# Patient Record
Sex: Male | Born: 1935 | Hispanic: No | Marital: Married | Smoking: Former smoker
Health system: Southern US, Community
[De-identification: ages and names within clinical notes are randomized; demographics above are authoritative.]

## PROBLEM LIST (undated history)

## (undated) DIAGNOSIS — M722 Plantar fascial fibromatosis: Secondary | ICD-10-CM

## (undated) DIAGNOSIS — Z974 Presence of external hearing-aid: Secondary | ICD-10-CM

## (undated) DIAGNOSIS — D499 Neoplasm of unspecified behavior of unspecified site: Secondary | ICD-10-CM

## (undated) DIAGNOSIS — I447 Left bundle-branch block, unspecified: Secondary | ICD-10-CM

## (undated) DIAGNOSIS — Z8619 Personal history of other infectious and parasitic diseases: Secondary | ICD-10-CM

## (undated) DIAGNOSIS — N529 Male erectile dysfunction, unspecified: Secondary | ICD-10-CM

## (undated) DIAGNOSIS — Z860100 Personal history of colon polyps, unspecified: Secondary | ICD-10-CM

## (undated) DIAGNOSIS — I10 Essential (primary) hypertension: Secondary | ICD-10-CM

## (undated) DIAGNOSIS — Z972 Presence of dental prosthetic device (complete) (partial): Secondary | ICD-10-CM

## (undated) DIAGNOSIS — C801 Malignant (primary) neoplasm, unspecified: Secondary | ICD-10-CM

## (undated) HISTORY — PX: ESOPHAGOGASTRODUODENOSCOPY: SHX1529

## (undated) HISTORY — PX: BLEPHAROPLASTY: SUR158

## (undated) HISTORY — DX: Essential (primary) hypertension: I10

## (undated) HISTORY — DX: Neoplasm of unspecified behavior of unspecified site: D49.9

## (undated) HISTORY — DX: Personal history of other infectious and parasitic diseases: Z86.19

## (undated) HISTORY — DX: Male erectile dysfunction, unspecified: N52.9

---

## 1969-03-20 HISTORY — PX: OTHER SURGICAL HISTORY: SHX169

## 2005-03-20 DIAGNOSIS — C801 Malignant (primary) neoplasm, unspecified: Secondary | ICD-10-CM

## 2005-03-20 HISTORY — DX: Malignant (primary) neoplasm, unspecified: C80.1

## 2005-10-06 ENCOUNTER — Ambulatory Visit: Payer: Self-pay | Admitting: Urology

## 2005-10-10 ENCOUNTER — Ambulatory Visit: Payer: Self-pay | Admitting: Urology

## 2005-11-16 ENCOUNTER — Ambulatory Visit: Payer: Self-pay | Admitting: Radiation Oncology

## 2007-07-19 IMAGING — CR DG LUMBAR SPINE AP/LAT/OBLIQUES W/ FLEX AND EXT
1 series · 5 of 5 positions shown · non-contrast
Comparison: none

REASON FOR EXAM: xray l-spine prostate ca abnormal  bone scan pt need film
COMMENTS:

[Series 1: view not recorded · 0.17mm/px · 5 of 5 slices shown]
[im 1/5]
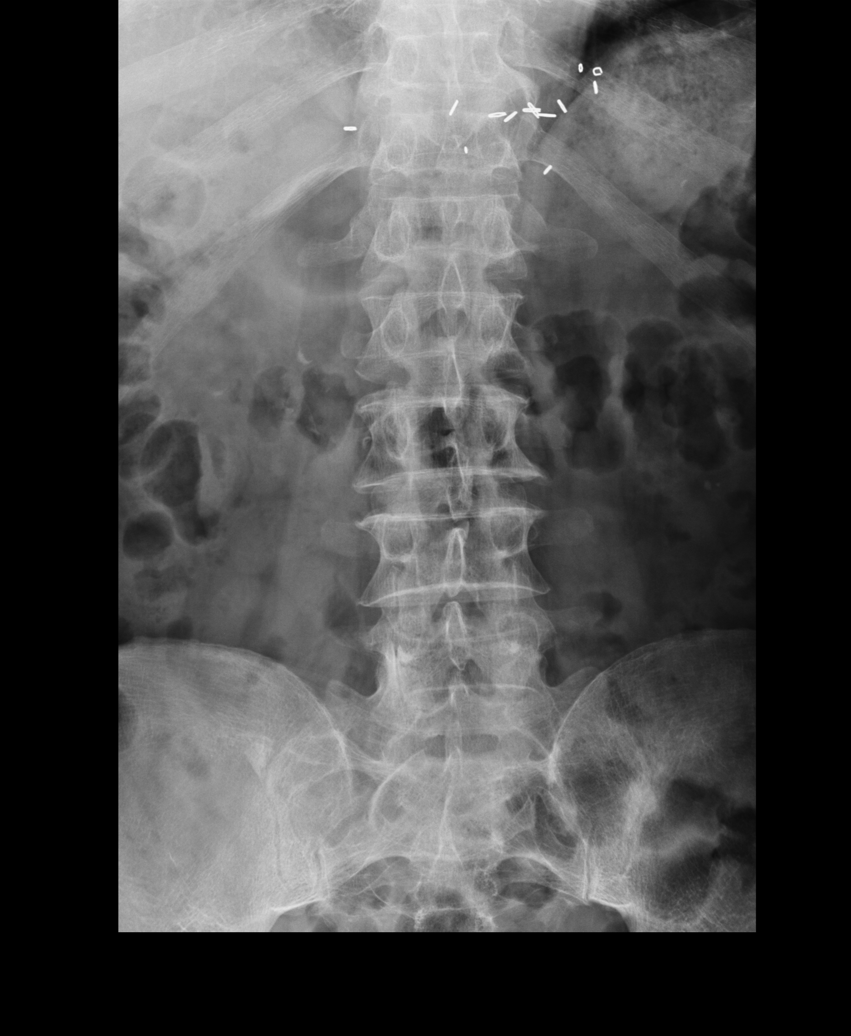
[im 2/5]
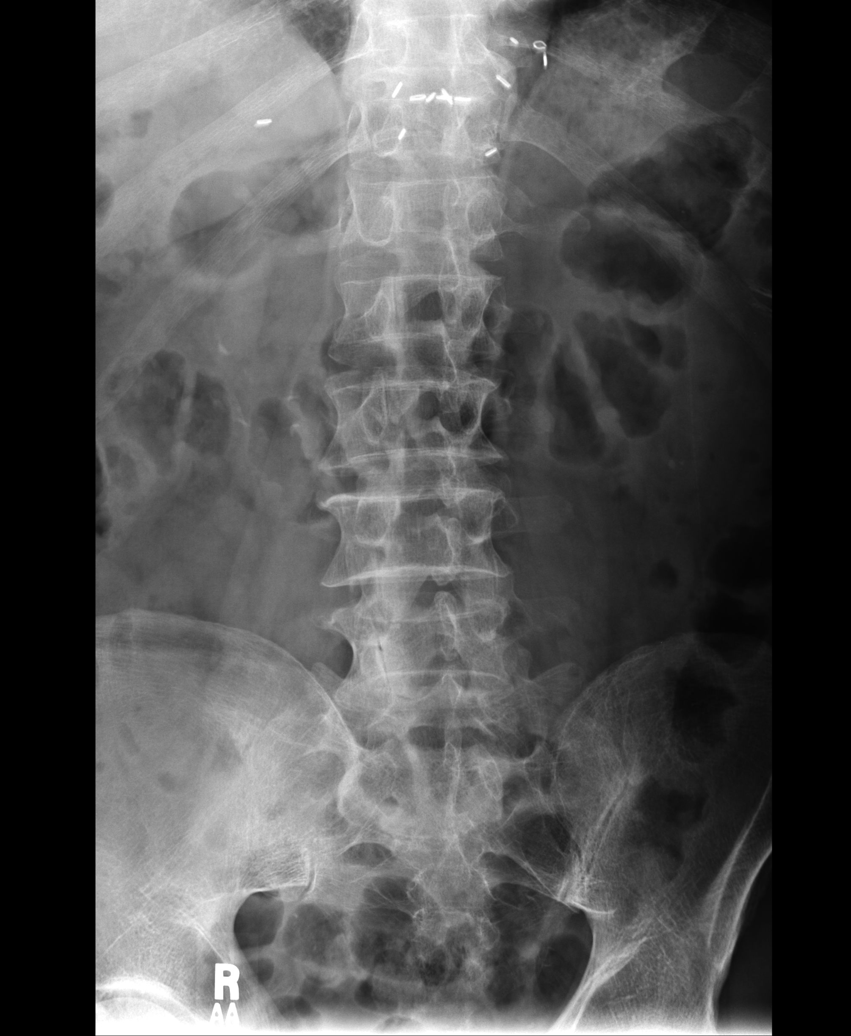
[im 3/5]
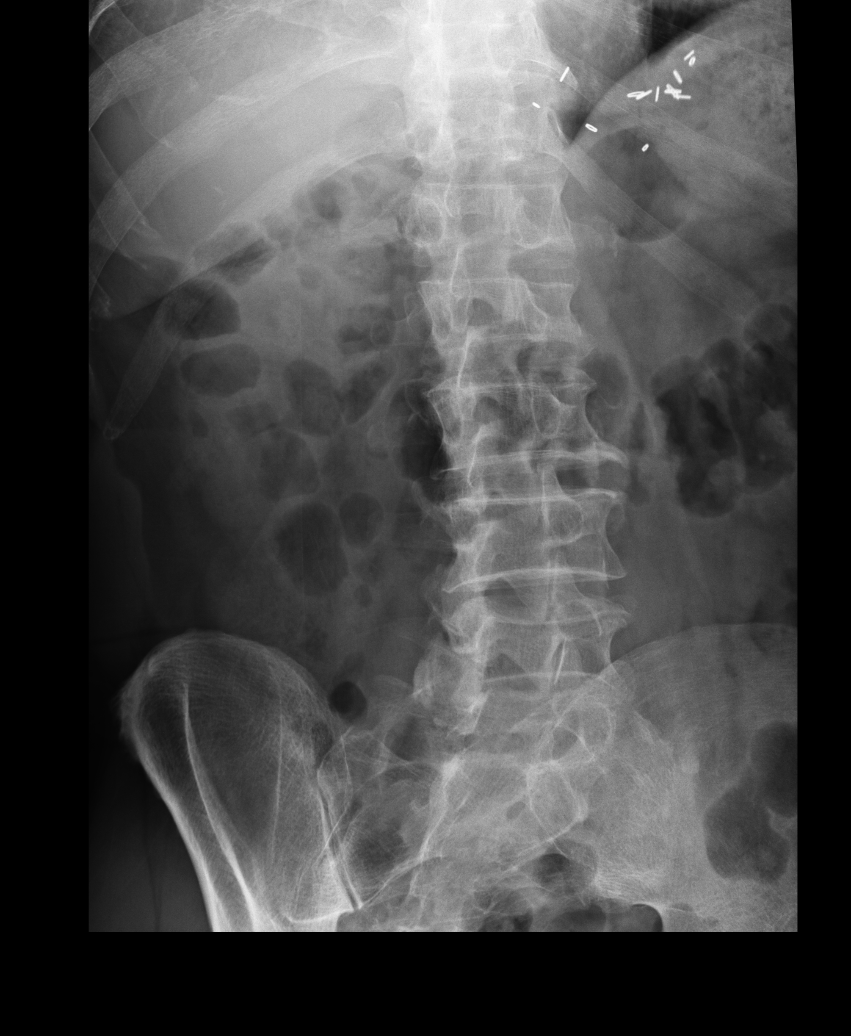
[im 4/5]
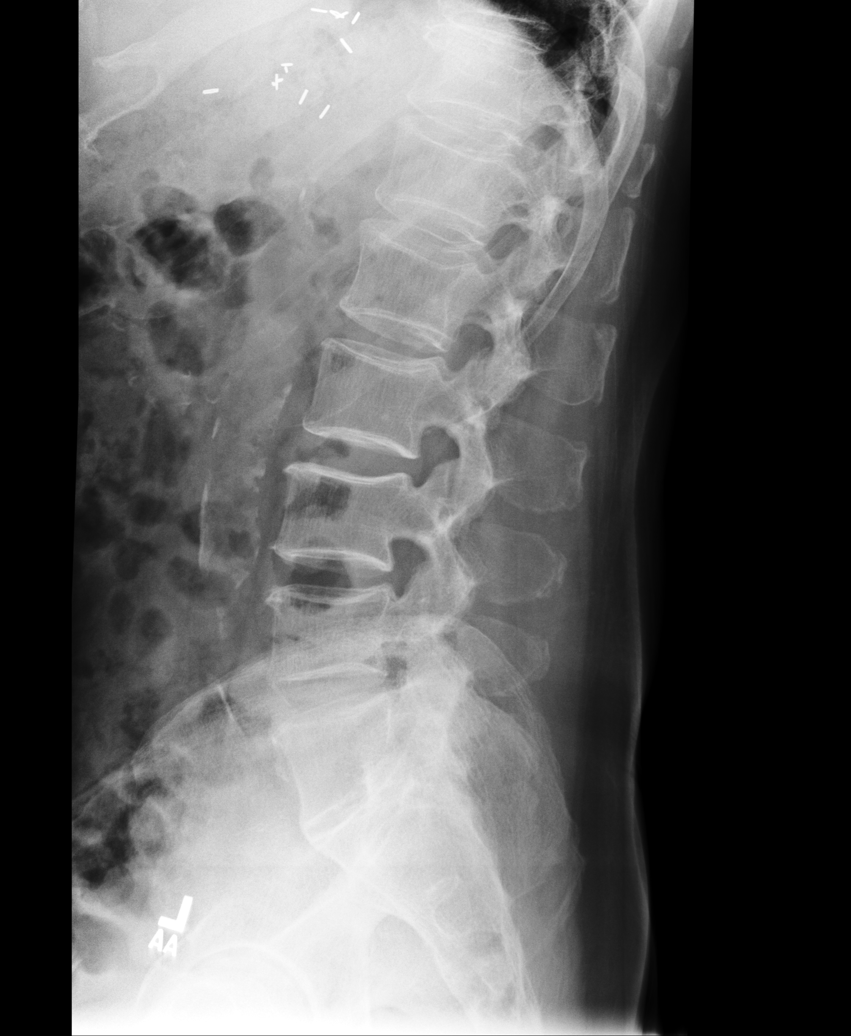
[im 5/5]
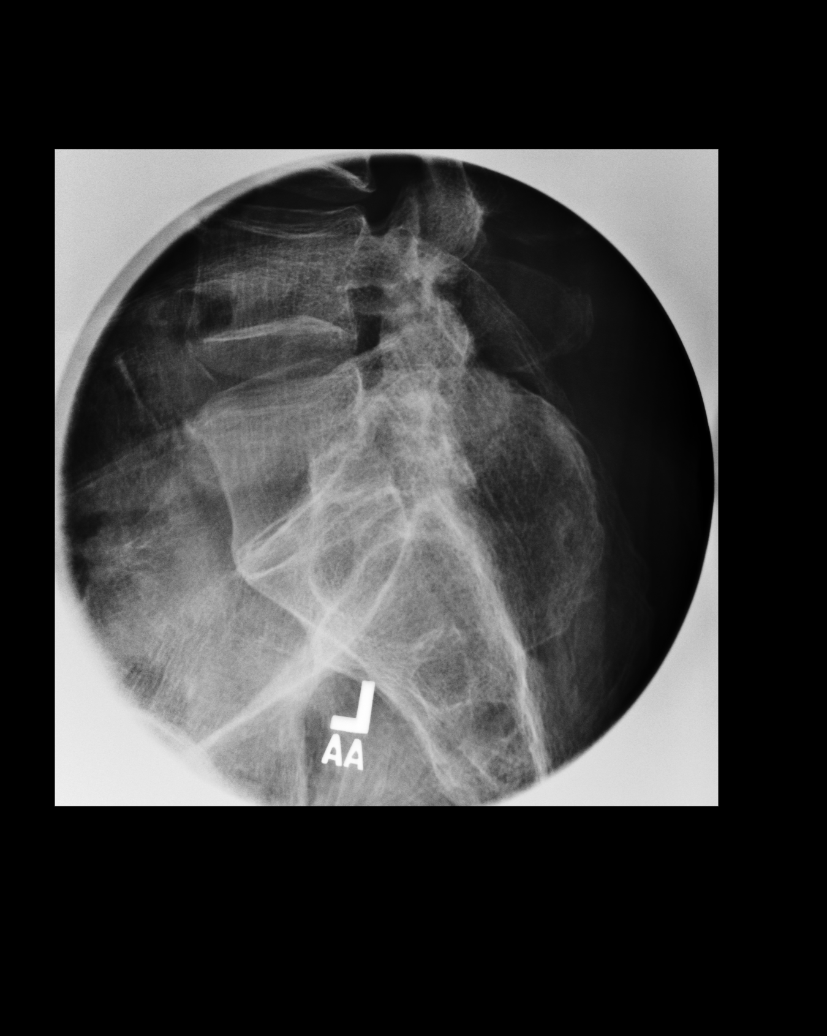

[5 of 5 positions shown; findings below may reference images not displayed]

PROCEDURE:     DXR - DXR LUMBAR SPINE WITH OBLIQUES  - October 10, 2005  [DATE]

RESULT:     AP, lateral and oblique views of the lumbar spine were obtained.
The vertebral body heights and the intervertebral disc spaces are well
maintained.  The vertebral body alignment is normal. There is slight
degenerative spurring anteriorly at multiple levels of the lumbar spine.
Oblique view shows no significant abnormalities of the articular facets.
Particular attention to the L5 level on the RIGHT shows no evidence of lytic
or blastic lesions in the area of increased tracer activity noted on recent
bone scan.
IMPRESSION: 1)No lytic or blastic lesions are identified.

2)There is slight degenerative spurring at multiple levels of the lumbar
spine.

3)No disc space narrowing is identified.

## 2007-09-17 ENCOUNTER — Ambulatory Visit: Payer: Self-pay | Admitting: Gastroenterology

## 2008-03-20 HISTORY — PX: CHOLECYSTECTOMY: SHX55

## 2008-03-20 HISTORY — PX: HERNIA REPAIR: SHX51

## 2008-04-20 HISTORY — PX: COLONOSCOPY: SHX5424

## 2008-05-12 ENCOUNTER — Ambulatory Visit: Payer: Self-pay | Admitting: General Surgery

## 2008-05-12 ENCOUNTER — Ambulatory Visit: Payer: Self-pay | Admitting: Internal Medicine

## 2008-05-13 ENCOUNTER — Inpatient Hospital Stay: Payer: Self-pay | Admitting: General Surgery

## 2008-05-25 ENCOUNTER — Ambulatory Visit: Payer: Self-pay | Admitting: General Surgery

## 2008-06-30 ENCOUNTER — Ambulatory Visit: Payer: Self-pay | Admitting: General Surgery

## 2008-07-03 ENCOUNTER — Ambulatory Visit: Payer: Self-pay | Admitting: General Surgery

## 2010-03-03 IMAGING — RF DG CHOLANGIOGRAM OPERATIVE
1 series · 13 of 13 positions shown · non-contrast
Comparison: none

REASON FOR EXAM: T Tube  Gallstones in Duct
COMMENTS:

[Series 1: run · 13 of 13 slices shown]
[im 1/13]
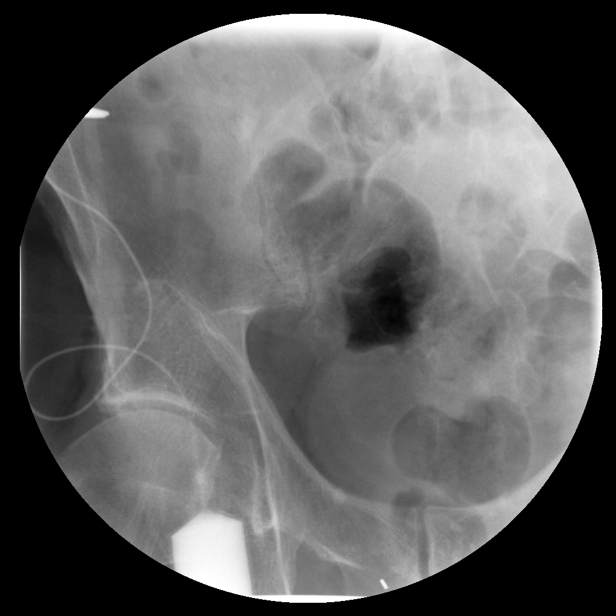
[im 2/13]
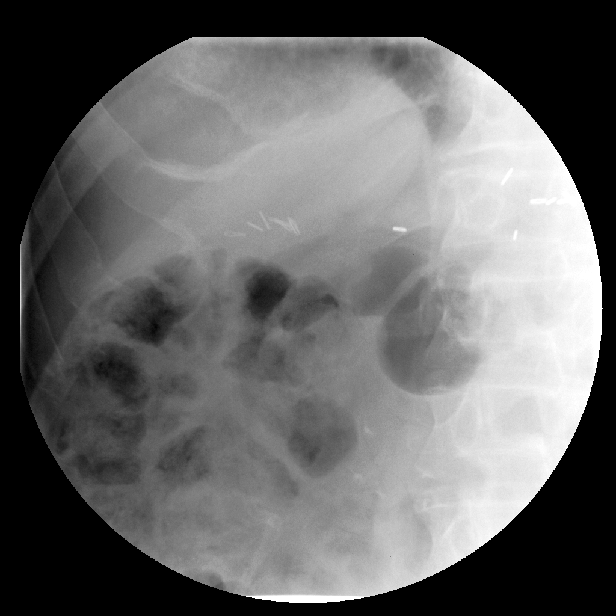
[im 3/13]
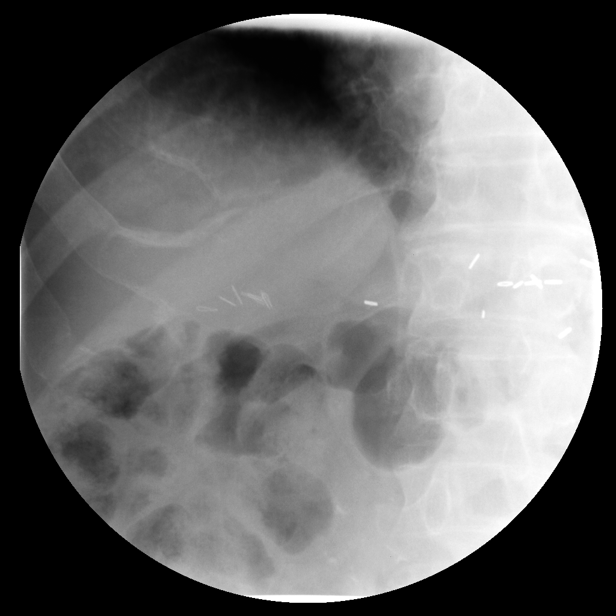
[im 4/13]
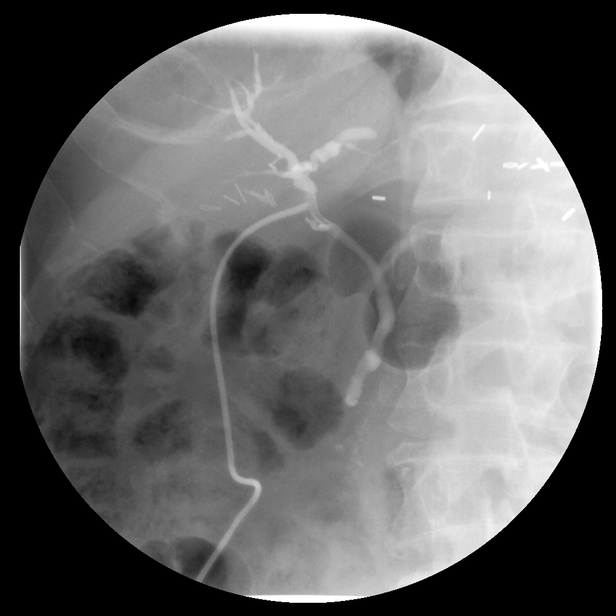
[im 5/13]
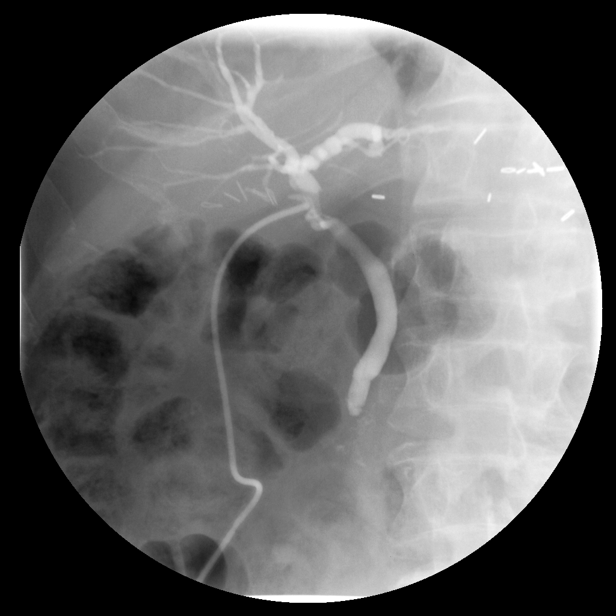
[im 6/13]
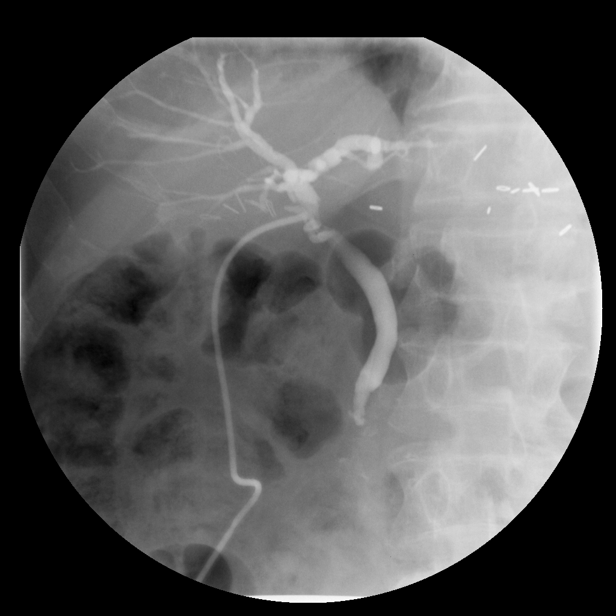
[im 7/13]
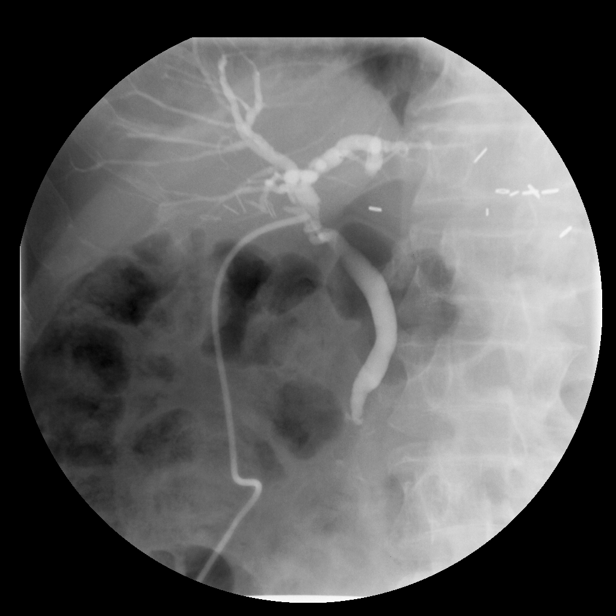
[im 8/13]
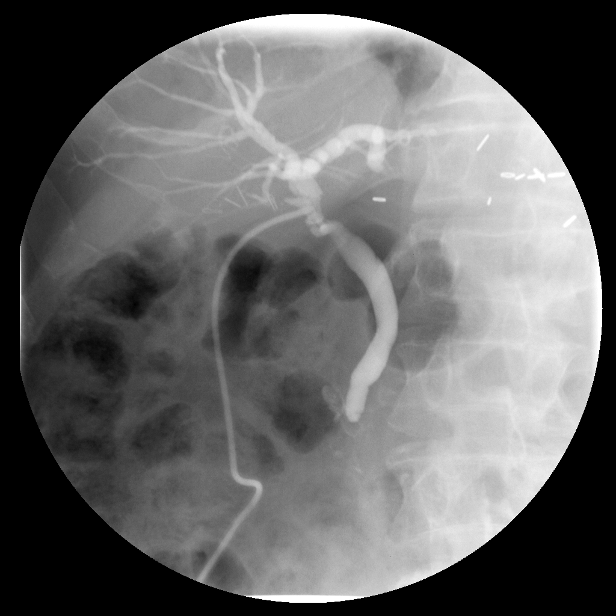
[im 9/13]
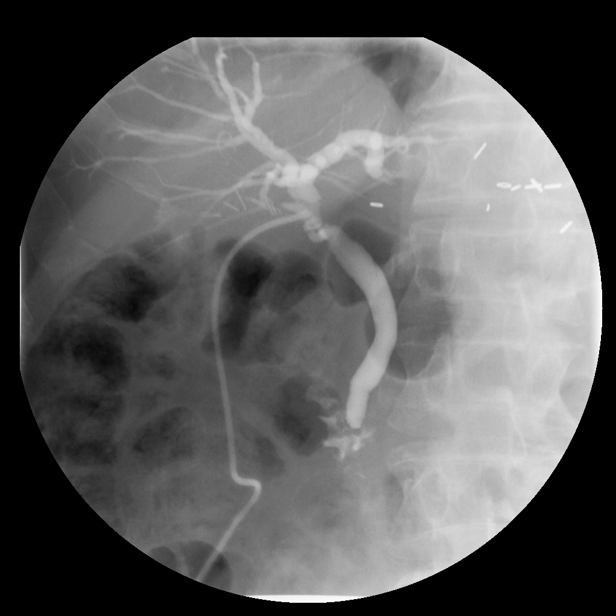
[im 10/13]
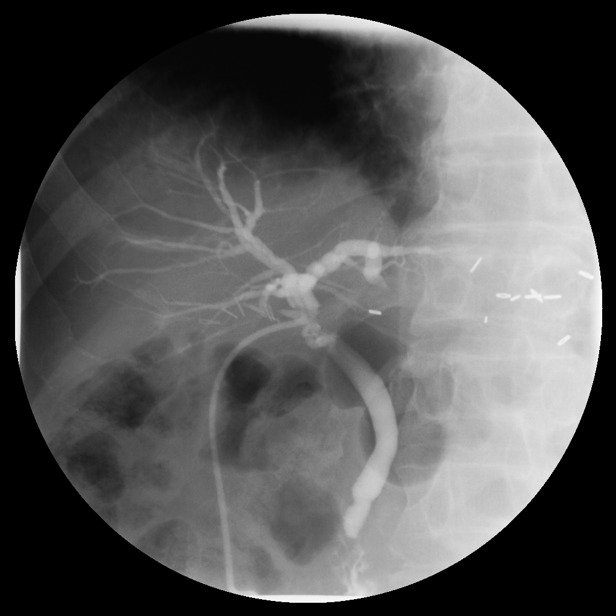
[im 11/13]
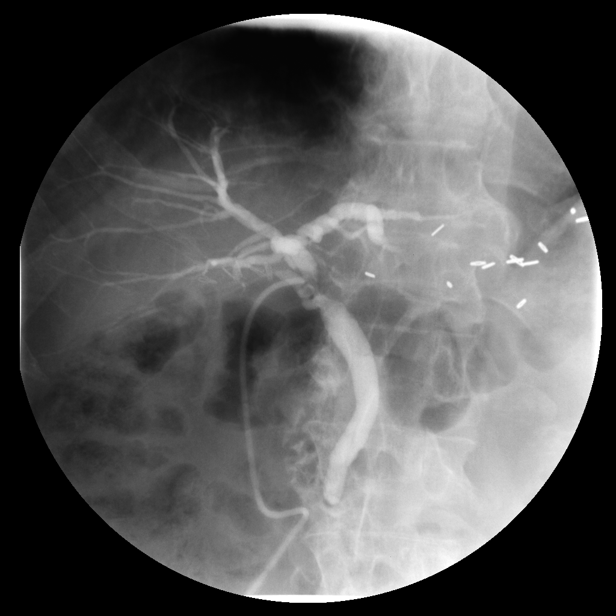
[im 12/13]
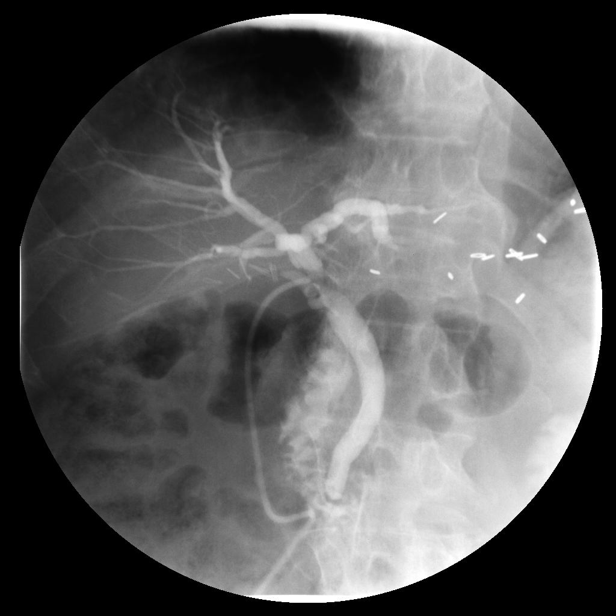
[im 13/13]
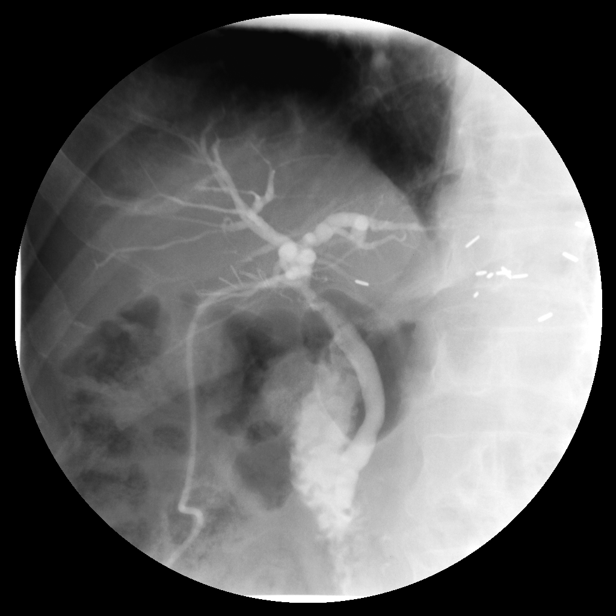

[13 of 13 positions shown; findings below may reference images not displayed]

PROCEDURE:     FL  - FL CHOLANGIOGRAM POST-OP T-TUBE  - May 25, 2008 [DATE]

RESULT:     Through the existing T-tube, diluted nonionic contrast was
advanced into the tube and into the biliary system. Scout film reveals
subtle calcifications in the right upper quadrant. Very tiny stone debris in
the distal common bile duct on subsequent Cholangiogram cannot be excluded.
No obstructing large stone is noted. Contrast empties into the duodenum.
There is no intrahepatic or extrahepatic biliary obstruction.
IMPRESSION: 1.     Cannot exclude very tiny calcific debris in the distal common bile
duct. No large obstructing stone is noted. There is no biliary distention.
Contrast empties into the duodenum easily.
2.     The patient has had a prior cholecystectomy.

## 2010-11-24 ENCOUNTER — Ambulatory Visit: Payer: Self-pay | Admitting: Unknown Physician Specialty

## 2010-11-24 LAB — HM COLONOSCOPY

## 2014-05-05 DIAGNOSIS — R05 Cough: Secondary | ICD-10-CM | POA: Diagnosis not present

## 2014-05-11 DIAGNOSIS — Z0000 Encounter for general adult medical examination without abnormal findings: Secondary | ICD-10-CM | POA: Diagnosis not present

## 2014-05-12 DIAGNOSIS — I10 Essential (primary) hypertension: Secondary | ICD-10-CM | POA: Diagnosis not present

## 2014-05-12 LAB — LIPID PANEL
Cholesterol: 132 mg/dL (ref 0–200)
HDL: 52 mg/dL (ref 35–70)
LDL Cholesterol: 64 mg/dL
Triglycerides: 82 mg/dL (ref 40–160)

## 2014-05-12 LAB — BASIC METABOLIC PANEL
BUN: 20 mg/dL (ref 4–21)
CREATININE: 1.1 mg/dL (ref 0.6–1.3)
Glucose: 101 mg/dL
POTASSIUM: 5.1 mmol/L (ref 3.4–5.3)
SODIUM: 140 mmol/L (ref 137–147)

## 2014-10-19 ENCOUNTER — Ambulatory Visit (INDEPENDENT_AMBULATORY_CARE_PROVIDER_SITE_OTHER): Payer: Commercial Managed Care - HMO | Admitting: Family Medicine

## 2014-10-19 ENCOUNTER — Encounter: Payer: Self-pay | Admitting: Family Medicine

## 2014-10-19 VITALS — BP 142/82 | HR 62 | Temp 97.7°F | Resp 16 | Wt 198.0 lb

## 2014-10-19 DIAGNOSIS — R609 Edema, unspecified: Secondary | ICD-10-CM | POA: Diagnosis not present

## 2014-10-19 DIAGNOSIS — Z86010 Personal history of colonic polyps: Secondary | ICD-10-CM | POA: Insufficient documentation

## 2014-10-19 DIAGNOSIS — Z8546 Personal history of malignant neoplasm of prostate: Secondary | ICD-10-CM

## 2014-10-19 DIAGNOSIS — I10 Essential (primary) hypertension: Secondary | ICD-10-CM

## 2014-10-19 DIAGNOSIS — M722 Plantar fascial fibromatosis: Secondary | ICD-10-CM | POA: Insufficient documentation

## 2014-10-19 DIAGNOSIS — L219 Seborrheic dermatitis, unspecified: Secondary | ICD-10-CM | POA: Insufficient documentation

## 2014-10-19 DIAGNOSIS — N529 Male erectile dysfunction, unspecified: Secondary | ICD-10-CM | POA: Insufficient documentation

## 2014-10-19 HISTORY — DX: Plantar fascial fibromatosis: M72.2

## 2014-10-19 NOTE — Progress Notes (Signed)
       Patient: Dennis Cooper Male    DOB: 12-11-1932   81 y.o.   MRN: [id number] Visit Date: 10/19/2014  Today's Provider: Lelon Huh, MD   Chief Complaint  Patient presents with  . Edema    x 4 weeks   Subjective:    HPI   Swelling in right leg: Patient states he has been having swelling in his right leg for 4-5 weeks. Patient states he feels that the swelling is getting worse. Swelling worsens throughout the day. Patient noticed that the swelling decreases every morning. Patient states his right leg feels heavy and sometimes has a tingling sensation. Patient denies any pain in his right leg. Patient also denies, chest pain, shortness of breath, fatigue or heart palpitations. It has not been painful at all. Swelling goes down overnight. No injuries and no new medications.     No Known Allergies Previous Medications   AMLODIPINE (NORVASC) 5 MG TABLET    Take 5 mg by mouth daily.   ASPIRIN EC 81 MG TABLET    Take 81 mg by mouth daily.   LOSARTAN-HYDROCHLOROTHIAZIDE (HYZAAR) 100-25 MG PER TABLET    Take 1 tablet by mouth daily.   MULTIPLE VITAMINS-MINERALS (MULTIVITAMIN ADULT PO)    Take 1 tablet by mouth daily.   NEOMYCIN-POLYMYXIN-HYDROCORTISONE (CORTISPORIN) 1 % SOLN OTIC SOLUTION    4 drops every 6 (six) hours as needed (in affected ear).   TADALAFIL (CIALIS) 5 MG TABLET    Take 10 mg by mouth as needed for erectile dysfunction (no more than 2 tablets in a day).    Review of Systems  Constitutional: Negative for fever, chills, diaphoresis, activity change, appetite change and fatigue.  Respiratory: Negative for apnea, cough, choking, chest tightness, shortness of breath, wheezing and stridor.   Cardiovascular: Negative for chest pain and palpitations. Leg swelling: right leg.  Neurological: Positive for weakness (in right leg) and numbness (tingling sensation in right leg). Negative for dizziness, tremors, seizures, syncope, facial asymmetry, speech difficulty,  light-headedness and headaches.    History  Substance Use Topics  . Smoking status: Former Smoker    Types: Cigarettes    Quit date: 03/20/1978  . Smokeless tobacco: Not on file  . Alcohol Use: 0.0 oz/week    0 Standard drinks or equivalent per week     Comment: Moderate use; drinks beer   Objective:   BP 142/82 mmHg  Pulse 62  Temp(Src) 97.7 F (36.5 C) (Oral)  Resp 16  Wt 198 lb (89.812 kg)  SpO2 96%  Physical Exam  General Appearance:    Alert, cooperative, no distress  Eyes:    PERRL, conjunctiva/corneas clear, EOM's intact       Lungs:     Clear to auscultation bilaterally, respirations unlabored  Heart:    Regular rate and rhythm  Neurologic:   Awake, alert, oriented x 3. No apparent focal neurological           defect.   Ext:   Slight swelling of right LE compared to left. No cords. Mild tenderness right popliteal fossae.         Assessment & Plan:     1. Edema Likely venous stasis. He soes have a bit of tenderness right popliteal fossae. Need to rule out DVT.  - US Venous Img Lower Unilateral Right; Future       Lelon Huh, MD  [facility]

## 2014-10-20 ENCOUNTER — Ambulatory Visit
Admission: RE | Admit: 2014-10-20 | Discharge: 2014-10-20 | Disposition: A | Discharge status: Home / Self Care | Payer: Commercial Managed Care - HMO | Source: Ambulatory Visit | Attending: Family Medicine | Admitting: Family Medicine

## 2014-10-20 ENCOUNTER — Telehealth: Payer: Self-pay | Admitting: *Deleted

## 2014-10-20 DIAGNOSIS — M7989 Other specified soft tissue disorders: Secondary | ICD-10-CM | POA: Diagnosis not present

## 2014-10-20 DIAGNOSIS — R609 Edema, unspecified: Secondary | ICD-10-CM | POA: Insufficient documentation

## 2014-10-20 DIAGNOSIS — M79604 Pain in right leg: Secondary | ICD-10-CM | POA: Diagnosis not present

## 2014-10-20 NOTE — Telephone Encounter (Signed)
[facility] imaging of with [place] results; Neg for DVT of right leg.

## 2014-11-04 ENCOUNTER — Ambulatory Visit (INDEPENDENT_AMBULATORY_CARE_PROVIDER_SITE_OTHER): Payer: Commercial Managed Care - HMO | Admitting: Family Medicine

## 2014-11-04 ENCOUNTER — Encounter: Payer: Self-pay | Admitting: Family Medicine

## 2014-11-04 VITALS — BP 122/78 | HR 62 | Temp 97.9°F | Resp 18 | Ht 69.25 in | Wt 197.0 lb

## 2014-11-04 DIAGNOSIS — R600 Localized edema: Secondary | ICD-10-CM

## 2014-11-04 NOTE — Progress Notes (Signed)
       Patient: Dennis Cooper Male    DOB: 04-15-1932   81 y.o.   MRN: [id number] Visit Date: 11/04/2014  Today's Provider: Lelon Huh, MD   Chief Complaint  Patient presents with  . Leg Swelling   Subjective:    HPI Leg swelling: Patient was last seen in the office on 10/19/2014 for edema of lower right leg. Management at that visit includes ordering an ultrasound which was negative for DVT. Patient was advised to exercise and walk and to keep legs elevated above waist. He is very active and [place] He states elevating his like does bring the swelling down, but is concerned this may cause problems with his hike. He remains very active and states he hiked about 5 miles last weekend. His leg didn't bother him during the hike, but swelled quite a bit after words.      No Known Allergies Previous Medications   AMLODIPINE (NORVASC) 5 MG TABLET    Take 5 mg by mouth daily.   ASPIRIN EC 81 MG TABLET    Take 81 mg by mouth daily.   LOSARTAN-HYDROCHLOROTHIAZIDE (HYZAAR) 100-25 MG PER TABLET    Take 1 tablet by mouth daily.   MULTIPLE VITAMINS-MINERALS (MULTIVITAMIN ADULT PO)    Take 1 tablet by mouth daily.   NEOMYCIN-POLYMYXIN-HYDROCORTISONE (CORTISPORIN) 1 % SOLN OTIC SOLUTION    4 drops every 6 (six) hours as needed (in affected ear).   TADALAFIL (CIALIS) 5 MG TABLET    Take 10 mg by mouth as needed for erectile dysfunction (no more than 2 tablets in a day).    Review of Systems  Respiratory: Negative for chest tightness, shortness of breath and wheezing.   Cardiovascular: Positive for leg swelling (right  lower leg).  Musculoskeletal: Negative for back pain and arthralgias.  Neurological: Positive for weakness (in right lower leg after exercise and walking).    Social History  Substance Use Topics  . Smoking status: Former Smoker    Types: Cigarettes    Quit date: 03/20/1978  . Smokeless tobacco: Not on file  . Alcohol Use: 0.0 oz/week    0  Standard drinks or equivalent per week     Comment: Moderate use; drinks beer   Objective:   BP 122/78 mmHg  Pulse 62  Temp(Src) 97.9 F (36.6 C) (Oral)  Resp 18  Ht 5' 9.25" (1.759 m)  Wt 197 lb (89.359 kg)  BMI 28.88 kg/m2  SpO2 96%  Physical Exam General appearance: alert, well developed, well nourished, cooperative and in no distress Head: Normocephalic, without obvious abnormality, atraumatic Lungs: Respirations even and unlabored Extremities: No gross deformities. 1+ right LE edema. No cords. No erythema.  Skin: Skin color, texture, turgor normal. No rashes seen  Psych: Appropriate mood and affect. Neurologic: Mental status: Alert, oriented to person, place, and time, thought content appropriate.     Assessment & Plan:     1. Edema of right lower extremity Ruled out DVT. Likely some mild venous stasis and responds well to leg elevation. He is very concerned this may impair [place] ability [place]. We discussed mechanical interventions such as compression stocking. He is interested in getting further vascular evaluation to make sure condition is optimally treated before he embarks next spring.   - Ambulatory referral to Vascular Surgery        Lelon Huh, MD  [facility]

## 2014-11-19 DIAGNOSIS — M79609 Pain in unspecified limb: Secondary | ICD-10-CM | POA: Diagnosis not present

## 2014-11-19 DIAGNOSIS — I872 Venous insufficiency (chronic) (peripheral): Secondary | ICD-10-CM | POA: Diagnosis not present

## 2014-11-19 DIAGNOSIS — I890 Lymphedema, not elsewhere classified: Secondary | ICD-10-CM | POA: Diagnosis not present

## 2014-11-19 DIAGNOSIS — M7989 Other specified soft tissue disorders: Secondary | ICD-10-CM | POA: Diagnosis not present

## 2015-02-04 ENCOUNTER — Ambulatory Visit (INDEPENDENT_AMBULATORY_CARE_PROVIDER_SITE_OTHER): Payer: Commercial Managed Care - HMO

## 2015-02-04 DIAGNOSIS — Z23 Encounter for immunization: Secondary | ICD-10-CM

## 2015-03-02 DIAGNOSIS — C61 Malignant neoplasm of prostate: Secondary | ICD-10-CM | POA: Diagnosis not present

## 2015-03-25 ENCOUNTER — Other Ambulatory Visit: Payer: Self-pay | Admitting: Family Medicine

## 2015-04-15 DIAGNOSIS — I890 Lymphedema, not elsewhere classified: Secondary | ICD-10-CM | POA: Diagnosis not present

## 2015-04-15 DIAGNOSIS — I872 Venous insufficiency (chronic) (peripheral): Secondary | ICD-10-CM | POA: Diagnosis not present

## 2015-04-15 DIAGNOSIS — M7989 Other specified soft tissue disorders: Secondary | ICD-10-CM | POA: Diagnosis not present

## 2015-04-15 DIAGNOSIS — M79609 Pain in unspecified limb: Secondary | ICD-10-CM | POA: Diagnosis not present

## 2015-05-10 ENCOUNTER — Ambulatory Visit (INDEPENDENT_AMBULATORY_CARE_PROVIDER_SITE_OTHER): Payer: Commercial Managed Care - HMO | Admitting: Family Medicine

## 2015-05-10 ENCOUNTER — Encounter: Payer: Self-pay | Admitting: Family Medicine

## 2015-05-10 VITALS — BP 142/72 | HR 72 | Temp 98.3°F | Resp 16 | Wt 186.0 lb

## 2015-05-10 DIAGNOSIS — J0110 Acute frontal sinusitis, unspecified: Secondary | ICD-10-CM

## 2015-05-10 MED ORDER — AZITHROMYCIN 250 MG PO TABS: ORAL_TABLET | ORAL | Status: AC

## 2015-05-10 NOTE — Progress Notes (Signed)
Patient ID: REXALL MAYBANK, male   DOB: [birth year], 82 y.o.   MRN: [id number]       Patient: BERMAN GURSKI Male    DOB: 01/16/1933   82 y.o.   MRN: [id number] Visit Date: 05/10/2015  Today's Provider: Lelon Huh, MD   Chief Complaint  Patient presents with  . URI    X 2 days.    Subjective:    URI  This is a new problem. The current episode started in the past 7 days. The problem has been gradually worsening. There has been no fever. Associated symptoms include congestion, coughing, headaches, a plugged ear sensation, rhinorrhea, sinus pain and sneezing. He has tried decongestant for the symptoms. The treatment provided no relief.       No Known Allergies Previous Medications   AMLODIPINE (NORVASC) 5 MG TABLET    Take 5 mg by mouth daily.   ASPIRIN EC 81 MG TABLET    Take 81 mg by mouth daily.   LOSARTAN-HYDROCHLOROTHIAZIDE (HYZAAR) 100-25 MG PER TABLET    Take 1 tablet by mouth daily.   MULTIPLE VITAMINS-MINERALS (MULTIVITAMIN ADULT PO)    Take 1 tablet by mouth daily.   NEOMYCIN-POLYMYXIN-HYDROCORTISONE (CORTISPORIN) 1 % SOLN OTIC SOLUTION    INSTILL 4 DROPS INTO AFFECTED EAR EVERY 6 HOURS AS NEEDED   TADALAFIL (CIALIS) 5 MG TABLET    Take 10 mg by mouth as needed for erectile dysfunction (no more than 2 tablets in a day).    Review of Systems  Constitutional: Positive for chills, activity change and fatigue.  HENT: Positive for congestion, postnasal drip, rhinorrhea, sinus pressure and sneezing.   Respiratory: Positive for cough.   Neurological: Positive for headaches.    Social History  Substance Use Topics  . Smoking status: Former Smoker    Types: Cigarettes    Quit date: 03/20/1978  . Smokeless tobacco: Not on file  . Alcohol Use: 0.0 oz/week    0 Standard drinks or equivalent per week     Comment: Moderate use; drinks beer   Objective:   BP 142/72 mmHg  Pulse 72  Temp(Src) 98.3 F (36.8 C)  Resp 16  Wt 186 lb (84.369 kg)  Physical Exam  General  Appearance:    Alert, cooperative, no distress  HENT:   bilateral TM normal without fluid or infection, neck without nodes, throat normal without erythema or exudate, frontal  sinus tender and nasal mucosa congested  Eyes:    PERRL, conjunctiva/corneas clear, EOM's intact       Lungs:     Clear to auscultation bilaterally, respirations unlabored  Heart:    Regular rate and rhythm  Neurologic:   Awake, alert, oriented x 3. No apparent focal neurological           defect.           Assessment & Plan:     1. Acute frontal sinusitis, recurrence not specified  - azithromycin (ZITHROMAX) 250 MG tablet; 2 by mouth today, then 1 daily for 4 days  Dispense: 6 tablet; Refill: 0  Call if symptoms change or if not rapidly improving.          Lelon Huh, MD  [facility]

## 2015-05-10 NOTE — Patient Instructions (Signed)
OTC Mucinex for head and chest congestion

## 2015-05-13 ENCOUNTER — Ambulatory Visit (INDEPENDENT_AMBULATORY_CARE_PROVIDER_SITE_OTHER): Payer: Medicare HMO | Admitting: Family Medicine

## 2015-05-13 ENCOUNTER — Encounter: Payer: Self-pay | Admitting: Family Medicine

## 2015-05-13 VITALS — BP 124/80 | HR 57 | Temp 97.8°F | Resp 16 | Ht 69.25 in | Wt 180.0 lb

## 2015-05-13 DIAGNOSIS — Z0000 Encounter for general adult medical examination without abnormal findings: Secondary | ICD-10-CM | POA: Diagnosis not present

## 2015-05-13 DIAGNOSIS — J101 Influenza due to other identified influenza virus with other respiratory manifestations: Secondary | ICD-10-CM | POA: Diagnosis not present

## 2015-05-13 DIAGNOSIS — H6091 Unspecified otitis externa, right ear: Secondary | ICD-10-CM

## 2015-05-13 DIAGNOSIS — R509 Fever, unspecified: Secondary | ICD-10-CM

## 2015-05-13 DIAGNOSIS — I10 Essential (primary) hypertension: Secondary | ICD-10-CM

## 2015-05-13 LAB — POCT INFLUENZA A/B
INFLUENZA A, POC: POSITIVE — AB
INFLUENZA B, POC: NEGATIVE

## 2015-05-13 MED ORDER — NEOMYCIN-POLYMYXIN-HC 1 % OT SOLN: OTIC | Status: DC

## 2015-05-13 MED ORDER — OSELTAMIVIR PHOSPHATE 75 MG PO CAPS: 75.0000 mg | ORAL_CAPSULE | Freq: Two times a day (BID) | ORAL | Status: AC

## 2015-05-13 NOTE — Progress Notes (Deleted)
       Patient: Dennis Cooper Male    DOB: 08-Apr-1932   82 y.o.   MRN: [id number] Visit Date: 05/13/2015  Today's Provider: Lelon Huh, MD   No chief complaint on file.  Subjective:    HPI     Hypertension, follow-up:  BP Readings from Last 3 Encounters:  05/10/15 142/72  11/04/14 122/78  10/19/14 142/82    He was last seen for hypertension 1 years ago.  BP at that visit was 140/72. Management since that visit includes; no changes.He reports {excellent/good/fair/poor:19665} compliance with treatment. He {ACTION; IS/IS [id number] having side effects. *** He {is/is not:9024} exercising. He {is/is not:9024} adherent to low salt diet.   Outside blood pressures are ***. He is experiencing {Symptoms; cardiac:12860}.  Patient denies {Symptoms; cardiac:12860}.   Cardiovascular risk factors include {cv risk factors:510}.  Use of agents associated with hypertension: {bp agents assoc with hypertension:511::"none"}.   ----------------------------------------------------------------------     No Known Allergies Previous Medications   AMLODIPINE (NORVASC) 5 MG TABLET    Take 5 mg by mouth daily.   ASPIRIN EC 81 MG TABLET    Take 81 mg by mouth daily.   AZITHROMYCIN (ZITHROMAX) 250 MG TABLET    2 by mouth today, then 1 daily for 4 days   LOSARTAN-HYDROCHLOROTHIAZIDE (HYZAAR) 100-25 MG PER TABLET    Take 1 tablet by mouth daily.   MULTIPLE VITAMINS-MINERALS (MULTIVITAMIN ADULT PO)    Take 1 tablet by mouth daily.   NEOMYCIN-POLYMYXIN-HYDROCORTISONE (CORTISPORIN) 1 % SOLN OTIC SOLUTION    INSTILL 4 DROPS INTO AFFECTED EAR EVERY 6 HOURS AS NEEDED   TADALAFIL (CIALIS) 5 MG TABLET    Take 10 mg by mouth as needed for erectile dysfunction (no more than 2 tablets in a day).    Review of Systems  Social History  Substance Use Topics  . Smoking status: Former Smoker    Types: Cigarettes    Quit date: 03/20/1978  . Smokeless tobacco: Not on file  . Alcohol Use: 0.0 oz/week      0 Standard drinks or equivalent per week     Comment: Moderate use; drinks beer   Objective:   There were no vitals taken for this visit.  Physical Exam      Assessment & Plan:           Lelon Huh, MD  [facility]

## 2015-05-13 NOTE — Progress Notes (Signed)
Patient: Dennis Cooper, Male    DOB: 09-Dec-1932, 82 y.o.   MRN: [id number] Visit Date: 05/13/2015  Today's Provider: Lelon Huh, MD   Chief Complaint  Patient presents with  . Annual Exam  . Hypertension   Subjective:    Annual physical  Dennis Cooper is a 82 y.o. male. He feels poorly. He reports exercising yes. He reports he is sleeping well.  He states he continues yearly follow up at [facility] for follow up of prostate cancer and has PSA checked there.   -----------------------------------------------------------  He was seen on 05/10/15 with sinus congestion and pressure. He was started on Z-pack. He states that yesterday he had extreme fatigue and muscle aches and felt feverish He feels a little better today. He did have flu shot in November.   Review of Systems  Constitutional: Positive for fever, chills, diaphoresis, activity change, appetite change and fatigue.  HENT: Positive for congestion, ear pain, hearing loss, rhinorrhea, sinus pressure and sneezing.   Eyes: Negative.   Respiratory: Positive for cough.   Cardiovascular: Positive for leg swelling.  Gastrointestinal: Negative.   Endocrine: Negative.   Genitourinary: Positive for urgency.  Musculoskeletal: Positive for myalgias, back pain and neck stiffness.  Skin: Negative.   Allergic/Immunologic: Negative.   Neurological: Positive for dizziness and light-headedness.  Hematological: Negative.   Psychiatric/Behavioral: Positive for sleep disturbance. The patient is nervous/anxious.     Social History   Social History  . Marital Status: Married    Spouse Name: N/A  . Number of Children: N/A  . Years of Education: N/A   Occupational History  . Not on file.   Social History Main Topics  . Smoking status: Former Smoker    Types: Cigarettes    Quit date: 03/20/1978  . Smokeless tobacco: Not on file  . Alcohol Use: 0.0 oz/week    0 Standard drinks or equivalent per week     Comment: Moderate use;  drinks beer  . Drug Use: No  . Sexual Activity: Not on file   Other Topics Concern  . Not on file   Social History Narrative    Past Medical History  Diagnosis Date  . History of chicken pox   . History of measles as a child   . Hypertension   . Erectile dysfunction      Patient Active Problem List   Diagnosis Date Noted  . Prostate cancer ([place]) 10/19/2014  . Hypertension 10/19/2014  . Erectile dysfunction 10/19/2014  . History of colon polyps 10/19/2014  . Plantar fasciitis 10/19/2014  . Seborrhea 10/19/2014    Past Surgical History  Procedure Laterality Date  . Hernia repair  2010    inguinal; Dr. Jamal Collin  . Cholecystectomy  2010    Dr. Jamal Collin  . Stomach ulcer  1971    His family history includes Arthritis in his brother and sister; Congestive Heart Failure in his brother; Diabetes in his brother, sister, and sister; Heart attack in his sister; Hypertension in his brother and sister.    Previous Medications   AMLODIPINE (NORVASC) 5 MG TABLET    Take 5 mg by mouth daily.   ASPIRIN EC 81 MG TABLET    Take 81 mg by mouth daily.   AZITHROMYCIN (ZITHROMAX) 250 MG TABLET    2 by mouth today, then 1 daily for 4 days   LOSARTAN-HYDROCHLOROTHIAZIDE (HYZAAR) 100-25 MG PER TABLET    Take 1 tablet by mouth daily.   MULTIPLE VITAMINS-MINERALS (MULTIVITAMIN  ADULT PO)    Take 1 tablet by mouth daily.   TADALAFIL (CIALIS) 5 MG TABLET    Take 10 mg by mouth as needed for erectile dysfunction (no more than 2 tablets in a day).    Patient Care Team: Birdie Sons, MD as PCP - General (Family Medicine)     Objective:   Vitals: BP 124/80 mmHg  Pulse 57  Temp(Src) 97.8 F (36.6 C) (Oral)  Resp 16  Ht 5' 9.25" (1.759 m)  Wt 180 lb (81.647 kg)  BMI 26.39 kg/m2  SpO2 98%  Physical Exam   General Appearance:    Alert, cooperative, no distress, appears stated age  Head:    Normocephalic, without obvious abnormality, atraumatic  Eyes:    PERRL, conjunctiva/corneas clear,  EOM's intact, fundi    benign, both eyes       Ears:    Normal TM's right ear canal red and irritated  Nose:   Nares normal, septum midline, mucosa normal, no drainage   or sinus tenderness  Throat:   Lips, mucosa, and tongue normal; teeth and gums normal  Neck:   Supple, symmetrical, trachea midline, no adenopathy;       thyroid:  No enlargement/tenderness/nodules; no carotid   bruit or JVD  Back:     Symmetric, no curvature, ROM normal, no CVA tenderness  Lungs:     Clear to auscultation bilaterally, respirations unlabored  Chest wall:    No tenderness or deformity  Heart:    Regular rate and rhythm, S1 and S2 normal, no murmur, rub   or gallop  Abdomen:     Soft, non-tender, bowel sounds active all four quadrants,    no masses, no organomegaly  Genitalia:    deferred  Rectal:    deferred  Extremities:   Extremities normal, atraumatic, no cyanosis or edema  Pulses:   2+ and symmetric all extremities  Skin:   Skin color, texture, turgor normal, no rashes or lesions  Lymph nodes:   Cervical, supraclavicular, and axillary nodes normal  Neurologic:   CNII-XII intact. Normal strength, sensation and reflexes      throughout    Activities of Daily Living In your present state of health, do you have any difficulty performing the following activities: 05/13/2015  Hearing? Y  Vision? N  Difficulty concentrating or making decisions? N  Walking or climbing stairs? N  Dressing or bathing? N  Doing errands, shopping? N    Fall Risk Assessment Fall Risk  05/13/2015  Falls in the past year? No     Depression Screen PHQ 2/9 Scores 05/13/2015  PHQ - 2 Score 0    Cognitive Testing - 6-CIT  Correct? Score   What year is it? yes 0 0 or 4  What month is it? yes 0 0 or 3  Memorize:    [place]ia [place],  44,  [place],      What time is it? (within 1 hour) yes 0 0 or 3  Count backwards from 20 yes 0 0, 2, or 4  Name the months of the year yes 0 0, 2, or 4  Repeat name & address  above yes 3 0, 2, 4, 6, 8, or 10       TOTAL SCORE  3/28   Interpretation:  Normal  Normal (0-7) Abnormal (8-28)    Audit-C Alcohol Use Screening  Question Answer Points  How often do you have alcoholic drink? 2-3 times weekly 3  On days you do drink alcohol, how many drinks do you typically consume? n/a 0  How oftey will you drink 6 or more in a total? never 0  Total Score:  3   A score of 3 or more in women, and 4 or more in men indicates increased risk for alcohol abuse, EXCEPT if all of the points are from question 1.     Assessment & Plan:     Physical  Reviewed patient's Family Medical History Reviewed and updated list of patient's medical providers Assessment of cognitive impairment was done Assessed patient's functional ability Established a written schedule for health screening [place] Completed and Reviewed  Exercise Activities and Dietary recommendations Goals    None      Immunization History  Administered Date(s) Administered  . Influenza, High Dose Seasonal PF 02/04/2015  . Pneumococcal Conjugate-13 05/02/2013  . Pneumococcal Polysaccharide-23 06/22/2005  . Td 03/20/2004  . Tdap 10/04/2011  . Zoster 10/04/2011    Health Maintenance  Topic Date Due  . INFLUENZA VACCINE  10/19/2015  . TETANUS/TDAP  10/03/2021  . ZOSTAVAX  Completed  . PNA vac Low Risk Adult  Completed      Discussed health benefits of physical activity, and encouraged him to engage in regular exercise appropriate for his age and condition.    -------------------------------------------------------------------------------  1. Annual physical exam Doing well aside from acute URI.   2. Fever, unspecified fever cause  - POCT Influenza A/B  3. Essential hypertension Well controlled.  Continue current medications.   - Renal function panel - EKG 12-Lead  4. Otitis externa, right Cortisporin refilled.   5. Influenza A Tamiflu 75 BID x 5 days.

## 2015-05-20 DIAGNOSIS — I10 Essential (primary) hypertension: Secondary | ICD-10-CM | POA: Diagnosis not present

## 2015-05-21 LAB — RENAL FUNCTION PANEL
Albumin: 4.3 g/dL (ref 3.5–4.7)
BUN/Creatinine Ratio: 21 (ref 10–22)
BUN: 21 mg/dL (ref 8–27)
CO2: 25 mmol/L (ref 18–29)
Calcium: 9.3 mg/dL (ref 8.6–10.2)
Chloride: 100 mmol/L (ref 96–106)
Creatinine, Ser: 0.99 mg/dL (ref 0.76–1.27)
GFR, EST AFRICAN AMERICAN: 83 mL/min/{1.73_m2} (ref >59)
GFR, EST NON AFRICAN AMERICAN: 72 mL/min/{1.73_m2} (ref >59)
GLUCOSE: 104 mg/dL — AB (ref 65–99)
PHOSPHORUS: 3.5 mg/dL (ref 2.5–4.5)
POTASSIUM: 6 mmol/L — AB (ref 3.5–5.2)
SODIUM: 140 mmol/L (ref 134–144)

## 2015-05-24 ENCOUNTER — Telehealth: Payer: Self-pay | Admitting: *Deleted

## 2015-05-24 DIAGNOSIS — E875 Hyperkalemia: Secondary | ICD-10-CM | POA: Diagnosis not present

## 2015-05-24 NOTE — Telephone Encounter (Signed)
Labs ordered.

## 2015-05-25 LAB — POTASSIUM: Potassium: 4.4 mmol/L (ref 3.5–5.2)

## 2015-05-27 ENCOUNTER — Telehealth: Payer: Self-pay | Admitting: Family Medicine

## 2015-05-27 NOTE — Telephone Encounter (Signed)
Pt wife, Stanton Kidney is requesting pt lab results.  [phone]

## 2015-05-27 NOTE — Telephone Encounter (Signed)
Please see result note. Potassium level is now normal no follow up needed.

## 2015-05-27 NOTE — Telephone Encounter (Signed)
Please review. Thanks!  

## 2015-05-27 NOTE — Telephone Encounter (Signed)
Patient's wife notified of results. Expressed understanding.

## 2015-06-03 DIAGNOSIS — H1132 Conjunctival hemorrhage, left eye: Secondary | ICD-10-CM | POA: Diagnosis not present

## 2015-06-04 DIAGNOSIS — Y92830 Public park as the place of occurrence of the external cause: Secondary | ICD-10-CM | POA: Diagnosis not present

## 2015-06-04 DIAGNOSIS — S50312A Abrasion of left elbow, initial encounter: Secondary | ICD-10-CM | POA: Diagnosis not present

## 2015-06-04 DIAGNOSIS — M25522 Pain in left elbow: Secondary | ICD-10-CM | POA: Diagnosis not present

## 2015-06-04 DIAGNOSIS — Y9301 Activity, walking, marching and hiking: Secondary | ICD-10-CM | POA: Diagnosis not present

## 2015-06-04 DIAGNOSIS — W1830XA Fall on same level, unspecified, initial encounter: Secondary | ICD-10-CM | POA: Diagnosis not present

## 2015-06-04 DIAGNOSIS — Y999 Unspecified external cause status: Secondary | ICD-10-CM | POA: Diagnosis not present

## 2015-06-23 DIAGNOSIS — L03114 Cellulitis of left upper limb: Secondary | ICD-10-CM | POA: Diagnosis not present

## 2015-07-05 ENCOUNTER — Ambulatory Visit (INDEPENDENT_AMBULATORY_CARE_PROVIDER_SITE_OTHER): Payer: Commercial Managed Care - HMO | Admitting: Family Medicine

## 2015-07-05 ENCOUNTER — Encounter: Payer: Self-pay | Admitting: Family Medicine

## 2015-07-05 VITALS — BP 150/70 | HR 55 | Temp 97.4°F | Resp 16 | Ht 69.25 in | Wt 175.0 lb

## 2015-07-05 DIAGNOSIS — R531 Weakness: Secondary | ICD-10-CM | POA: Diagnosis not present

## 2015-07-05 DIAGNOSIS — L03114 Cellulitis of left upper limb: Secondary | ICD-10-CM | POA: Diagnosis not present

## 2015-07-05 MED ORDER — SULFAMETHOXAZOLE-TRIMETHOPRIM 800-160 MG PO TABS: 1.0000 | ORAL_TABLET | Freq: Two times a day (BID) | ORAL | Status: AC

## 2015-07-05 NOTE — Progress Notes (Signed)
Patient: Dennis Cooper Male    DOB: [birth year]   82 y.o.   MRN: [id number] Visit Date: 07/05/2015  Today's Provider: Lelon Huh, MD   Chief Complaint  Patient presents with  . Wound Infection   Subjective:    HPI  Patient fell 1 month ago while hiking and injured his left elbow. Left elbow is still sensitive and has small amount of drainage. Patient saw several different doctors while [place] [address] [place]. He was initially given rocephin injection and prescription for cephalexin. Area started to get warm and warm after finishing cephalexin, and was then prescribed sulfa-antibiotic which he finished yesterday. . Also, patient fell a few days later (about 3 weeks ago) and hurt his right wrist. Right wrist is still swollen but is able to use hand and wrist without restriction. His left leg is swollen from fall and right area on back is hurting him, although getting steadily better.  He states the first fall occurred when he slipped on a rock while walking to urinated at night. The second fall occurred when he stumbled over a tree trunk hidden under the brush. He states he started off strong hiking 12--14 miles a day, but quickly became exhausted and was only able to hike 5 or 6 miles a day before he had to return home from [place], [place]. He plans on resting and getting strength back for a few weeks before deciding whether to return to the trail.    No Known Allergies Previous Medications   AMLODIPINE (NORVASC) 5 MG TABLET    Take 5 mg by mouth daily.   ASPIRIN EC 81 MG TABLET    Take 81 mg by mouth daily.   LOSARTAN-HYDROCHLOROTHIAZIDE (HYZAAR) 100-25 MG PER TABLET    Take 1 tablet by mouth daily.   MULTIPLE VITAMINS-MINERALS (MULTIVITAMIN ADULT PO)    Take 1 tablet by mouth daily.   NEOMYCIN-POLYMYXIN-HYDROCORTISONE (CORTISPORIN) 1 % SOLN OTIC SOLUTION    INSTILL 4 DROPS INTO AFFECTED EAR EVERY 6 HOURS AS NEEDED   TADALAFIL (CIALIS) 5 MG TABLET    Take 10 mg by mouth as  needed for erectile dysfunction (no more than 2 tablets in a day).    Review of Systems  Constitutional: Positive for fatigue. Negative for fever, chills and appetite change.  Respiratory: Negative for chest tightness, shortness of breath and wheezing.   Cardiovascular: Negative for chest pain and palpitations.  Gastrointestinal: Negative for nausea, vomiting and abdominal pain.    Social History  Substance Use Topics  . Smoking status: Former Smoker    Types: Cigarettes    Quit date: 03/20/1978  . Smokeless tobacco: Not on file  . Alcohol Use: 0.0 oz/week    0 Standard drinks or equivalent per week     Comment: Moderate use; drinks beer   Objective:   BP 150/70 mmHg  Pulse 55  Temp(Src) 97.4 F (36.3 C) (Oral)  Resp 16  Ht 5' 9.25" (1.759 m)  Wt 175 lb (79.379 kg)  BMI 25.66 kg/m2  SpO2 96%  Physical Exam  General appearance: alert, well developed, thin cooperative and in no distress Head: Normocephalic, without obvious abnormality, atraumatic Lungs: Respirations even and unlabored Extremities: No gross deformities Skin: Small indented wound, slightly tender in skin over left olecranon, slightly inflamed   Psych: Appropriate mood and affect. Neurologic: Mental status: Alert, oriented to person, place, and time, thought content appropriate.     Assessment & Plan:  1. Weakness  - CBC - Comprehensive metabolic panel  2. Cellulitis of left upper extremity Not quite cleared up, get back on oral antibiotic and recheck one week. - sulfamethoxazole-trimethoprim (BACTRIM DS,SEPTRA DS) 800-160 MG tablet; Take 1 tablet by mouth 2 (two) times daily.  Dispense: 14 tablet; Refill: 0        Lelon Huh, MD  [facility]

## 2015-07-06 LAB — CBC
HEMATOCRIT: 44.4 % (ref 37.5–51.0)
Hemoglobin: 14.9 g/dL (ref 12.6–17.7)
MCH: 30.3 pg (ref 26.6–33.0)
MCHC: 33.6 g/dL (ref 31.5–35.7)
MCV: 90 fL (ref 79–97)
PLATELETS: 198 10*3/uL (ref 150–379)
RBC: 4.91 x10E6/uL (ref 4.14–5.80)
RDW: 13.8 % (ref 12.3–15.4)
WBC: 7.8 10*3/uL (ref 3.4–10.8)

## 2015-07-06 LAB — COMPREHENSIVE METABOLIC PANEL
A/G RATIO: 2.1 (ref 1.2–2.2)
ALT: 28 IU/L (ref 0–44)
AST: 30 IU/L (ref 0–40)
Albumin: 4.2 g/dL (ref 3.5–4.7)
Alkaline Phosphatase: 76 IU/L (ref 39–117)
BILIRUBIN TOTAL: 0.4 mg/dL (ref 0.0–1.2)
BUN/Creatinine Ratio: 20 (ref 10–24)
BUN: 20 mg/dL (ref 8–27)
CALCIUM: 9.3 mg/dL (ref 8.6–10.2)
CHLORIDE: 101 mmol/L (ref 96–106)
CO2: 21 mmol/L (ref 18–29)
Creatinine, Ser: 1.01 mg/dL (ref 0.76–1.27)
GFR, EST AFRICAN AMERICAN: 81 mL/min/{1.73_m2} (ref >59)
GFR, EST NON AFRICAN AMERICAN: 70 mL/min/{1.73_m2} (ref >59)
GLOBULIN, TOTAL: 2 g/dL (ref 1.5–4.5)
Glucose: 98 mg/dL (ref 65–99)
POTASSIUM: 5.6 mmol/L — AB (ref 3.5–5.2)
SODIUM: 142 mmol/L (ref 134–144)
TOTAL PROTEIN: 6.2 g/dL (ref 6.0–8.5)

## 2015-07-12 ENCOUNTER — Ambulatory Visit (INDEPENDENT_AMBULATORY_CARE_PROVIDER_SITE_OTHER): Payer: Commercial Managed Care - HMO | Admitting: Family Medicine

## 2015-07-12 ENCOUNTER — Ambulatory Visit
Admission: RE | Admit: 2015-07-12 | Discharge: 2015-07-12 | Disposition: A | Discharge status: Home / Self Care | Payer: Commercial Managed Care - HMO | Source: Ambulatory Visit | Attending: Family Medicine | Admitting: Family Medicine

## 2015-07-12 ENCOUNTER — Encounter: Payer: Self-pay | Admitting: Family Medicine

## 2015-07-12 VITALS — BP 140/70 | HR 53 | Temp 97.5°F | Resp 16 | Ht 69.25 in | Wt 176.0 lb

## 2015-07-12 DIAGNOSIS — R0602 Shortness of breath: Secondary | ICD-10-CM | POA: Diagnosis not present

## 2015-07-12 DIAGNOSIS — R5383 Other fatigue: Secondary | ICD-10-CM | POA: Diagnosis not present

## 2015-07-12 DIAGNOSIS — M5134 Other intervertebral disc degeneration, thoracic region: Secondary | ICD-10-CM | POA: Diagnosis not present

## 2015-07-12 DIAGNOSIS — S299XXA Unspecified injury of thorax, initial encounter: Secondary | ICD-10-CM | POA: Diagnosis not present

## 2015-07-12 DIAGNOSIS — L03114 Cellulitis of left upper limb: Secondary | ICD-10-CM | POA: Diagnosis not present

## 2015-07-12 DIAGNOSIS — R0789 Other chest pain: Secondary | ICD-10-CM

## 2015-07-12 DIAGNOSIS — R0781 Pleurodynia: Secondary | ICD-10-CM | POA: Diagnosis not present

## 2015-07-12 DIAGNOSIS — M4854XA Collapsed vertebra, not elsewhere classified, thoracic region, initial encounter for fracture: Secondary | ICD-10-CM | POA: Insufficient documentation

## 2015-07-12 DIAGNOSIS — M8588 Other specified disorders of bone density and structure, other site: Secondary | ICD-10-CM | POA: Insufficient documentation

## 2015-07-12 NOTE — Patient Instructions (Signed)
Go to the [facility] on [facility] for chest and rib Xrays

## 2015-07-12 NOTE — Progress Notes (Signed)
Patient: Dennis Cooper Male    DOB: 1932/08/24   82 y.o.   MRN: [id number] Visit Date: 07/12/2015  Today's Provider: Lelon Huh, MD   Chief Complaint  Patient presents with  . Follow-up  . Cellulitis   Subjective:    HPI  Follow-up for cellulitis of left elbow 07/05/2015; restarted sulfa antibiotic and reports that elbow is much better today.  Still very fatigued since returning from AT about 10 days ago. Muscles feel weak. Gets short of breath with minimal exertion. Had two falls with minor injuries while hiking. Fell on right side one time and has had persistent right rib pain ever since.   Pain in chest when he coughs with sob. Having fatigue and swelling in his left ankle which he noticed since visit last   He wears support stocking on right  LE due to prior history of venous stasis, but not on left.   No Known Allergies Previous Medications   AMLODIPINE (NORVASC) 5 MG TABLET    Take 5 mg by mouth daily.   ASPIRIN EC 81 MG TABLET    Take 81 mg by mouth daily.   LOSARTAN-HYDROCHLOROTHIAZIDE (HYZAAR) 100-25 MG PER TABLET    Take 1 tablet by mouth daily.   MULTIPLE VITAMINS-MINERALS (MULTIVITAMIN ADULT PO)    Take 1 tablet by mouth daily.   NEOMYCIN-POLYMYXIN-HYDROCORTISONE (CORTISPORIN) 1 % SOLN OTIC SOLUTION    INSTILL 4 DROPS INTO AFFECTED EAR EVERY 6 HOURS AS NEEDED   SULFAMETHOXAZOLE-TRIMETHOPRIM (BACTRIM DS,SEPTRA DS) 800-160 MG TABLET    Take 1 tablet by mouth 2 (two) times daily.   TADALAFIL (CIALIS) 5 MG TABLET    Take 10 mg by mouth as needed for erectile dysfunction (no more than 2 tablets in a day).    Review of Systems  Constitutional: Positive for fatigue. Negative for fever, chills and appetite change.  Respiratory: Positive for shortness of breath. Negative for chest tightness and wheezing.   Cardiovascular: Positive for chest pain and leg swelling. Negative for palpitations.  Gastrointestinal: Negative for nausea, vomiting and abdominal pain.     Social History  Substance Use Topics  . Smoking status: Former Smoker    Types: Cigarettes    Quit date: 03/20/1978  . Smokeless tobacco: Not on file  . Alcohol Use: 0.0 oz/week    0 Standard drinks or equivalent per week     Comment: Moderate use; drinks beer   Objective:   BP 140/70 mmHg  Pulse 53  Temp(Src) 97.5 F (36.4 C) (Oral)  Resp 16  Ht 5' 9.25" (1.759 m)  Wt 176 lb (79.833 kg)  BMI 25.80 kg/m2  SpO2 99%  Physical Exam   General Appearance:    Alert, cooperative, no distress  Eyes:    PERRL, conjunctiva/corneas clear, EOM's intact       Lungs:     Clear to auscultation bilaterally, respirations unlabored  Heart:    Regular rate and rhythm, 1+ edema left lower leg., no tenderness or erythema.   Neurologic:   Awake, alert, oriented x 3. No apparent focal neurological           defect.   Skin:   No tenderness or erythema of elbow.        Assessment & Plan:     1. Shortness of breath New since return from AT hike.  - DG Chest 2 View; Future  2. Rib pain on right side  - DG Ribs Unilateral Right; Future  3.  Other fatigue CBD and Met c were normal last week. May be recovering from hike and associated injuries.   4. Cellulitis of left upper extremity Resolve. No additional antibiotics needed.        Lelon Huh, MD  [facility].

## 2015-07-15 ENCOUNTER — Telehealth: Payer: Self-pay | Admitting: Family Medicine

## 2015-07-15 DIAGNOSIS — R0609 Other forms of dyspnea: Secondary | ICD-10-CM | POA: Diagnosis not present

## 2015-07-15 NOTE — Telephone Encounter (Signed)
Called pt back and advised that we did want to run more labs if he was still having sob. Lab slip printed and at front desk.

## 2015-07-15 NOTE — Telephone Encounter (Signed)
Pt stated he was advised to call back if his symptoms haven't improved by today b/c he would need more test. It looks like Sharyn Lull advised him of his X ray results on 07/13/15 and there is a message about more labs. Pt stated he didn't think it was supposed to be more labs b/c he just had labs done on 07/05/15. Pt would like a call back from a nurse. Thanks TNP

## 2015-07-16 LAB — BRAIN NATRIURETIC PEPTIDE: BNP: 43.1 pg/mL (ref 0.0–100.0)

## 2015-07-16 LAB — TSH: TSH: 1.9 u[IU]/mL (ref 0.450–4.500)

## 2015-08-17 ENCOUNTER — Other Ambulatory Visit: Payer: Self-pay | Admitting: Family Medicine

## 2015-08-17 MED ORDER — AMLODIPINE BESYLATE 5 MG PO TABS: 5.0000 mg | ORAL_TABLET | Freq: Every day | ORAL | Status: DC

## 2015-08-17 MED ORDER — LOSARTAN POTASSIUM-HCTZ 100-25 MG PO TABS: 1.0000 | ORAL_TABLET | Freq: Every day | ORAL | Status: DC

## 2015-08-17 NOTE — Telephone Encounter (Signed)
Pt contacted office for refill request on the following medications:  [facility] mail order.  90 day supply.  [id number]  amLODipine (NORVASC) 5 MG tablet  losartan-hydrochlorothiazide (HYZAAR) 100-25 MG per tablet

## 2015-10-22 IMAGING — US US EXTREM LOW VENOUS*R*
1 series · 13 of 24 positions shown · non-contrast
Comparison: None.

CLINICAL DATA: Pain swelling.



[Series 1: us extrem low venous*right* · 0.10mm/px · 13 of 35 slices shown]
[im 1/35]
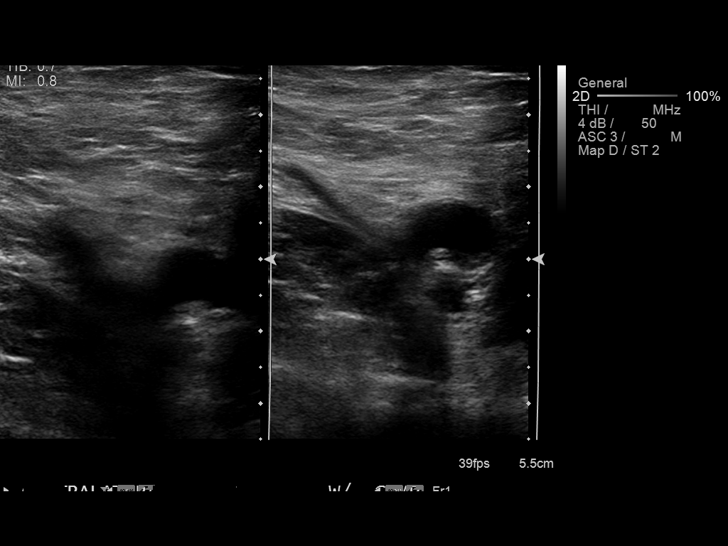
[im 3/35]
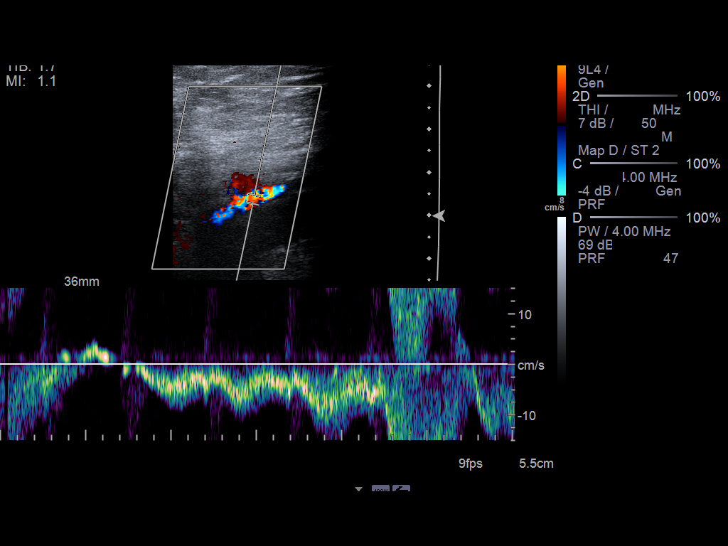
[im 6/35]
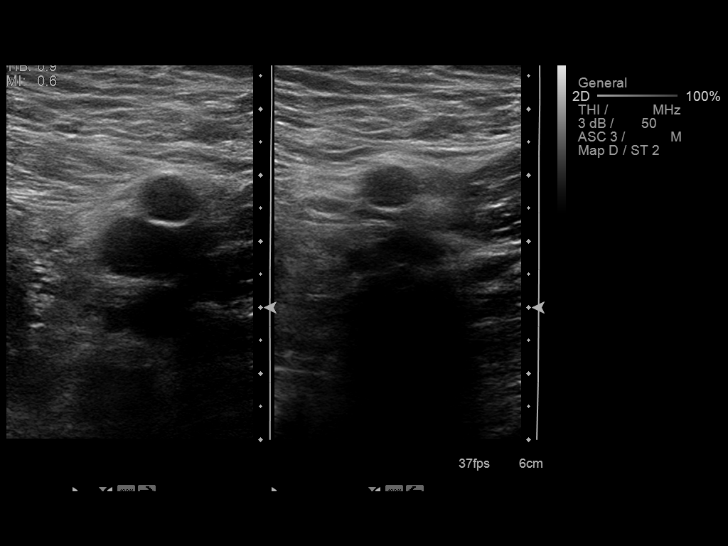
[im 9/35]
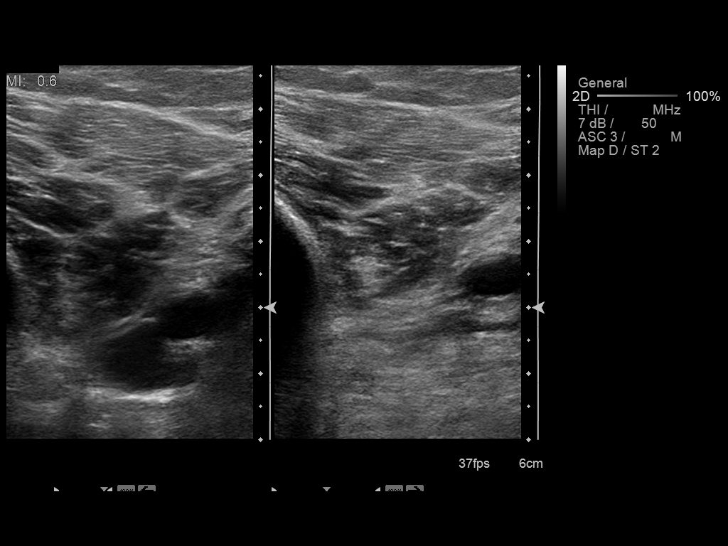
[im 12/35]
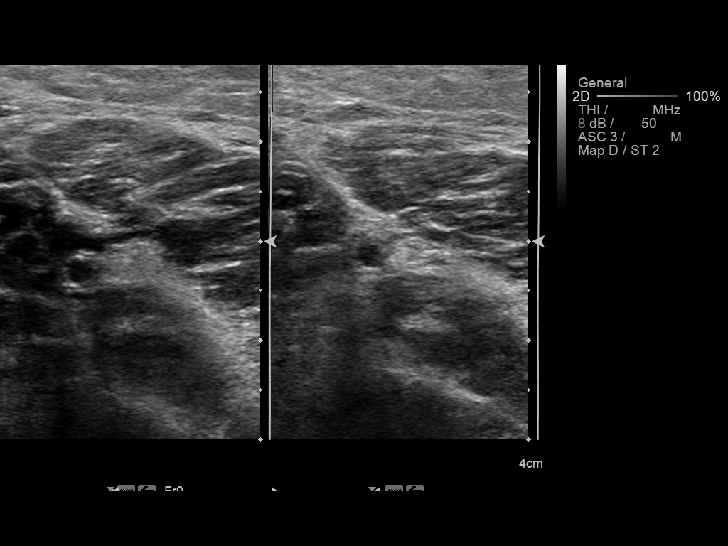
[im 15/35]
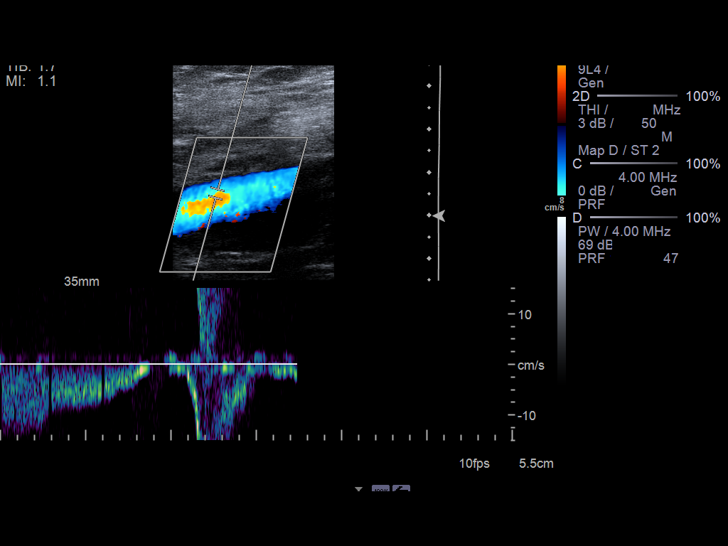
[im 18/35]
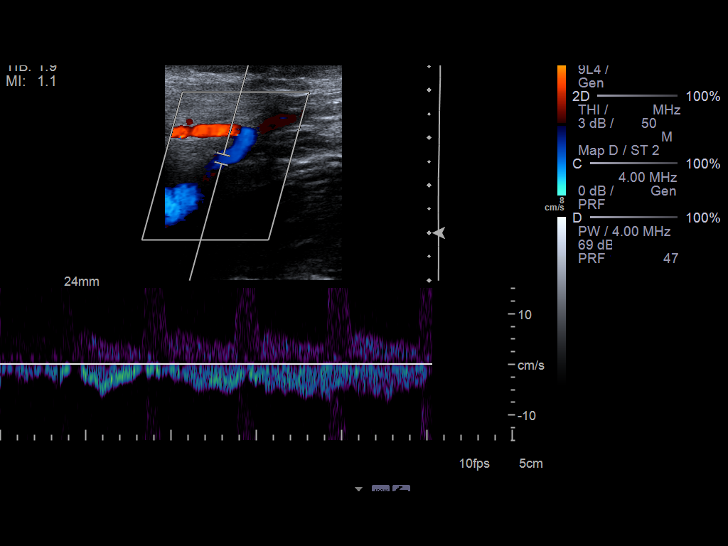
[im 20/35]
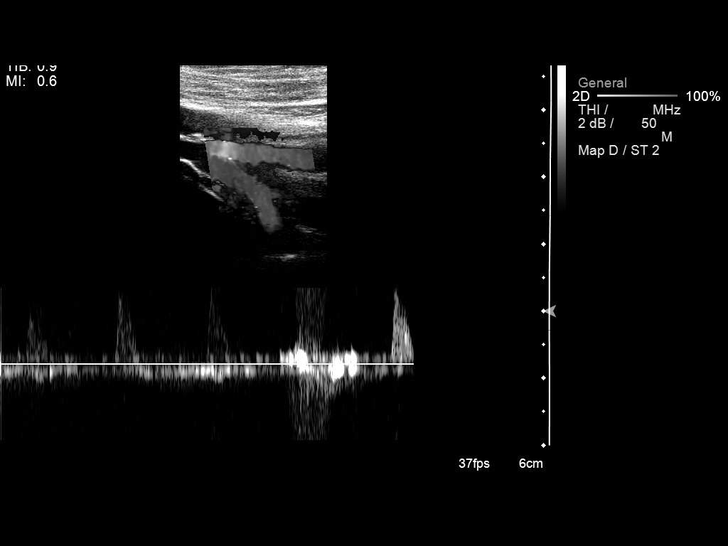
[im 23/35]
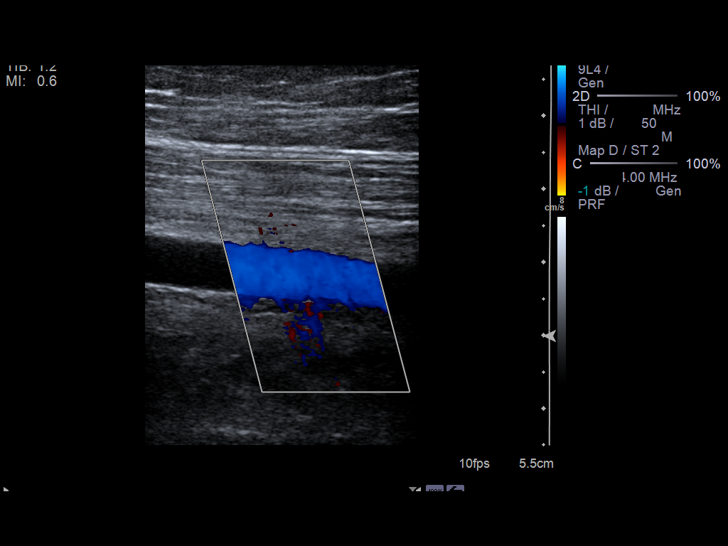
[im 26/35]
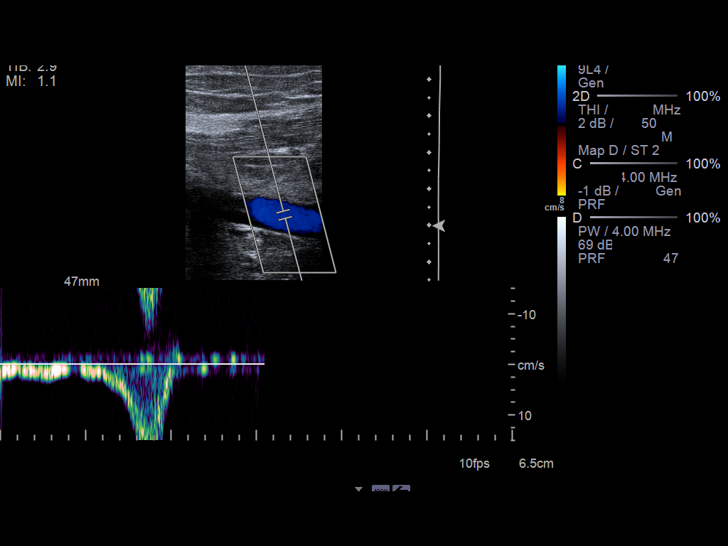
[im 29/35]
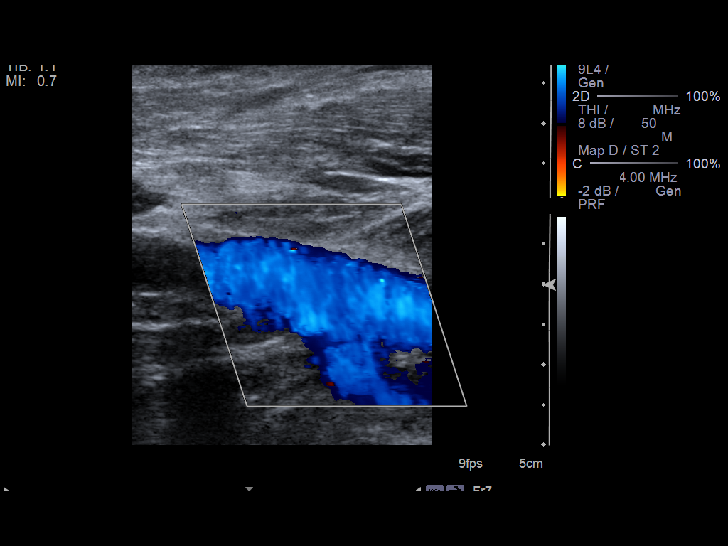
[im 32/35]
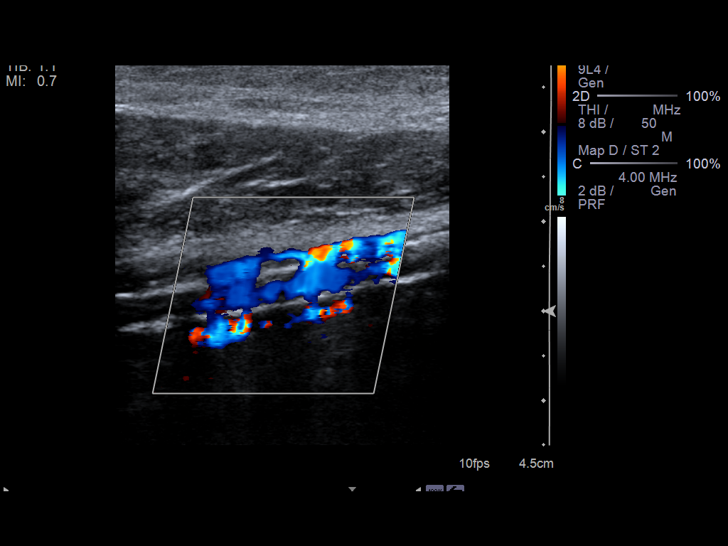
[im 35/35]
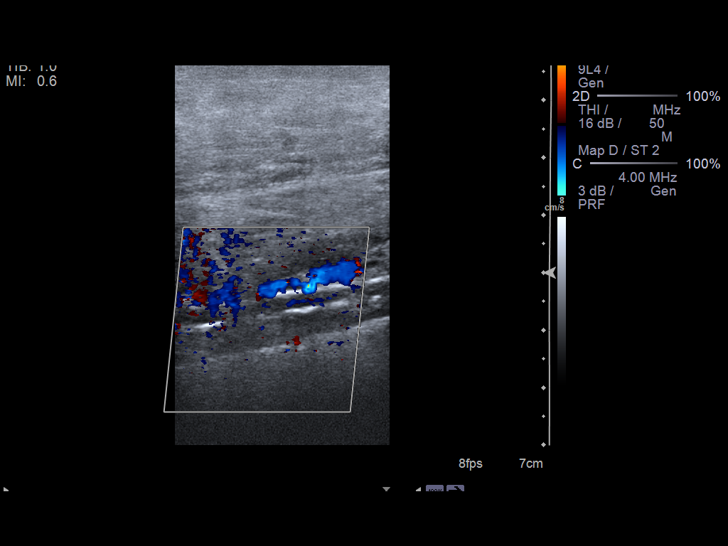

[13 of 24 positions shown; findings below may reference images not displayed]

FINDINGS: Contralateral Common Femoral Vein: Respiratory phasicity is normal
and symmetric with the symptomatic side. No evidence of thrombus.
Normal compressibility.

Common Femoral Vein: No evidence of thrombus. Normal
compressibility, respiratory phasicity and response to augmentation.

Saphenofemoral Junction: No evidence of thrombus. Normal
compressibility and flow on color Doppler imaging.

Profunda Femoral Vein: No evidence of thrombus. Normal
compressibility and flow on color Doppler imaging.

Femoral Vein: No evidence of thrombus. Normal compressibility,
respiratory phasicity and response to augmentation.

Popliteal Vein: No evidence of thrombus. Normal compressibility,
respiratory phasicity and response to augmentation.

Calf Veins: No evidence of thrombus. Normal compressibility and flow
on color Doppler imaging.

Superficial Great Saphenous Vein: No evidence of thrombus. Normal
compressibility and flow on color Doppler imaging.

Venous Reflux:  None.

Other Findings:  None.
IMPRESSION: No evidence of deep venous thrombosis.

## 2015-12-18 ENCOUNTER — Ambulatory Visit (INDEPENDENT_AMBULATORY_CARE_PROVIDER_SITE_OTHER): Payer: Medicare HMO

## 2015-12-18 DIAGNOSIS — Z23 Encounter for immunization: Secondary | ICD-10-CM | POA: Diagnosis not present

## 2016-03-06 ENCOUNTER — Other Ambulatory Visit: Payer: Self-pay | Admitting: Family Medicine

## 2016-03-06 MED ORDER — AMLODIPINE BESYLATE 5 MG PO TABS: 5.0000 mg | ORAL_TABLET | Freq: Every day | ORAL | 2 refills | Status: DC

## 2016-03-06 MED ORDER — LOSARTAN POTASSIUM-HCTZ 100-25 MG PO TABS: 1.0000 | ORAL_TABLET | Freq: Every day | ORAL | 2 refills | Status: DC

## 2016-03-06 NOTE — Telephone Encounter (Signed)
Pt contacted office for refill request on the following medications:  [facility] mail order.  [id number]  losartan-hydrochlorothiazide (HYZAAR) 100-25 MG tablet  amLODipine (NORVASC) 5 MG tablet

## 2016-03-07 DIAGNOSIS — C61 Malignant neoplasm of prostate: Secondary | ICD-10-CM | POA: Diagnosis not present

## 2016-03-15 ENCOUNTER — Ambulatory Visit (INDEPENDENT_AMBULATORY_CARE_PROVIDER_SITE_OTHER): Payer: Commercial Managed Care - HMO | Admitting: Family Medicine

## 2016-03-15 ENCOUNTER — Encounter: Payer: Self-pay | Admitting: Family Medicine

## 2016-03-15 VITALS — BP 142/90 | HR 61 | Temp 97.5°F | Resp 18 | Wt 192.0 lb

## 2016-03-15 DIAGNOSIS — R05 Cough: Secondary | ICD-10-CM | POA: Diagnosis not present

## 2016-03-15 DIAGNOSIS — R059 Cough, unspecified: Secondary | ICD-10-CM

## 2016-03-15 MED ORDER — DOXYCYCLINE MONOHYDRATE 100 MG PO CAPS
100.0000 mg | ORAL_CAPSULE | Freq: Two times a day (BID) | ORAL | 0 refills | Status: AC
Start: 2016-03-15 — End: 2016-03-29

## 2016-03-15 MED ORDER — MONTELUKAST SODIUM 10 MG PO TABS: 10.0000 mg | ORAL_TABLET | Freq: Every day | ORAL | 0 refills | Status: DC

## 2016-03-15 NOTE — Patient Instructions (Signed)
Call for further testing if your cough is not much better within 2 weeks.

## 2016-03-15 NOTE — Progress Notes (Signed)
Patient: Dennis Cooper Male    DOB: June 24, 1932   82 y.o.   MRN: [id number] Visit Date: 03/15/2016  Today's Provider: Lelon Huh, MD   Chief Complaint  Patient presents with  . Cough   Subjective:    Cough  This is a new problem. Episode onset: 5 weeks ago. The problem has been unchanged. The cough is productive of sputum (white/ clear colored). Pertinent negatives include no chest pain, chills, ear congestion, ear pain, fever, headaches, heartburn, hemoptysis, myalgias, nasal congestion, postnasal drip, rhinorrhea, sore throat, shortness of breath, sweats, weight loss or wheezing. The symptoms are aggravated by exercise (also warm air). Treatments tried: cough drops and Mucinex. The treatment provided mild relief.  He states that he had bad cold about 5 weeks ago when cough started, and all other symptoms resolved after a few days, but cough never did. He states it gets worse when he lays down, or when he is breathing warm air, or when fireplace is on. He denies any heart burn, sore throat, dysphagia.      No Known Allergies   Current Outpatient Prescriptions:  .  amLODipine (NORVASC) 5 MG tablet, Take 1 tablet (5 mg total) by mouth daily., Disp: 90 tablet, Rfl: 2 .  aspirin EC 81 MG tablet, Take 81 mg by mouth daily., Disp: , Rfl:  .  losartan-hydrochlorothiazide (HYZAAR) 100-25 MG tablet, Take 1 tablet by mouth daily., Disp: 90 tablet, Rfl: 2 .  Multiple Vitamins-Minerals (MULTIVITAMIN ADULT PO), Take 1 tablet by mouth daily., Disp: , Rfl:  .  NEOMYCIN-POLYMYXIN-HYDROCORTISONE (CORTISPORIN) 1 % SOLN otic solution, INSTILL 4 DROPS INTO AFFECTED EAR EVERY 6 HOURS AS NEEDED, Disp: 10 mL, Rfl: 2 .  tadalafil (CIALIS) 5 MG tablet, Take 10 mg by mouth as needed for erectile dysfunction (no more than 2 tablets in a day)., Disp: , Rfl:   Review of Systems  Constitutional: Negative for appetite change, chills, diaphoresis, fatigue, fever and weight loss.  HENT: Negative for ear  pain, postnasal drip, rhinorrhea and sore throat.   Respiratory: Positive for cough. Negative for hemoptysis, chest tightness, shortness of breath and wheezing.   Cardiovascular: Negative for chest pain, palpitations and leg swelling.  Gastrointestinal: Negative for abdominal pain, heartburn, nausea and vomiting.  Musculoskeletal: Negative for myalgias.  Neurological: Negative for dizziness and headaches.    Social History  Substance Use Topics  . Smoking status: Former Smoker    Types: Cigarettes    Quit date: 03/20/1978  . Smokeless tobacco: Never Used  . Alcohol use 0.0 oz/week     Comment: Moderate use; drinks beer   Objective:   BP (!) 142/90 (BP Location: Left Arm, Patient Position: Sitting, Cuff Size: Large)   Pulse 61   Temp 97.5 F (36.4 C) (Oral)   Resp 18   Wt 192 lb (87.1 kg)   SpO2 97% Comment: room air  BMI 28.15 kg/m   Physical Exam  General Appearance:    Alert, cooperative, no distress  HENT:   bilateral TM normal without fluid or infection, neck has bilateral anterior cervical nodes enlarged, sinuses nontender, post nasal drip noted and nasal mucosa congested  Eyes:    PERRL, conjunctiva/corneas clear, EOM's intact       Lungs:     Clear to auscultation bilaterally, respirations unlabored  Heart:    Regular rate and rhythm  Neurologic:   Awake, alert, oriented x 3. No apparent focal neurological  defect.           Assessment & Plan:     1. Cough Persistent for over a month, initially associated with URI symptoms.  - doxycycline (MONODOX) 100 MG capsule; Take 1 capsule (100 mg total) by mouth 2 (two) times daily.  Dispense: 28 capsule; Refill: 0 - montelukast (SINGULAIR) 10 MG tablet; Take 1 tablet (10 mg total) by mouth at bedtime.  Dispense: 30 tablet; Refill: 0  Call if not much better after finishing doxycycline.     The entirety of the information documented in the History of Present Illness, Review of Systems and Physical Exam were  personally obtained by me. Portions of this information were initially documented by Meyer Cory, CMA and reviewed by me for thoroughness and accuracy.    Lelon Huh, MD  [facility]

## 2016-04-11 ENCOUNTER — Other Ambulatory Visit: Payer: Self-pay | Admitting: Family Medicine

## 2016-04-11 DIAGNOSIS — R05 Cough: Secondary | ICD-10-CM

## 2016-04-11 DIAGNOSIS — R059 Cough, unspecified: Secondary | ICD-10-CM

## 2016-04-20 ENCOUNTER — Telehealth: Payer: Self-pay | Admitting: Family Medicine

## 2016-04-20 NOTE — Telephone Encounter (Signed)
Called Pt to schedule AWV with [facility] - knb °

## 2016-05-15 ENCOUNTER — Ambulatory Visit (INDEPENDENT_AMBULATORY_CARE_PROVIDER_SITE_OTHER): Payer: Medicare HMO | Admitting: Family Medicine

## 2016-05-15 ENCOUNTER — Encounter: Payer: Self-pay | Admitting: Family Medicine

## 2016-05-15 VITALS — BP 132/78 | HR 56 | Temp 98.1°F | Resp 16 | Ht 69.0 in | Wt 193.0 lb

## 2016-05-15 DIAGNOSIS — Z0000 Encounter for general adult medical examination without abnormal findings: Secondary | ICD-10-CM | POA: Diagnosis not present

## 2016-05-15 DIAGNOSIS — I10 Essential (primary) hypertension: Secondary | ICD-10-CM | POA: Diagnosis not present

## 2016-05-15 DIAGNOSIS — Z8546 Personal history of malignant neoplasm of prostate: Secondary | ICD-10-CM | POA: Diagnosis not present

## 2016-05-15 NOTE — Progress Notes (Signed)
Patient: Dennis Cooper, Male    DOB: [birth year], 83 y.o.   MRN: [id number] Visit Date: 05/15/2016  Today's Provider: Lelon Huh, MD   Chief Complaint  Patient presents with  . Annual Exam  . Hypertension   Subjective:    Annual wellness visit Dennis Cooper is a 83 y.o. male. He feels well. He reports exercising daily. He reports he is sleeping fairly well.    Hypertension, follow-up:  BP Readings from Last 3 Encounters:  05/15/16 132/78  03/15/16 (!) 142/90  07/12/15 140/70    He was last seen for hypertension 1 years ago.  BP at that visit was 142/90. Management since that visit includes no changes. He reports good compliance with treatment. He is not having side effects.  He is exercising. He is not adherent to low salt diet.   Outside blood pressures are checked occasionally. He is experiencing none.  Patient denies exertional chest pressure/discomfort, lower extremity edema and palpitations.    Weight trend: stable Wt Readings from Last 3 Encounters:  05/15/16 193 lb (87.5 kg)  03/15/16 192 lb (87.1 kg)  07/12/15 176 lb (79.8 kg)    Current diet: well balanced     Review of Systems  Constitutional: Negative.   HENT: Negative.   Eyes: Negative.   Respiratory: Negative.   Cardiovascular: Negative.   Gastrointestinal: Negative.   Endocrine: Negative.   Genitourinary: Negative.   Musculoskeletal: Negative.   Skin: Negative.   Allergic/Immunologic: Negative.   Neurological: Negative.   Hematological: Negative.   Psychiatric/Behavioral: Negative.     Social History   Social History  . Marital status: Married    Spouse name: N/A  . Number of children: N/A  . Years of education: N/A   Occupational History  . Not on file.   Social History Main Topics  . Smoking status: Former Smoker    Types: Cigarettes    Quit date: 03/20/1978  . Smokeless tobacco: Never Used  . Alcohol use 0.0 oz/week     Comment: Moderate use; drinks beer    . Drug use: No  . Sexual activity: Not on file   Other Topics Concern  . Not on file   Social History Narrative  . No narrative on file    Past Medical History:  Diagnosis Date  . Erectile dysfunction   . History of chicken pox   . History of measles as a child   . Hypertension      Patient Active Problem List   Diagnosis Date Noted  . History of prostate cancer 10/19/2014  . Hypertension 10/19/2014  . Erectile dysfunction 10/19/2014  . History of colon polyps 10/19/2014  . Plantar fasciitis 10/19/2014  . Seborrhea 10/19/2014    Past Surgical History:  Procedure Laterality Date  . CHOLECYSTECTOMY  2010   Dr. Jamal Collin  . HERNIA REPAIR  2010   inguinal; Dr. Jamal Collin  . stomach ulcer  1971    His family history includes Arthritis in his brother and sister; Congestive Heart Failure in his brother; Diabetes in his brother, sister, and sister; Heart attack in his sister; Hypertension in his brother and sister.      Current Outpatient Prescriptions:  .  amLODipine (NORVASC) 5 MG tablet, Take 1 tablet (5 mg total) by mouth daily., Disp: 90 tablet, Rfl: 2 .  aspirin EC 81 MG tablet, Take 81 mg by mouth daily., Disp: , Rfl:  .  losartan-hydrochlorothiazide (HYZAAR) 100-25 MG tablet, Take 1 tablet  by mouth daily., Disp: 90 tablet, Rfl: 2 .  Multiple Vitamins-Minerals (MULTIVITAMIN ADULT PO), Take 1 tablet by mouth daily., Disp: , Rfl:  .  NEOMYCIN-POLYMYXIN-HYDROCORTISONE (CORTISPORIN) 1 % SOLN otic solution, INSTILL 4 DROPS INTO AFFECTED EAR EVERY 6 HOURS AS NEEDED, Disp: 10 mL, Rfl: 2 .  tadalafil (CIALIS) 5 MG tablet, Take 10 mg by mouth as needed for erectile dysfunction (no more than 2 tablets in a day)., Disp: , Rfl:  .  montelukast (SINGULAIR) 10 MG tablet, TAKE 1 TABLET (10 MG TOTAL) BY MOUTH AT BEDTIME., Disp: 30 tablet, Rfl: 5  Patient Care Team: Birdie Sons, MD as PCP - General (Family Medicine)     Objective:   Vitals: BP 132/78 (BP Location: Right Arm,  Patient Position: Sitting, Cuff Size: Normal)   Pulse (!) 56   Temp 98.1 F (36.7 C)   Resp 16   Ht 5\' 9"  (1.753 m)   Wt 193 lb (87.5 kg)   BMI 28.50 kg/m   Physical Exam   General Appearance:    Alert, cooperative, no distress, appears stated age  Head:    Normocephalic, without obvious abnormality, atraumatic  Eyes:    PERRL, conjunctiva/corneas clear, EOM's intact, fundi    benign, both eyes       Ears:    Normal TM's and external ear canals, both ears  Nose:   Nares normal, septum midline, mucosa normal, no drainage   or sinus tenderness  Throat:   Lips, mucosa, and tongue normal; teeth and gums normal  Neck:   Supple, symmetrical, trachea midline, no adenopathy;       thyroid:  No enlargement/tenderness/nodules; no carotid   bruit or JVD  Back:     Symmetric, no curvature, ROM normal, no CVA tenderness  Lungs:     Clear to auscultation bilaterally, respirations unlabored  Chest wall:    No tenderness or deformity  Heart:    Regular rate and rhythm, S1 and S2 normal, no murmur, rub   or gallop  Abdomen:     Soft, non-tender, bowel sounds active all four quadrants,    no masses, no organomegaly  Genitalia:    deferred  Rectal:    deferred  Extremities:   Extremities normal, atraumatic, no cyanosis or edema  Pulses:   2+ and symmetric all extremities  Skin:   Skin color, texture, turgor normal, no rashes or lesions  Lymph nodes:   Cervical, supraclavicular, and axillary nodes normal  Neurologic:   CNII-XII intact. Normal strength, sensation and reflexes      throughout    Activities of Daily Living In your present state of health, do you have any difficulty performing the following activities: 05/15/2016  Hearing? Y  Vision? N  Difficulty concentrating or making decisions? N  Walking or climbing stairs? N  Dressing or bathing? N  Doing errands, shopping? N  Some recent data might be hidden    Fall Risk Assessment Fall Risk  05/15/2016 05/13/2015  Falls in the past  year? No No     Depression Screen PHQ 2/9 Scores 05/15/2016 05/13/2015  PHQ - 2 Score 0 0    Cognitive Testing - 6-CIT  Correct? Score   What year is it? yes 0 0 or 4  What month is it? yes 0 0 or 3  Memorize:    Pia Mau,  44,  [place],      What time is it? (within 1 hour) yes 0 0 or  3  Count backwards from 20 yes 0 0, 2, or 4  Name the months of the year yes 0 0, 2, or 4  Repeat name & address above yes 0 0, 2, 4, 6, 8, or 10       TOTAL SCORE  0/28   Interpretation:  Normal  Normal (0-7) Abnormal (8-28)       Assessment & Plan:    Annual Physical   Reviewed patient's Family Medical History Reviewed and updated list of patient's medical providers Assessment of cognitive impairment was done Assessed patient's functional ability Established a written schedule for health screening [place] Completed and Reviewed  Exercise Activities and Dietary recommendations Goals    None      Immunization History  Administered Date(s) Administered  . Influenza, High Dose Seasonal PF 02/04/2015, 12/18/2015  . Pneumococcal Conjugate-13 05/02/2013  . Pneumococcal Polysaccharide-23 06/22/2005  . Td 03/20/2004  . Tdap 10/04/2011  . Zoster 10/04/2011    Health Maintenance  Topic Date Due  . Samul Dada  10/03/2021  . INFLUENZA VACCINE  Completed  . PNA vac Low Risk Adult  Completed     Discussed health benefits of physical activity, and encouraged him to engage in regular exercise appropriate for his age and condition.   The entirety of the information documented in the History of Present Illness, Review of Systems and Physical Exam were personally obtained by me. Portions of this information were initially documented by Wilburt Finlay, CMA and reviewed by me for thoroughness and accuracy.     Lelon Huh, MD  [facility]

## 2016-12-18 ENCOUNTER — Other Ambulatory Visit: Payer: Self-pay | Admitting: Family Medicine

## 2016-12-18 MED ORDER — AMLODIPINE BESYLATE 5 MG PO TABS: 5.0000 mg | ORAL_TABLET | Freq: Every day | ORAL | 2 refills | Status: DC

## 2016-12-18 MED ORDER — LOSARTAN POTASSIUM-HCTZ 100-25 MG PO TABS: 1.0000 | ORAL_TABLET | Freq: Every day | ORAL | 2 refills | Status: DC

## 2016-12-18 NOTE — Telephone Encounter (Signed)
Pt needs new refills on her   Amlodipine 5 mg Losartan 100-25 mg  90 days supply  He uses [facility]  Pt's call [phone]  Thanks Con Memos

## 2016-12-21 ENCOUNTER — Ambulatory Visit (INDEPENDENT_AMBULATORY_CARE_PROVIDER_SITE_OTHER): Payer: Medicare HMO

## 2016-12-21 DIAGNOSIS — Z23 Encounter for immunization: Secondary | ICD-10-CM

## 2017-03-20 DIAGNOSIS — Z85828 Personal history of other malignant neoplasm of skin: Secondary | ICD-10-CM

## 2017-03-20 HISTORY — DX: Personal history of other malignant neoplasm of skin: Z85.828

## 2017-04-19 IMAGING — CR DG RIBS 2V*R*
1 series · 3 of 3 positions shown · non-contrast
Comparison: No prior.

CLINICAL DATA: Fall.

EXAM:
RIGHT RIBS - 2 VIEW

[Series 1: dg ribs unilateral right · 0.14mm/px · 3 of 3 slices shown]
[im 1/3]
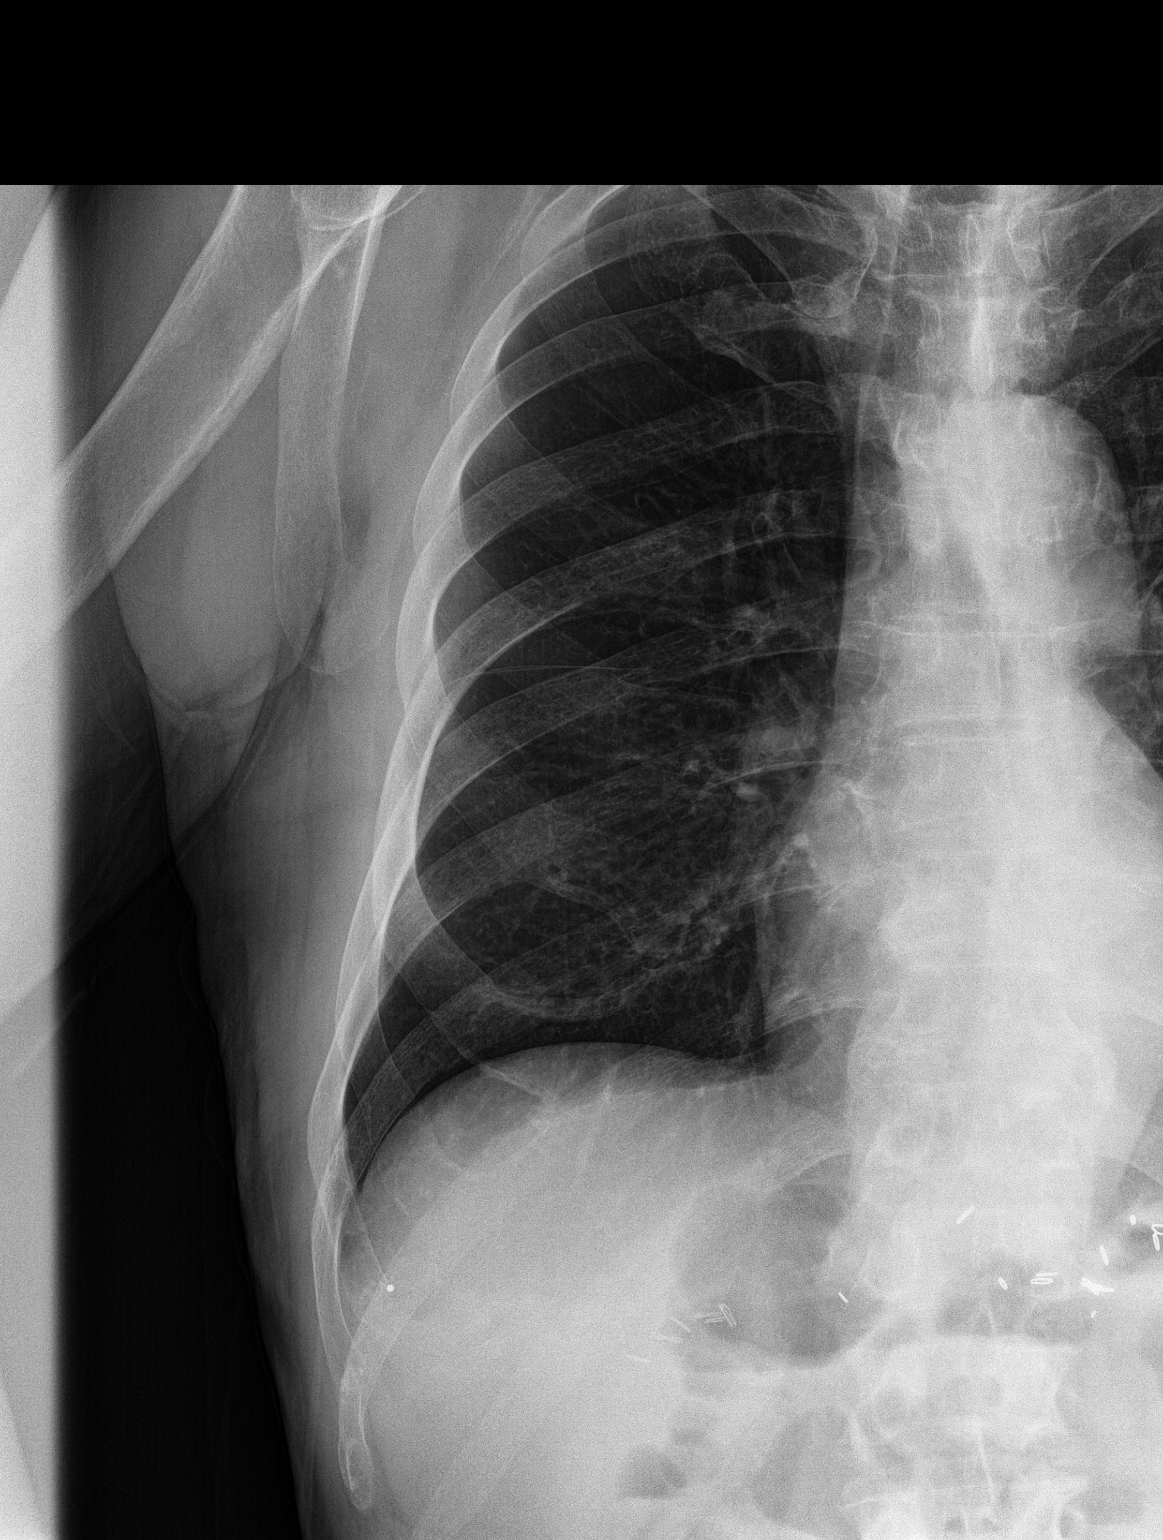
[im 2/3]
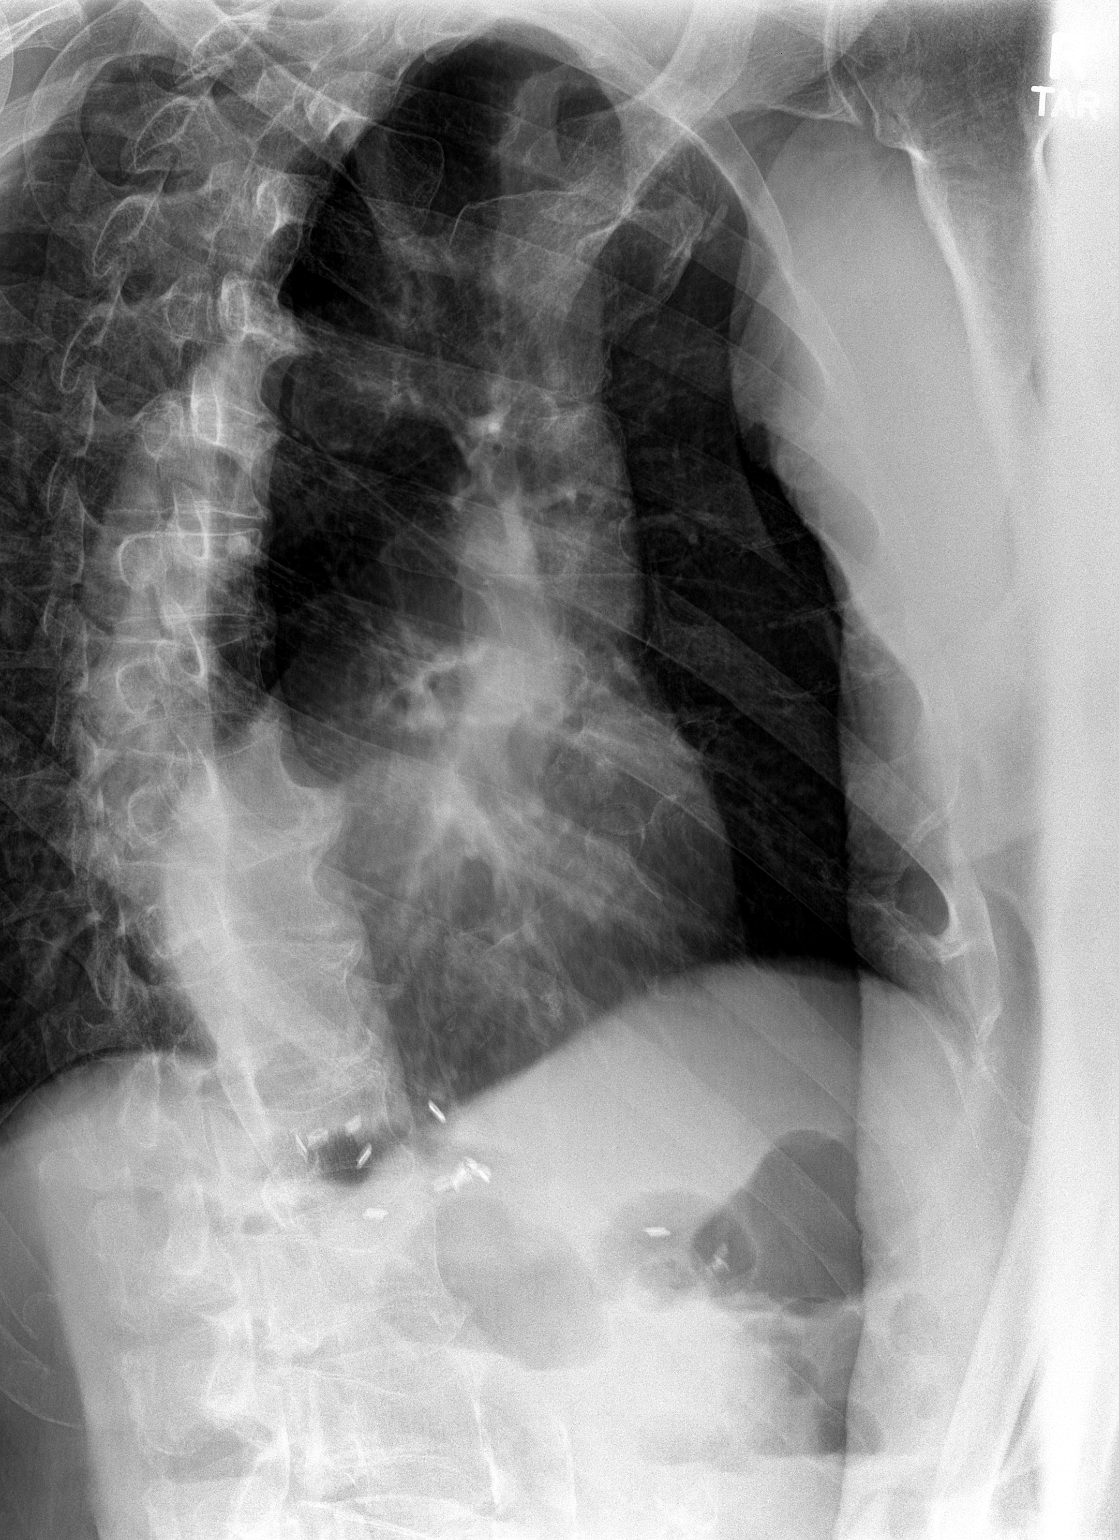
[im 3/3]
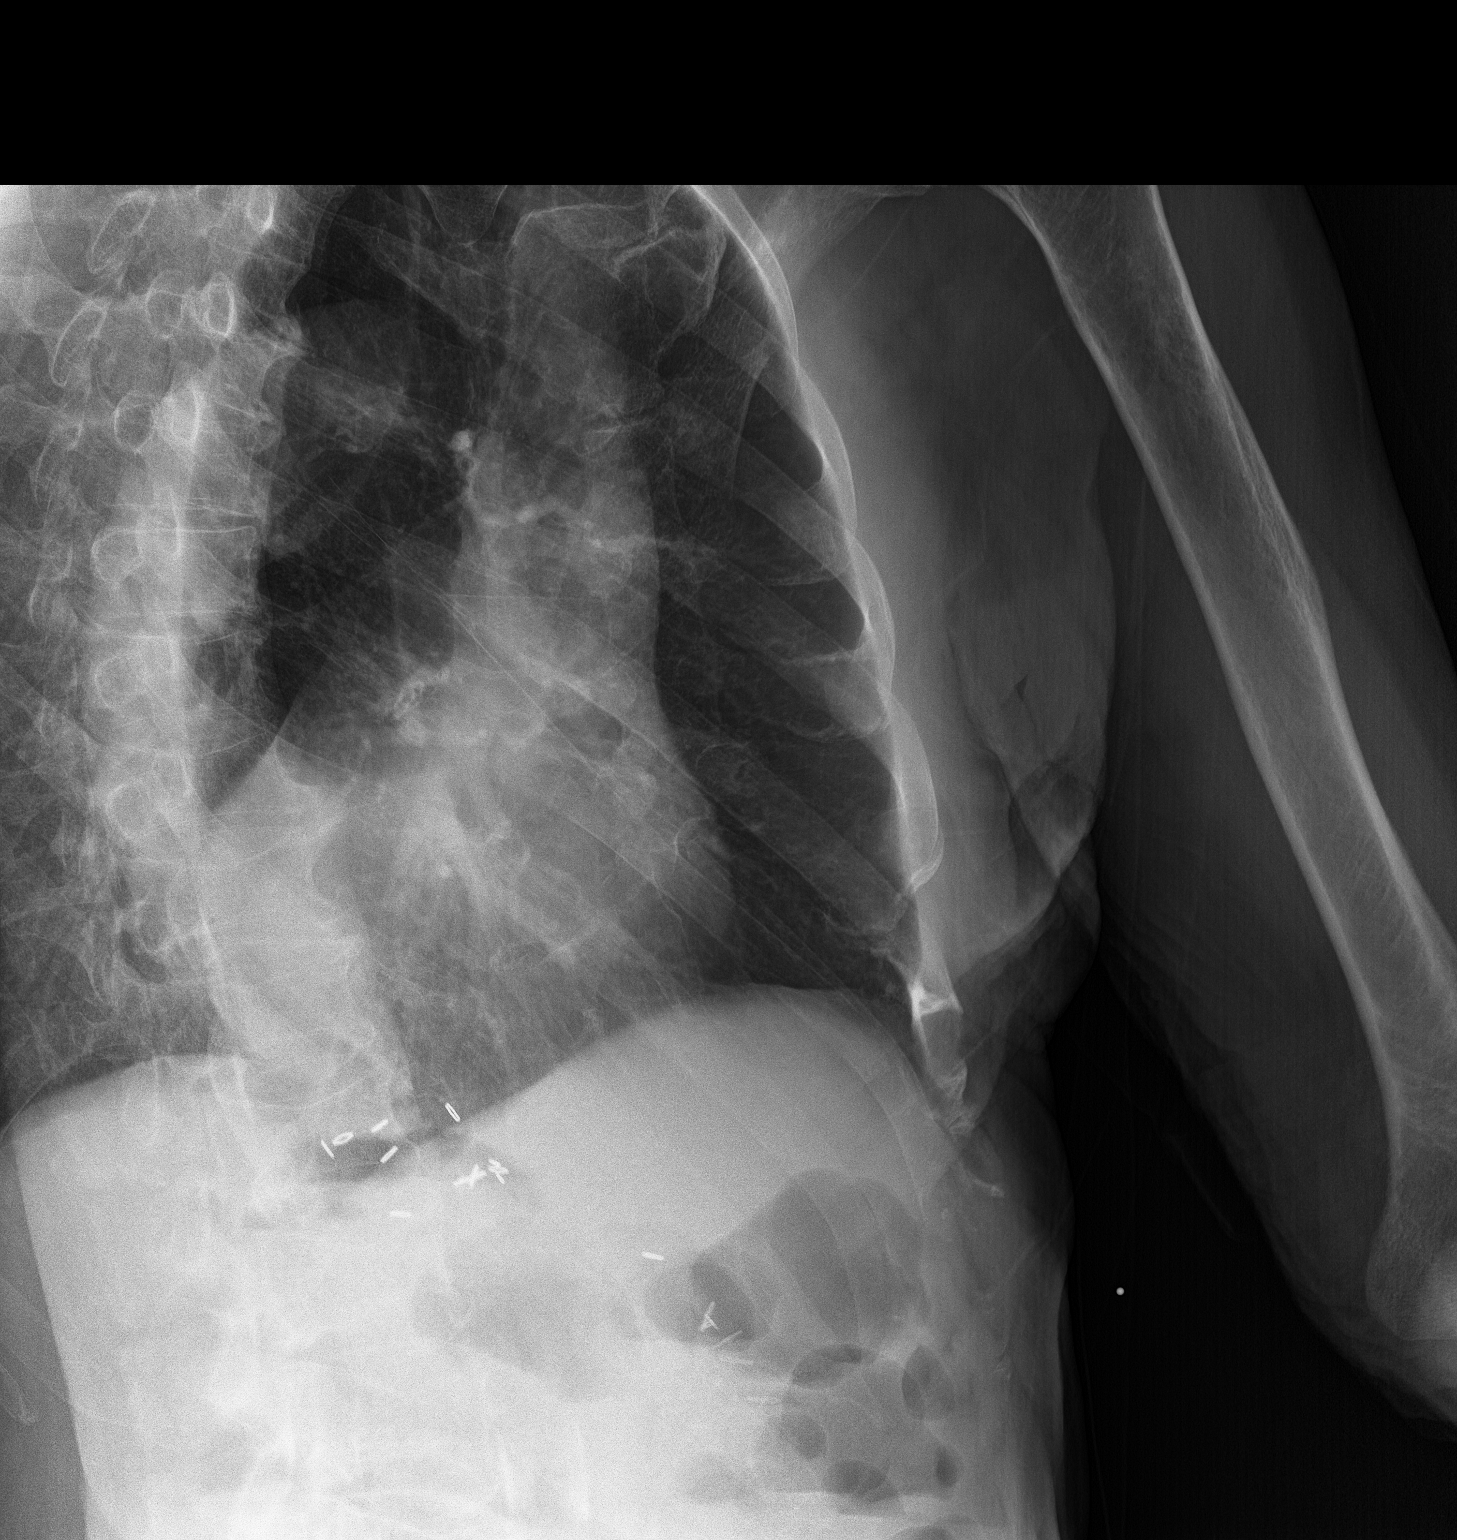

[3 of 3 positions shown; findings below may reference images not displayed]

FINDINGS: No evidence of displaced rib fracture or pneumothorax. Multiple
thoracic spine compression fractures are noted age undetermined.
Surgical clips upper abdomen.
IMPRESSION: 1. Multiple thoracic spine compression fractures, age undetermined.
Diffuse degenerative changes thoracic spine.

2. No evidence of displaced rib fracture or pneumothorax.

## 2017-05-02 ENCOUNTER — Encounter: Payer: Self-pay | Admitting: Family Medicine

## 2017-05-02 ENCOUNTER — Ambulatory Visit (INDEPENDENT_AMBULATORY_CARE_PROVIDER_SITE_OTHER): Payer: Medicare HMO | Admitting: Family Medicine

## 2017-05-02 VITALS — BP 120/70 | HR 67 | Temp 98.0°F | Resp 22 | Wt 202.0 lb

## 2017-05-02 DIAGNOSIS — J40 Bronchitis, not specified as acute or chronic: Secondary | ICD-10-CM | POA: Diagnosis not present

## 2017-05-02 MED ORDER — CEFDINIR 300 MG PO CAPS: 600.0000 mg | ORAL_CAPSULE | Freq: Every day | ORAL | 0 refills | Status: AC

## 2017-05-02 NOTE — Progress Notes (Signed)
Patient: Dennis Cooper Male    DOB: Nov 21, 1932   84 y.o.   MRN: [id number] Visit Date: 05/02/2017  Today's Provider: Lelon Huh, MD   Chief Complaint  Patient presents with  . Cough    x 2 days   Subjective:    Cough  This is a new problem. Episode onset: 2 days ago. The problem has been gradually worsening. The cough is productive of sputum (scanty production of light yellow sputum). Associated symptoms include chest pain (soreness in chest during coughing spells) and rhinorrhea. Pertinent negatives include no chills, ear congestion, ear pain, fever, headaches, nasal congestion, postnasal drip, sore throat, shortness of breath, sweats or wheezing. Treatments tried: OTC Cold medication. The treatment provided no relief.       No Known Allergies   Current Outpatient Medications:  .  amLODipine (NORVASC) 5 MG tablet, Take 1 tablet (5 mg total) by mouth daily., Disp: 90 tablet, Rfl: 2 .  aspirin EC 81 MG tablet, Take 81 mg by mouth daily., Disp: , Rfl:  .  losartan-hydrochlorothiazide (HYZAAR) 100-25 MG tablet, Take 1 tablet by mouth daily., Disp: 90 tablet, Rfl: 2 .  Multiple Vitamins-Minerals (MULTIVITAMIN ADULT PO), Take 1 tablet by mouth daily., Disp: , Rfl:  .  NEOMYCIN-POLYMYXIN-HYDROCORTISONE (CORTISPORIN) 1 % SOLN otic solution, INSTILL 4 DROPS INTO AFFECTED EAR EVERY 6 HOURS AS NEEDED, Disp: 10 mL, Rfl: 2 .  tadalafil (CIALIS) 5 MG tablet, Take 10 mg by mouth as needed for erectile dysfunction (no more than 2 tablets in a day)., Disp: , Rfl:  .  montelukast (SINGULAIR) 10 MG tablet, TAKE 1 TABLET (10 MG TOTAL) BY MOUTH AT BEDTIME., Disp: 30 tablet, Rfl: 5  Review of Systems  Constitutional: Positive for fatigue. Negative for appetite change, chills and fever.  HENT: Positive for congestion, rhinorrhea and sneezing. Negative for ear pain, postnasal drip, sinus pressure, sinus pain and sore throat.   Eyes: Positive for discharge (watery).  Respiratory: Positive for  cough. Negative for chest tightness, shortness of breath and wheezing.   Cardiovascular: Positive for chest pain (soreness in chest during coughing spells). Negative for palpitations.  Gastrointestinal: Negative for abdominal pain, nausea and vomiting.  Neurological: Negative for headaches.    Social History   Tobacco Use  . Smoking status: Former Smoker    Types: Cigarettes    Last attempt to quit: 03/20/1978    Years since quitting: 39.1  . Smokeless tobacco: Never Used  Substance Use Topics  . Alcohol use: Yes    Alcohol/week: 0.0 oz    Comment: Moderate use; drinks beer   Objective:   BP 120/70 (BP Location: Left Arm, Patient Position: Sitting, Cuff Size: Large)   Pulse 67   Temp 98 F (36.7 C) (Oral)   Resp (!) 22   Wt 202 lb (91.6 kg)   SpO2 98% Comment: room air  BMI 29.83 kg/m     Physical Exam   General Appearance:    Alert, cooperative, no distress  Eyes:    PERRL, conjunctiva/corneas clear, EOM's intact       Lungs:     Occasional faint expiratory wheeze, no rales,  respirations unlabored  Heart:    Regular rate and rhythm  Neurologic:   Awake, alert, oriented x 3. No apparent focal neurological           defect.           Assessment & Plan:     1. Bronchitis  -  cefdinir (OMNICEF) 300 MG capsule; Take 2 capsules (600 mg total) by mouth daily for 7 days.  Dispense: 14 capsule; Refill: 0 Call if symptoms change or if not rapidly improving.          Lelon Huh, MD  [facility]

## 2017-05-02 NOTE — Patient Instructions (Signed)

## 2017-05-09 ENCOUNTER — Ambulatory Visit: Payer: Self-pay

## 2017-05-16 ENCOUNTER — Encounter: Payer: Self-pay | Admitting: Family Medicine

## 2017-05-23 ENCOUNTER — Ambulatory Visit (INDEPENDENT_AMBULATORY_CARE_PROVIDER_SITE_OTHER): Payer: Medicare HMO | Admitting: Family Medicine

## 2017-05-23 ENCOUNTER — Ambulatory Visit (INDEPENDENT_AMBULATORY_CARE_PROVIDER_SITE_OTHER): Payer: Medicare HMO

## 2017-05-23 VITALS — BP 170/84 | HR 68 | Temp 97.8°F | Ht 69.0 in | Wt 205.0 lb

## 2017-05-23 DIAGNOSIS — D229 Melanocytic nevi, unspecified: Secondary | ICD-10-CM

## 2017-05-23 DIAGNOSIS — I10 Essential (primary) hypertension: Secondary | ICD-10-CM | POA: Diagnosis not present

## 2017-05-23 DIAGNOSIS — Z0000 Encounter for general adult medical examination without abnormal findings: Secondary | ICD-10-CM

## 2017-05-23 DIAGNOSIS — Z86010 Personal history of colonic polyps: Secondary | ICD-10-CM | POA: Diagnosis not present

## 2017-05-23 DIAGNOSIS — Z8546 Personal history of malignant neoplasm of prostate: Secondary | ICD-10-CM

## 2017-05-23 NOTE — Patient Instructions (Signed)
[name] , Thank you for taking time to come for your Medicare Wellness Visit. I appreciate your ongoing commitment to your health goals. Please review the following plan we discussed and let me know if I can assist you in the future.   Screening recommendations/referrals: Colonoscopy: Up to date Recommended yearly ophthalmology/optometry visit for glaucoma screening and checkup Recommended yearly dental visit for hygiene and checkup  Vaccinations: Influenza vaccine: Up to date Pneumococcal vaccine: Up to date Tdap vaccine: Up to date Shingles vaccine: Pt declines today.     Advanced directives: Please bring a copy of your POA (Power of Attorney) and/or Living Will to your next appointment.   Conditions/risks identified: Recommend decreasing amount of beers consumed a day, by half.  Next appointment: 9:00 AM today with Dr Caryn Section.   Preventive Care 82 Years and Older, Male Preventive care refers to lifestyle choices and visits with your health care provider that can promote health and wellness. What does preventive care include?  A yearly physical exam. This is also called an annual well check.  Dental exams once or twice a year.  Routine eye exams. Ask your health care provider how often you should have your eyes checked.  Personal lifestyle choices, including:  Daily care of your teeth and gums.  Regular physical activity.  Eating a healthy diet.  Avoiding tobacco and drug use.  Limiting alcohol use.  Practicing safe sex.  Taking low doses of aspirin every day.  Taking vitamin and mineral supplements as recommended by your health care provider. What happens during an annual well check? The services and screenings done by your health care provider during your annual well check will depend on your age, overall health, lifestyle risk factors, and family history of disease. Counseling  Your health care provider may ask you questions about your:  Alcohol  use.  Tobacco use.  Drug use.  Emotional well-being.  Home and relationship well-being.  Sexual activity.  Eating habits.  History of falls.  Memory and ability to understand (cognition).  Work and work Statistician. Screening  You may have the following tests or measurements:  Height, weight, and BMI.  Blood pressure.  Lipid and cholesterol levels. These may be checked every 5 years, or more frequently if you are over 41 years old.  Skin check.  Lung cancer screening. You may have this screening every year starting at age 56 if you have a 30-pack-year history of smoking and currently smoke or have quit within the past 15 years.  Fecal occult blood test (FOBT) of the stool. You may have this test every year starting at age 27.  Flexible sigmoidoscopy or colonoscopy. You may have a sigmoidoscopy every 5 years or a colonoscopy every 10 years starting at age 61.  Prostate cancer screening. Recommendations will vary depending on your family history and other risks.  Hepatitis C blood test.  Hepatitis B blood test.  Sexually transmitted disease (STD) testing.  Diabetes screening. This is done by checking your blood sugar (glucose) after you have not eaten for a while (fasting). You may have this done every 1-3 years.  Abdominal aortic aneurysm (AAA) screening. You may need this if you are a current or former smoker.  Osteoporosis. You may be screened starting at age 15 if you are at high risk. Talk with your health care provider about your test results, treatment options, and if necessary, the need for more tests. Vaccines  Your health care provider may recommend certain vaccines, such as:  Influenza vaccine. This is recommended every year.  Tetanus, diphtheria, and acellular pertussis (Tdap, Td) vaccine. You may need a Td booster every 10 years.  Zoster vaccine. You may need this after age 19.  Pneumococcal 13-valent conjugate (PCV13) vaccine. One dose is  recommended after age 32.  Pneumococcal polysaccharide (PPSV23) vaccine. One dose is recommended after age 51. Talk to your health care provider about which screenings and vaccines you need and how often you need them. This information is not intended to replace advice given to you by your health care provider. Make sure you discuss any questions you have with your health care provider. Document Released: 04/02/2015 Document Revised: 11/24/2015 Document Reviewed: 01/05/2015 Elsevier Interactive Patient Education  2017 [place] Prevention in the Home Falls can cause injuries. They can happen to people of all ages. There are many things you can do to make your home safe and to help prevent falls. What can I do on the outside of my home?  Regularly fix the edges of walkways and driveways and fix any cracks.  Remove anything that might make you trip as you walk through a door, such as a raised step or threshold.  Trim any bushes or trees on the path to your home.  Use bright outdoor lighting.  Clear any walking paths of anything that might make someone trip, such as rocks or tools.  Regularly check to see if handrails are loose or broken. Make sure that both sides of any steps have handrails.  Any raised decks and porches should have guardrails on the edges.  Have any leaves, snow, or ice cleared regularly.  Use sand or salt on walking paths during winter.  Clean up any spills in your garage right away. This includes oil or grease spills. What can I do in the bathroom?  Use night lights.  Install grab bars by the toilet and in the tub and shower. Do not use towel bars as grab bars.  Use non-skid mats or decals in the tub or shower.  If you need to sit down in the shower, use a plastic, non-slip stool.  Keep the floor dry. Clean up any water that spills on the floor as soon as it happens.  Remove soap buildup in the tub or shower regularly.  Attach bath mats  securely with double-sided non-slip rug tape.  Do not have throw rugs and other things on the floor that can make you trip. What can I do in the bedroom?  Use night lights.  Make sure that you have a light by your bed that is easy to reach.  Do not use any sheets or blankets that are too big for your bed. They should not hang down onto the floor.  Have a firm chair that has side arms. You can use this for support while you get dressed.  Do not have throw rugs and other things on the floor that can make you trip. What can I do in the kitchen?  Clean up any spills right away.  Avoid walking on wet floors.  Keep items that you use a lot in easy-to-reach places.  If you need to reach something above you, use a strong step stool that has a grab bar.  Keep electrical cords out of the way.  Do not use floor polish or wax that makes floors slippery. If you must use wax, use non-skid floor wax.  Do not have throw rugs and other things on the floor that  can make you trip. What can I do with my stairs?  Do not leave any items on the stairs.  Make sure that there are handrails on both sides of the stairs and use them. Fix handrails that are broken or loose. Make sure that handrails are as long as the stairways.  Check any carpeting to make sure that it is firmly attached to the stairs. Fix any carpet that is loose or worn.  Avoid having throw rugs at the top or bottom of the stairs. If you do have throw rugs, attach them to the floor with carpet tape.  Make sure that you have a light switch at the top of the stairs and the bottom of the stairs. If you do not have them, ask someone to add them for you. What else can I do to help prevent falls?  Wear shoes that:  Do not have high heels.  Have rubber bottoms.  Are comfortable and fit you well.  Are closed at the toe. Do not wear sandals.  If you use a stepladder:  Make sure that it is fully opened. Do not climb a closed  stepladder.  Make sure that both sides of the stepladder are locked into place.  Ask someone to hold it for you, if possible.  Clearly mark and make sure that you can see:  Any grab bars or handrails.  First and last steps.  Where the edge of each step is.  Use tools that help you move around (mobility aids) if they are needed. These include:  Canes.  Walkers.  Scooters.  Crutches.  Turn on the lights when you go into a dark area. Replace any light bulbs as soon as they burn out.  Set up your furniture so you have a clear path. Avoid moving your furniture around.  If any of your floors are uneven, fix them.  If there are any pets around you, be aware of where they are.  Review your medicines with your doctor. Some medicines can make you feel dizzy. This can increase your chance of falling. Ask your doctor what other things that you can do to help prevent falls. This information is not intended to replace advice given to you by your health care provider. Make sure you discuss any questions you have with your health care provider. Document Released: 12/31/2008 Document Revised: 08/12/2015 Document Reviewed: 04/10/2014 Elsevier Interactive Patient Education  2017 [employer].

## 2017-05-23 NOTE — Progress Notes (Signed)
Patient: Dennis Cooper, Male    DOB: Jan 01, 1933, 84 y.o.   MRN: [id number] Visit Date: 05/23/2017  Today's Provider: Lelon Huh, MD   Chief Complaint  Patient presents with  . Annual Exam  . Hypertension   Subjective:   Patient saw McKenzie for AWV this morning at 8:30 am.   [name] is a 84 y.o. male. He feels well. He reports exercising yes, gym 3x a week. He reports he is sleeping fairly well.  -----------------------------------------------------------   Hypertension, follow-up:  BP Readings from Last 3 Encounters:  05/23/17 (!) 170/84  05/02/17 120/70  05/15/16 132/78    He was last seen for hypertension 1 years ago.  BP at that visit was 142/90. Management since that visit includes; no changes.He reports good compliance with treatment. He is not having side effects. None  He is exercising. He is adherent to low salt diet.   Outside blood pressures are not checking. He is experiencing none.  Patient denies none.   Cardiovascular risk factors include advanced age (older than 36 for men, 1 for women).  Use of agents associated with hypertension: none.   ----------------------------------------------------------------  He does have history of prostate cancer treated at [facility] and followed with yearly PSA which he is due for. He prefers to have this checked here instead of [place]  He also has history of adenomatous polyps with last colonoscopy by Dr. Tiffany Kocher in 2012, was advised to repeat in 5 years which he is overdue for.    Review of Systems  Constitutional: Negative for chills, diaphoresis and fever.  HENT: Positive for congestion, hearing loss and sneezing. Negative for ear discharge, ear pain, nosebleeds, sore throat and tinnitus.   Eyes: Negative for photophobia, pain, discharge and redness.  Respiratory: Negative for cough, shortness of breath, wheezing and stridor.   Cardiovascular: Negative for chest pain, palpitations and  leg swelling.  Gastrointestinal: Negative for abdominal pain, blood in stool, constipation, diarrhea, nausea and vomiting.  Endocrine: Positive for polyuria. Negative for polydipsia.  Genitourinary: Negative for dysuria, flank pain, frequency, hematuria and urgency.  Musculoskeletal: Negative for back pain, myalgias and neck pain.  Skin: Negative for rash.  Allergic/Immunologic: Negative for environmental allergies.  Neurological: Negative for dizziness, tremors, seizures, weakness and headaches.  Hematological: Bruises/bleeds easily.  Psychiatric/Behavioral: Negative for hallucinations and suicidal ideas. The patient is not nervous/anxious.   All other systems reviewed and are negative.   Social History   Socioeconomic History  . Marital status: Married    Spouse name: Not on file  . Number of children: 3  . Years of education: Not on file  . Highest education level: Some college, no degree  Social Needs  . Financial resource strain: Not hard at all  . Food insecurity - worry: Never true  . Food insecurity - inability: Never true  . Transportation needs - medical: No  . Transportation needs - non-medical: No  Occupational History  . Occupation: retired  Tobacco Use  . Smoking status: Former Smoker    Types: Cigarettes    Last attempt to quit: 03/20/1978    Years since quitting: 39.2  . Smokeless tobacco: Never Used  Substance and Sexual Activity  . Alcohol use: Yes    Alcohol/week: 12.6 oz    Types: 21 Cans of beer per week  . Drug use: No  . Sexual activity: Not on file  Other Topics Concern  . Not on file  Social History Narrative  .  Not on file    Past Medical History:  Diagnosis Date  . Erectile dysfunction   . History of chicken pox   . History of measles as a child   . Hypertension      Patient Active Problem List   Diagnosis Date Noted  . History of prostate cancer 10/19/2014  . Hypertension 10/19/2014  . Erectile dysfunction 10/19/2014  . History of  colon polyps 10/19/2014  . Plantar fasciitis 10/19/2014  . Seborrhea 10/19/2014    Past Surgical History:  Procedure Laterality Date  . CHOLECYSTECTOMY  2010   Dr. Jamal Collin  . HERNIA REPAIR  2010   inguinal; Dr. Jamal Collin  . stomach ulcer  1971    His family history includes Arthritis in his brother and sister; Congestive Heart Failure in his brother; Diabetes in his brother, sister, and sister; Heart attack in his sister; Hypertension in his brother and sister.      Current Outpatient Medications:  .  amLODipine (NORVASC) 5 MG tablet, Take 1 tablet (5 mg total) by mouth daily., Disp: 90 tablet, Rfl: 2 .  aspirin EC 81 MG tablet, Take 81 mg by mouth daily., Disp: , Rfl:  .  losartan-hydrochlorothiazide (HYZAAR) 100-25 MG tablet, Take 1 tablet by mouth daily., Disp: 90 tablet, Rfl: 2 .  montelukast (SINGULAIR) 10 MG tablet, TAKE 1 TABLET (10 MG TOTAL) BY MOUTH AT BEDTIME., Disp: 30 tablet, Rfl: 5 .  Multiple Vitamins-Minerals (MULTIVITAMIN ADULT PO), Take 1 tablet by mouth daily., Disp: , Rfl:  .  NEOMYCIN-POLYMYXIN-HYDROCORTISONE (CORTISPORIN) 1 % SOLN otic solution, INSTILL 4 DROPS INTO AFFECTED EAR EVERY 6 HOURS AS NEEDED (Patient not taking: Reported on 05/23/2017), Disp: 10 mL, Rfl: 2 .  tadalafil (CIALIS) 5 MG tablet, Take 10 mg by mouth as needed for erectile dysfunction (no more than 2 tablets in a day)., Disp: , Rfl:   Patient Care Team: Birdie Sons, MD as PCP - General (Family Medicine)     Objective:   Vitals:  BP  170/84 Abnormal   (BP Location: Left Arm)     Pulse  68     Temp  97.8 F (36.6 C) (Oral)     Ht  5\' 9"  (1.753 m)     Wt  205 lb (93 kg)      BMI  30.27 kg/m       Physical Exam   General Appearance:    Alert, cooperative, no distress, appears stated age  Head:    Normocephalic, without obvious abnormality, atraumatic  Eyes:    PERRL, conjunctiva/corneas clear, EOM's intact, fundi    benign, both eyes       Ears:    Normal TM's and  external ear canals, both ears  Nose:   Nares normal, septum midline, mucosa normal, no drainage   or sinus tenderness  Throat:   Lips, mucosa, and tongue normal; teeth and gums normal  Neck:   Supple, symmetrical, trachea midline, no adenopathy;       thyroid:  No enlargement/tenderness/nodules; no carotid   bruit or JVD  Back:     Symmetric, no curvature, ROM normal, no CVA tenderness  Lungs:     Clear to auscultation bilaterally, respirations unlabored  Chest wall:    No tenderness or deformity  Heart:    Regular rate and rhythm, S1 and S2 normal, no murmur, rub   or gallop  Abdomen:     Soft, non-tender, bowel sounds active all four quadrants,    no masses,  no organomegaly  Genitalia:    deferred  Rectal:    deferred  Extremities:   Extremities normal, atraumatic, no cyanosis or edema  Pulses:   2+ and symmetric all extremities  Skin:   About 1/2 cm fleshy raise well circumscribe mole right anterior arm with few hyperpigmented areas that he is concerned about.   Lymph nodes:   Cervical, supraclavicular, and axillary nodes normal  Neurologic:   CNII-XII intact. Normal strength, sensation and reflexes      throughout    Activities of Daily Living In your present state of health, do you have any difficulty performing the following activities: 05/23/2017  Hearing? Y  Comment Pt cannot hear out of left ear. Pt is interested in hearing aids.   Vision? N  Difficulty concentrating or making decisions? N  Walking or climbing stairs? N  Dressing or bathing? N  Doing errands, shopping? N  Preparing Food and eating ? N  Using the Toilet? N  In the past six months, have you accidently leaked urine? Y  Comment Occasionally   Do you have problems with loss of bowel control? N  Managing your Medications? N  Managing your Finances? N  Housekeeping or managing your Housekeeping? N  Some recent data might be hidden    Fall Risk Assessment Fall Risk  05/23/2017 05/15/2016 05/13/2015  Falls in  the past year? No No No     Depression Screen PHQ 2/9 Scores 05/23/2017 05/23/2017 05/15/2016 05/13/2015  PHQ - 2 Score 0 0 0 0  PHQ- 9 Score 0 - - -        Assessment & Plan:    Annual Physical Reviewed patient's Family Medical History Reviewed and updated list of patient's medical providers Assessment of cognitive impairment was done Assessed patient's functional ability Established a written schedule for health screening [place] Completed and Reviewed  Exercise Activities and Dietary recommendations Goals    . Reduce alcohol intake     Recommend decreasing amount of beers consumed a day, by half.       Immunization History  Administered Date(s) Administered  . Influenza Split 01/11/2011, 12/26/2011  . Influenza, High Dose Seasonal PF 12/23/2013, 02/04/2015, 12/18/2015, 12/21/2016  . Influenza,inj,Quad PF,6+ Mos 12/07/2012  . Pneumococcal Conjugate-13 05/02/2013  . Pneumococcal Polysaccharide-23 06/22/2005  . Td 03/20/2004  . Tdap 10/04/2011  . Zoster 10/04/2011    Health Maintenance  Topic Date Due  . Samul Dada  10/03/2021  . INFLUENZA VACCINE  Completed  . PNA vac Low Risk Adult  Completed     Discussed health benefits of physical activity, and encouraged him to engage in regular exercise appropriate for his age and condition.    --------------------------------------------------------------------------  1. Annual physical exam Generally going very well.   2. Essential hypertension Well controlled.  Continue current medications.    3. History of prostate cancer  - PSA  4. History of colon polyps Overdue for follow up colonoscopy with Dr. Tiffany Kocher.  - Ambulatory referral to Gastroenterology  5. Atypical mole  - Ambulatory referral to Dermatology    Lelon Huh, MD  [facility]

## 2017-05-23 NOTE — Progress Notes (Signed)
Subjective:   Dennis Cooper is a 84 y.o. male who presents for Medicare Annual/Subsequent preventive examination.  Review of Systems:  N/A  Cardiac Risk Factors include: advanced age (>68men, >34 women);hypertension;male gender;sedentary lifestyle     Objective:    Vitals: BP (!) 170/84 (BP Location: Left Arm)   Pulse 68   Temp 97.8 F (36.6 C) (Oral)   Ht 5\' 9"  (1.753 m)   Wt 205 lb (93 kg)   BMI 30.27 kg/m   Body mass index is 30.27 kg/m.  Advanced Directives 05/23/2017  Does Patient Have a Medical Advance Directive? Yes  Type of Paramedic of [place];Living will  Copy of [place] in Chart? No - copy requested    Tobacco Social History   Tobacco Use  Smoking Status Former Smoker  . Types: Cigarettes  . Last attempt to quit: 03/20/1978  . Years since quitting: 39.2  Smokeless Tobacco Never Used     Counseling given: Not Answered   Clinical Intake:  Pre-visit preparation completed: Yes  Pain : No/denies pain Pain Score: 0-No pain     Nutritional Status: BMI > 30  Obese Nutritional Risks: None Diabetes: No  How often do you need to have someone help you when you read instructions, pamphlets, or other written materials from your doctor or pharmacy?: 1 - Never  Interpreter Needed?: No  Information entered by :: [facility], LPN  Past Medical History:  Diagnosis Date  . Erectile dysfunction   . History of chicken pox   . History of measles as a child   . Hypertension    Past Surgical History:  Procedure Laterality Date  . CHOLECYSTECTOMY  2010   Dr. Jamal Collin  . HERNIA REPAIR  2010   inguinal; Dr. Jamal Collin  . stomach ulcer  1971   Family History  Problem Relation Age of Onset  . Congestive Heart Failure Brother   . Diabetes Sister   . Heart attack Sister   . Diabetes Sister   . Arthritis Sister   . Hypertension Sister   . Diabetes Brother   . Hypertension Brother   . Arthritis Brother    Social  History   Socioeconomic History  . Marital status: Married    Spouse name: None  . Number of children: 3  . Years of education: None  . Highest education level: Some college, no degree  Social Needs  . Financial resource strain: Not hard at all  . Food insecurity - worry: Never true  . Food insecurity - inability: Never true  . Transportation needs - medical: No  . Transportation needs - non-medical: No  Occupational History  . Occupation: retired  Tobacco Use  . Smoking status: Former Smoker    Types: Cigarettes    Last attempt to quit: 03/20/1978    Years since quitting: 39.2  . Smokeless tobacco: Never Used  Substance and Sexual Activity  . Alcohol use: Yes    Alcohol/week: 12.6 oz    Types: 21 Cans of beer per week  . Drug use: No  . Sexual activity: None  Other Topics Concern  . None  Social History Narrative  . None    Outpatient Encounter Medications as of 05/23/2017  Medication Sig  . amLODipine (NORVASC) 5 MG tablet Take 1 tablet (5 mg total) by mouth daily.  Marland Kitchen aspirin EC 81 MG tablet Take 81 mg by mouth daily.  Marland Kitchen losartan-hydrochlorothiazide (HYZAAR) 100-25 MG tablet Take 1 tablet by mouth daily.  Marland Kitchen  Multiple Vitamins-Minerals (MULTIVITAMIN ADULT PO) Take 1 tablet by mouth daily.  . montelukast (SINGULAIR) 10 MG tablet TAKE 1 TABLET (10 MG TOTAL) BY MOUTH AT BEDTIME.  Marland Kitchen NEOMYCIN-POLYMYXIN-HYDROCORTISONE (CORTISPORIN) 1 % SOLN otic solution INSTILL 4 DROPS INTO AFFECTED EAR EVERY 6 HOURS AS NEEDED (Patient not taking: Reported on 05/23/2017)  . tadalafil (CIALIS) 5 MG tablet Take 10 mg by mouth as needed for erectile dysfunction (no more than 2 tablets in a day).   No facility-administered encounter medications on file as of 05/23/2017.     Activities of Daily Living In your present state of health, do you have any difficulty performing the following activities: 05/23/2017  Hearing? Y  Comment Pt cannot hear out of left ear. Pt is interested in hearing aids.   Vision?  N  Difficulty concentrating or making decisions? N  Walking or climbing stairs? N  Dressing or bathing? N  Doing errands, shopping? N  Preparing Food and eating ? N  Using the Toilet? N  In the past six months, have you accidently leaked urine? Y  Comment Occasionally   Do you have problems with loss of bowel control? N  Managing your Medications? N  Managing your Finances? N  Housekeeping or managing your Housekeeping? N  Some recent data might be hidden    Patient Care Team: Birdie Sons, MD as PCP - General (Family Medicine)   Assessment:   This is a routine wellness examination for [facility].  Exercise Activities and Dietary recommendations Current Exercise Habits: Structured exercise class, Type of exercise: strength training/weights;stretching;walking, Frequency (Times/Week): 3, Intensity: Mild  Goals    . Reduce alcohol intake     Recommend decreasing amount of beers consumed a day, by half.       Fall Risk Fall Risk  05/23/2017 05/15/2016 05/13/2015  Falls in the past year? No No No   Is the patient's home free of loose throw rugs in walkways, pet beds, electrical cords, etc?   No, pt states wife like to keep throw rugs      Grab bars in the bathroom? no      Handrails on the stairs?   no      Adequate lighting?   yes  Timed Get Up and Go Performed: N/A  Depression Screen PHQ 2/9 Scores 05/23/2017 05/23/2017 05/15/2016 05/13/2015  PHQ - 2 Score 0 0 0 0  PHQ- 9 Score 0 - - -    Cognitive Function     6CIT Screen 05/23/2017  What Year? 0 points  What month? 0 points  What time? 0 points  Count back from 20 0 points  Months in reverse 0 points  Repeat phrase 2 points  Total Score 2    Immunization History  Administered Date(s) Administered  . Influenza Split 01/11/2011, 12/26/2011  . Influenza, High Dose Seasonal PF 12/23/2013, 02/04/2015, 12/18/2015, 12/21/2016  . Influenza,inj,Quad PF,6+ Mos 12/07/2012  . Pneumococcal Conjugate-13 05/02/2013  .  Pneumococcal Polysaccharide-23 06/22/2005  . Td 03/20/2004  . Tdap 10/04/2011  . Zoster 10/04/2011    Qualifies for Shingles Vaccine? Due for Shingles vaccine. Declined my offer to administer today. Education has been provided regarding the importance of this vaccine. Pt has been advised to call her insurance company to determine her out of pocket expense. Advised she may also receive this vaccine at her local pharmacy or Health Dept. Verbalized acceptance and understanding.  Screening Tests Health Maintenance  Topic Date Due  . TETANUS/TDAP  10/03/2021  . INFLUENZA VACCINE  Completed  . PNA vac Low Risk Adult  Completed   Cancer Screenings: Lung: Low Dose CT Chest recommended if Age 71-80 years, 30 pack-year currently smoking OR have quit w/in 15years. Patient does not qualify. Colorectal: Up to date  Additional Screenings:  Hepatitis C Screening: Up to date    Plan:  I have personally reviewed and addressed the Medicare Annual Wellness questionnaire and have noted the following in the patient's chart:  A. Medical and social history B. Use of alcohol, tobacco or illicit drugs  C. Current medications and supplements D. Functional ability and status E.  Nutritional status F.  Physical activity G. Advance directives H. List of other physicians I.  Hospitalizations, surgeries, and ER visits in previous 12 months J.  [place] such as hearing and vision if needed, cognitive and depression L. Referrals and appointments - none  In addition, I have reviewed and discussed with patient certain preventive protocols, quality metrics, and best practice recommendations. A written personalized care plan for preventive services as well as general preventive health recommendations were provided to patient.  See attached scanned questionnaire for additional information.   Signed,  Fabio Neighbors, LPN Nurse Health Advisor   Nurse Recommendations: Please f/u on BP reading.  Both readings were elevated. Pt declined taking his BP medications today.

## 2017-05-23 NOTE — Patient Instructions (Signed)
   The CDC recommends two doses of Shingrix (the shingles vaccine) separated by 2 to 6 months for adults age 82 years and older. I recommend checking with your insurance plan regarding coverage for this vaccine.   

## 2017-05-24 ENCOUNTER — Telehealth: Payer: Self-pay | Admitting: *Deleted

## 2017-05-24 LAB — PSA: Prostate Specific Ag, Serum: 1 ng/mL (ref 0.0–4.0)

## 2017-05-24 NOTE — Telephone Encounter (Signed)
-----   Message from Birdie Sons, MD sent at 05/24/2017  7:40 AM EST ----- PSA is stable at 1.0 check yearly.

## 2017-05-24 NOTE — Telephone Encounter (Signed)
LMOVM for pt to return call 

## 2017-05-24 NOTE — Telephone Encounter (Signed)
Patient advised and verbally voiced understanding.  

## 2017-07-04 DIAGNOSIS — L570 Actinic keratosis: Secondary | ICD-10-CM | POA: Diagnosis not present

## 2017-07-04 DIAGNOSIS — L219 Seborrheic dermatitis, unspecified: Secondary | ICD-10-CM | POA: Diagnosis not present

## 2017-07-04 DIAGNOSIS — L812 Freckles: Secondary | ICD-10-CM | POA: Diagnosis not present

## 2017-07-04 DIAGNOSIS — L821 Other seborrheic keratosis: Secondary | ICD-10-CM | POA: Diagnosis not present

## 2017-07-04 DIAGNOSIS — L300 Nummular dermatitis: Secondary | ICD-10-CM | POA: Diagnosis not present

## 2017-07-04 DIAGNOSIS — L820 Inflamed seborrheic keratosis: Secondary | ICD-10-CM | POA: Diagnosis not present

## 2017-07-04 DIAGNOSIS — Z1283 Encounter for screening for malignant neoplasm of skin: Secondary | ICD-10-CM | POA: Diagnosis not present

## 2017-07-26 DIAGNOSIS — Z86010 Personal history of colonic polyps: Secondary | ICD-10-CM | POA: Diagnosis not present

## 2017-09-06 DIAGNOSIS — D1800 Hemangioma unspecified site: Secondary | ICD-10-CM | POA: Diagnosis not present

## 2017-09-06 DIAGNOSIS — D225 Melanocytic nevi of trunk: Secondary | ICD-10-CM | POA: Diagnosis not present

## 2017-09-06 DIAGNOSIS — L821 Other seborrheic keratosis: Secondary | ICD-10-CM | POA: Diagnosis not present

## 2017-09-06 DIAGNOSIS — L570 Actinic keratosis: Secondary | ICD-10-CM | POA: Diagnosis not present

## 2017-09-06 DIAGNOSIS — C441121 Basal cell carcinoma of skin of right upper eyelid, including canthus: Secondary | ICD-10-CM | POA: Diagnosis not present

## 2017-09-06 DIAGNOSIS — L812 Freckles: Secondary | ICD-10-CM | POA: Diagnosis not present

## 2017-09-06 DIAGNOSIS — D692 Other nonthrombocytopenic purpura: Secondary | ICD-10-CM | POA: Diagnosis not present

## 2017-09-06 DIAGNOSIS — D2371 Other benign neoplasm of skin of right lower limb, including hip: Secondary | ICD-10-CM | POA: Diagnosis not present

## 2017-09-06 DIAGNOSIS — L578 Other skin changes due to chronic exposure to nonionizing radiation: Secondary | ICD-10-CM | POA: Diagnosis not present

## 2017-10-11 ENCOUNTER — Encounter: Payer: Self-pay | Admitting: *Deleted

## 2017-10-12 ENCOUNTER — Encounter: Payer: Self-pay | Admitting: *Deleted

## 2017-10-12 ENCOUNTER — Ambulatory Visit: Payer: Medicare HMO | Admitting: Anesthesiology

## 2017-10-12 ENCOUNTER — Other Ambulatory Visit: Payer: Self-pay

## 2017-10-12 ENCOUNTER — Ambulatory Visit
Admission: RE | Admit: 2017-10-12 | Discharge: 2017-10-12 | Disposition: A | Discharge status: Home / Self Care | Payer: Medicare HMO | Source: Ambulatory Visit | Attending: Unknown Physician Specialty | Admitting: Unknown Physician Specialty

## 2017-10-12 ENCOUNTER — Encounter
Admission: RE | Disposition: A | Discharge status: Home / Self Care | Payer: Self-pay | Source: Ambulatory Visit | Attending: Unknown Physician Specialty

## 2017-10-12 DIAGNOSIS — Z7982 Long term (current) use of aspirin: Secondary | ICD-10-CM | POA: Diagnosis not present

## 2017-10-12 DIAGNOSIS — D123 Benign neoplasm of transverse colon: Secondary | ICD-10-CM | POA: Diagnosis not present

## 2017-10-12 DIAGNOSIS — Z87891 Personal history of nicotine dependence: Secondary | ICD-10-CM | POA: Insufficient documentation

## 2017-10-12 DIAGNOSIS — K621 Rectal polyp: Secondary | ICD-10-CM | POA: Diagnosis not present

## 2017-10-12 DIAGNOSIS — K640 First degree hemorrhoids: Secondary | ICD-10-CM | POA: Diagnosis not present

## 2017-10-12 DIAGNOSIS — Z86010 Personal history of colonic polyps: Secondary | ICD-10-CM | POA: Diagnosis not present

## 2017-10-12 DIAGNOSIS — Z79899 Other long term (current) drug therapy: Secondary | ICD-10-CM | POA: Diagnosis not present

## 2017-10-12 DIAGNOSIS — Z8719 Personal history of other diseases of the digestive system: Secondary | ICD-10-CM | POA: Diagnosis not present

## 2017-10-12 DIAGNOSIS — D128 Benign neoplasm of rectum: Secondary | ICD-10-CM | POA: Insufficient documentation

## 2017-10-12 DIAGNOSIS — I10 Essential (primary) hypertension: Secondary | ICD-10-CM | POA: Diagnosis not present

## 2017-10-12 DIAGNOSIS — D126 Benign neoplasm of colon, unspecified: Secondary | ICD-10-CM | POA: Diagnosis not present

## 2017-10-12 DIAGNOSIS — Z1211 Encounter for screening for malignant neoplasm of colon: Secondary | ICD-10-CM | POA: Insufficient documentation

## 2017-10-12 DIAGNOSIS — K635 Polyp of colon: Secondary | ICD-10-CM | POA: Diagnosis not present

## 2017-10-12 HISTORY — DX: Malignant (primary) neoplasm, unspecified: C80.1

## 2017-10-12 HISTORY — PX: COLONOSCOPY WITH PROPOFOL: SHX5780

## 2017-10-12 HISTORY — DX: Personal history of colonic polyps: Z86.010

## 2017-10-12 HISTORY — DX: Personal history of colon polyps, unspecified: Z86.0100

## 2017-10-12 HISTORY — DX: Plantar fascial fibromatosis: M72.2

## 2017-10-12 SURGERY — COLONOSCOPY WITH PROPOFOL
Anesthesia: General

## 2017-10-12 MED ORDER — PROPOFOL 10 MG/ML IV BOLUS
INTRAVENOUS | Status: DC | PRN
  Administered 2017-10-12: 10 mg via INTRAVENOUS
  Administered 2017-10-12: 20 mg via INTRAVENOUS

## 2017-10-12 MED ORDER — MIDAZOLAM HCL 2 MG/2ML IJ SOLN
INTRAMUSCULAR | Status: AC
  Filled 2017-10-12: qty 2

## 2017-10-12 MED ORDER — PROPOFOL 500 MG/50ML IV EMUL
INTRAVENOUS | Status: DC | PRN
  Administered 2017-10-12: 50 ug/kg/min via INTRAVENOUS

## 2017-10-12 MED ORDER — FENTANYL CITRATE (PF) 100 MCG/2ML IJ SOLN
INTRAMUSCULAR | Status: DC | PRN
  Administered 2017-10-12 (×2): 50 ug via INTRAVENOUS

## 2017-10-12 MED ORDER — MIDAZOLAM HCL 5 MG/5ML IJ SOLN
INTRAMUSCULAR | Status: DC | PRN
  Administered 2017-10-12 (×2): 1 mg via INTRAVENOUS

## 2017-10-12 MED ORDER — FENTANYL CITRATE (PF) 100 MCG/2ML IJ SOLN
INTRAMUSCULAR | Status: AC
  Filled 2017-10-12: qty 2

## 2017-10-12 MED ORDER — LIDOCAINE HCL (PF) 2 % IJ SOLN
INTRAMUSCULAR | Status: AC
  Filled 2017-10-12: qty 10

## 2017-10-12 MED ORDER — SODIUM CHLORIDE 0.9 % IV SOLN
INTRAVENOUS | Status: DC
  Administered 2017-10-12: 10:00:00 via INTRAVENOUS

## 2017-10-12 MED ORDER — PROPOFOL 500 MG/50ML IV EMUL
INTRAVENOUS | Status: AC
  Filled 2017-10-12: qty 50

## 2017-10-12 MED ORDER — LIDOCAINE HCL (PF) 2 % IJ SOLN
INTRAMUSCULAR | Status: DC | PRN
  Administered 2017-10-12: 80 mg

## 2017-10-12 NOTE — Anesthesia Preprocedure Evaluation (Signed)
Anesthesia Evaluation  Patient identified by MRN, date of birth, ID band Patient awake    Reviewed: Allergy & Precautions, NPO status , Patient's Chart, lab work & pertinent test results, reviewed documented beta blocker date and time   Airway Mallampati: II  TM Distance: >3 FB     Dental  (+) Chipped   Pulmonary former smoker,           Cardiovascular hypertension, Pt. on medications      Neuro/Psych    GI/Hepatic   Endo/Other    Renal/GU      Musculoskeletal   Abdominal   Peds  Hematology   Anesthesia Other Findings   Reproductive/Obstetrics                             Anesthesia Physical Anesthesia Plan  ASA: II  Anesthesia Plan: General   Post-op Pain Management:    Induction: Intravenous  PONV Risk Score and Plan:   Airway Management Planned:   Additional Equipment:   Intra-op Plan:   Post-operative Plan:   Informed Consent: I have reviewed the patients History and Physical, chart, labs and discussed the procedure including the risks, benefits and alternatives for the proposed anesthesia with the patient or authorized representative who has indicated his/her understanding and acceptance.     Plan Discussed with: CRNA  Anesthesia Plan Comments:         Anesthesia Quick Evaluation

## 2017-10-12 NOTE — H&P (Signed)
Primary Care Physician:  Birdie Sons, MD Primary Gastroenterologist:  Dr. Vira Agar  Pre-Procedure History & Physical: HPI:  Dennis Cooper is a 84 y.o. male is here for an colonoscopy.  Past history of colon polyps.   Past Medical History:  Diagnosis Date  . Cancer [facility])    prostate  . Erectile dysfunction   . History of chicken pox   . History of colonic polyps   . History of measles as a child   . Hypertension   . Plantar fasciitis 10/19/2014    Past Surgical History:  Procedure Laterality Date  . CHOLECYSTECTOMY  2010   Dr. Jamal Collin  . COLONOSCOPY  04/2008  . ESOPHAGOGASTRODUODENOSCOPY    . HERNIA REPAIR  2010   inguinal; Dr. Jamal Collin  . stomach ulcer  1971    Prior to Admission medications   Medication Sig Start Date End Date Taking? Authorizing Provider  amLODipine (NORVASC) 5 MG tablet Take 1 tablet (5 mg total) by mouth daily. 12/18/16  Yes Birdie Sons, MD  aspirin EC 81 MG tablet Take 81 mg by mouth daily.   Yes [provider]  losartan-hydrochlorothiazide (HYZAAR) 100-25 MG tablet Take 1 tablet by mouth daily. 12/18/16  Yes Birdie Sons, MD  Multiple Vitamins-Minerals (MULTIVITAMIN ADULT PO) Take 1 tablet by mouth daily.   Yes [provider]  NEOMYCIN-POLYMYXIN-HYDROCORTISONE (CORTISPORIN) 1 % SOLN otic solution INSTILL 4 DROPS INTO AFFECTED EAR EVERY 6 HOURS AS NEEDED 05/13/15  Yes Birdie Sons, MD  tadalafil (CIALIS) 5 MG tablet Take 10 mg by mouth as needed for erectile dysfunction (no more than 2 tablets in a day).   Yes [provider]  montelukast (SINGULAIR) 10 MG tablet TAKE 1 TABLET (10 MG TOTAL) BY MOUTH AT BEDTIME. 04/11/16 05/11/16  Birdie Sons, MD  polyethylene glycol-electrolytes (NULYTELY/GOLYTELY) 420 g solution Take 4,000 mLs by mouth once.    [provider]    Allergies as of 07/31/2017  . (No Known Allergies)    Family History  Problem Relation Age of Onset  . Congestive Heart Failure  Brother   . Diabetes Sister   . Heart attack Sister   . Diabetes Sister   . Arthritis Sister   . Hypertension Sister   . Diabetes Brother   . Hypertension Brother   . Arthritis Brother     Social History   Socioeconomic History  . Marital status: Married    Spouse name: Not on file  . Number of children: 3  . Years of education: Not on file  . Highest education level: Some college, no degree  Occupational History  . Occupation: retired  Scientific laboratory technician  . Financial resource strain: Not hard at all  . Food insecurity:    Worry: Never true    Inability: Never true  . Transportation needs:    Medical: No    Non-medical: No  Tobacco Use  . Smoking status: Former Smoker    Types: Cigarettes    Last attempt to quit: 03/20/1978    Years since quitting: 39.5  . Smokeless tobacco: Never Used  Substance and Sexual Activity  . Alcohol use: Yes    Alcohol/week: 12.6 oz    Types: 21 Cans of beer per week  . Drug use: No  . Sexual activity: Not on file  Lifestyle  . Physical activity:    Days per week: Not on file    Minutes per session: Not on file  . Stress: Only a  little  Relationships  . Social connections:    Talks on phone: Not on file    Gets together: Not on file    Attends religious service: Not on file    Active member of club or organization: Not on file    Attends meetings of clubs or organizations: Not on file    Relationship status: Not on file  . Intimate partner violence:    Fear of current or ex partner: Not on file    Emotionally abused: Not on file    Physically abused: Not on file    Forced sexual activity: Not on file  Other Topics Concern  . Not on file  Social History Narrative  . Not on file    Review of Systems: See HPI, otherwise negative ROS  Physical Exam: BP 135/80   Pulse (!) 56   Temp 97.6 F (36.4 C) (Tympanic)   Resp 18   Ht 5\' 9"  (1.753 m)   Wt 82.1 kg (181 lb)   SpO2 98%   BMI 26.73 kg/m  General:   Alert,  pleasant and  cooperative in NAD Head:  Normocephalic and atraumatic. Neck:  Supple; no masses or thyromegaly. Lungs:  Clear throughout to auscultation.    Heart:  Regular rate and rhythm. Abdomen:  Soft, nontender and nondistended. Normal bowel sounds, without guarding, and without rebound.   Neurologic:  Alert and  oriented x4;  grossly normal neurologically.  Impression/Plan: Dennis Cooper is here for an colonoscopy to be performed for Personal history of colon polyps.  Risks, benefits, limitations, and alternatives regarding  colonoscopy have been reviewed with the patient.  Questions have been answered.  All parties agreeable.   Dennis Cheers, MD  10/12/2017, 9:40 AM

## 2017-10-12 NOTE — Anesthesia Postprocedure Evaluation (Signed)
Anesthesia Post Note  Patient: Dennis Cooper  Procedure(s) Performed: COLONOSCOPY WITH PROPOFOL (N/A )  Patient location during evaluation: Endoscopy Anesthesia Type: General Level of consciousness: awake and alert Pain management: pain level controlled Vital Signs Assessment: post-procedure vital signs reviewed and stable Respiratory status: spontaneous breathing, nonlabored ventilation, respiratory function stable and patient connected to nasal cannula oxygen Cardiovascular status: blood pressure returned to baseline and stable Postop Assessment: no apparent nausea or vomiting Anesthetic complications: no     Last Vitals:  Vitals:   10/12/17 1043 10/12/17 1053  BP: 132/72 (!) 145/66  Pulse: (!) 51 (!) 45  Resp: 13 13  Temp:    SpO2: 98% 98%    Last Pain:  Vitals:   10/12/17 1053  TempSrc:   PainSc: 0-No pain                 Inis Borneman S

## 2017-10-12 NOTE — Op Note (Signed)
[facility] Patient Name: Dennis Cooper Procedure Date: 10/12/2017 9:39 AM MRN: [id number] Account #: [id number] Date of Birth: 06-Sep-1932 Admit Type: Outpatient Age: 84 Room: [facility] ENDO ROOM 3 Gender: Male Note Status: Finalized Procedure:            Colonoscopy Indications:          High risk colon cancer surveillance: Personal history                        of colonic polyps Providers:            Manya Silvas, MD Referring MD:         Kirstie Peri. Caryn Section, MD (Referring MD) Medicines:            Propofol per Anesthesia Complications:        No immediate complications. Procedure:            Pre-Anesthesia Assessment:                       - After reviewing the risks and benefits, the patient                        was deemed in satisfactory condition to undergo the                        procedure.                       After obtaining informed consent, the colonoscope was                        passed under direct vision. Throughout the procedure,                        the patient's blood pressure, pulse, and oxygen                        saturations were monitored continuously. The                        Colonoscope was introduced through the anus and                        advanced to the the cecum, identified by appendiceal                        orifice and ileocecal valve. The colonoscopy was                        performed without difficulty. The patient tolerated the                        procedure well. The quality of the bowel preparation                        was adequate to identify polyps. Findings:      A diminutive polyp was found in the hepatic flexure. The polyp was       sessile. The polyp was removed with a cold snare. Resection and       retrieval were complete.      Two sessile  polyps were found in the rectum. The polyps were diminutive       in size. These polyps were removed with a jumbo cold forceps. Resection   and retrieval were complete.      A small polyp was found in the rectum. The polyp was sessile. The polyp       was removed with a cold snare. Resection and retrieval were complete.      Internal hemorrhoids were found during endoscopy. The hemorrhoids were       small and Grade I (internal hemorrhoids that do not prolapse).      The exam was otherwise without abnormality. Impression:           - One diminutive polyp at the hepatic flexure, removed                        with a cold snare. Resected and retrieved.                       - Two diminutive polyps in the rectum, removed with a                        jumbo cold forceps. Resected and retrieved.                       - One small polyp in the rectum, removed with a cold                        snare. Resected and retrieved.                       - Internal hemorrhoids.                       - The examination was otherwise normal. Recommendation:       - Await pathology results. Manya Silvas, MD 10/12/2017 10:21:08 AM This report has been signed electronically. Number of Addenda: 0 Note Initiated On: 10/12/2017 9:39 AM Scope Withdrawal Time: 0 hours 9 minutes 38 seconds  Total Procedure Duration: 0 hours 22 minutes 48 seconds       [facility]

## 2017-10-12 NOTE — Anesthesia Post-op Follow-up Note (Signed)
Anesthesia QCDR form completed.        

## 2017-10-12 NOTE — Transfer of Care (Signed)
Immediate Anesthesia Transfer of Care Note  Patient: Dennis Cooper  Procedure(s) Performed: COLONOSCOPY WITH PROPOFOL (N/A )  Patient Location: PACU  Anesthesia Type:General  Level of Consciousness: sedated  Airway & Oxygen Therapy: Patient Spontanous Breathing and Patient connected to nasal cannula oxygen  Post-op Assessment: Report given to RN and Post -op Vital signs reviewed and stable  Post vital signs: Reviewed and stable  Last Vitals:  Vitals Value Taken Time  BP    Temp    Pulse    Resp    SpO2      Last Pain:  Vitals:   10/12/17 0935  TempSrc: Tympanic  PainSc: 0-No pain         Complications: No apparent anesthesia complications

## 2017-10-15 ENCOUNTER — Encounter: Payer: Self-pay | Admitting: Unknown Physician Specialty

## 2017-10-15 ENCOUNTER — Other Ambulatory Visit: Payer: Self-pay | Admitting: Family Medicine

## 2017-10-15 LAB — SURGICAL PATHOLOGY

## 2017-11-06 DIAGNOSIS — L570 Actinic keratosis: Secondary | ICD-10-CM | POA: Diagnosis not present

## 2017-12-27 DIAGNOSIS — D485 Neoplasm of uncertain behavior of skin: Secondary | ICD-10-CM | POA: Diagnosis not present

## 2017-12-27 DIAGNOSIS — L578 Other skin changes due to chronic exposure to nonionizing radiation: Secondary | ICD-10-CM | POA: Diagnosis not present

## 2017-12-27 DIAGNOSIS — C4441 Basal cell carcinoma of skin of scalp and neck: Secondary | ICD-10-CM | POA: Diagnosis not present

## 2017-12-27 DIAGNOSIS — D2371 Other benign neoplasm of skin of right lower limb, including hip: Secondary | ICD-10-CM | POA: Diagnosis not present

## 2017-12-27 DIAGNOSIS — L821 Other seborrheic keratosis: Secondary | ICD-10-CM | POA: Diagnosis not present

## 2017-12-27 DIAGNOSIS — L570 Actinic keratosis: Secondary | ICD-10-CM | POA: Diagnosis not present

## 2017-12-27 DIAGNOSIS — Z85828 Personal history of other malignant neoplasm of skin: Secondary | ICD-10-CM

## 2017-12-27 HISTORY — DX: Personal history of other malignant neoplasm of skin: Z85.828

## 2018-01-10 ENCOUNTER — Ambulatory Visit (INDEPENDENT_AMBULATORY_CARE_PROVIDER_SITE_OTHER): Payer: Medicare HMO

## 2018-01-10 DIAGNOSIS — Z23 Encounter for immunization: Secondary | ICD-10-CM | POA: Diagnosis not present

## 2018-05-24 ENCOUNTER — Ambulatory Visit (INDEPENDENT_AMBULATORY_CARE_PROVIDER_SITE_OTHER): Payer: Medicare HMO | Admitting: Physician Assistant

## 2018-05-24 ENCOUNTER — Encounter: Payer: Self-pay | Admitting: Physician Assistant

## 2018-05-24 VITALS — BP 156/73 | HR 62 | Temp 97.6°F | Wt 183.2 lb

## 2018-05-24 DIAGNOSIS — H1032 Unspecified acute conjunctivitis, left eye: Secondary | ICD-10-CM | POA: Diagnosis not present

## 2018-05-24 MED ORDER — OFLOXACIN 0.3 % OP SOLN: 1.0000 [drp] | Freq: Four times a day (QID) | OPHTHALMIC | 0 refills | Status: AC

## 2018-05-24 NOTE — Progress Notes (Signed)
Patient: Dennis Cooper Male    DOB: 10-Feb-1933   85 y.o.   MRN: [id number] Visit Date: 05/24/2018  Today's Provider: Trinna Post, PA-C   Chief Complaint  Patient presents with  . Eye Problem   Subjective:    Eye Problem   The left eye is affected. The current episode started yesterday. The problem occurs constantly. The problem has been unchanged. There was no injury mechanism. The pain is at a severity of 0/10. The patient is experiencing no pain. There is no known exposure to pink eye. He does not wear contacts. Associated symptoms include eye redness. Pertinent negatives include no blurred vision, eye discharge or itching. Associated symptoms comments: Watery and swelling under the eye per patient.. He has tried nothing for the symptoms.      No Known Allergies   Current Outpatient Medications:  .  amLODipine (NORVASC) 5 MG tablet, TAKE 1 TABLET (5 MG TOTAL) BY MOUTH DAILY., Disp: 90 tablet, Rfl: 4 .  aspirin EC 81 MG tablet, Take 81 mg by mouth daily., Disp: , Rfl:  .  losartan-hydrochlorothiazide (HYZAAR) 100-25 MG tablet, TAKE 1 TABLET EVERY DAY, Disp: 90 tablet, Rfl: 4 .  Multiple Vitamins-Minerals (MULTIVITAMIN ADULT PO), Take 1 tablet by mouth daily., Disp: , Rfl:  .  NEOMYCIN-POLYMYXIN-HYDROCORTISONE (CORTISPORIN) 1 % SOLN otic solution, INSTILL 4 DROPS INTO AFFECTED EAR EVERY 6 HOURS AS NEEDED, Disp: 10 mL, Rfl: 2 .  polyethylene glycol-electrolytes (NULYTELY/GOLYTELY) 420 g solution, Take 4,000 mLs by mouth once., Disp: , Rfl:  .  tadalafil (CIALIS) 5 MG tablet, Take 10 mg by mouth as needed for erectile dysfunction (no more than 2 tablets in a day)., Disp: , Rfl:  .  montelukast (SINGULAIR) 10 MG tablet, TAKE 1 TABLET (10 MG TOTAL) BY MOUTH AT BEDTIME., Disp: 30 tablet, Rfl: 5  Review of Systems  Eyes: Positive for redness. Negative for blurred vision, discharge and itching.    Social History   Tobacco Use  . Smoking status: Former Smoker    Types:  Cigarettes    Last attempt to quit: 03/20/1978    Years since quitting: 40.2  . Smokeless tobacco: Never Used  Substance Use Topics  . Alcohol use: Yes    Alcohol/week: 21.0 standard drinks    Types: 21 Cans of beer per week      Objective:   BP (!) 156/73 (BP Location: Left Arm, Patient Position: Sitting, Cuff Size: Normal)   Pulse 62   Temp 97.6 F (36.4 C) (Oral)   Wt 183 lb 3.2 oz (83.1 kg)   SpO2 96%   BMI 27.05 kg/m  Vitals:   05/24/18 1121  BP: (!) 156/73  Pulse: 62  Temp: 97.6 F (36.4 C)  TempSrc: Oral  SpO2: 96%  Weight: 183 lb 3.2 oz (83.1 kg)     Physical Exam Constitutional:      Appearance: Normal appearance.  Eyes:     General: Lids are normal. Lids are everted, no foreign bodies appreciated. Vision grossly intact. Gaze aligned appropriately.        Right eye: No discharge.        Left eye: Discharge present.    Extraocular Movements: Extraocular movements intact.     Conjunctiva/sclera:     Right eye: Right conjunctiva is not injected.     Left eye: Left conjunctiva is injected.     Pupils: Pupils are equal, round, and reactive to light.  Cardiovascular:  Rate and Rhythm: Normal rate.  Pulmonary:     Effort: Pulmonary effort is normal.  Neurological:     Mental Status: He is alert.         Assessment & Plan    1. Acute conjunctivitis of left eye, unspecified acute conjunctivitis type  - ofloxacin (OCUFLOX) 0.3 % ophthalmic solution; Place 1 drop into the left eye 4 (four) times daily for 7 days.  Dispense: 1.4 mL; Refill: 0  The entirety of the information documented in the History of Present Illness, Review of Systems and Physical Exam were personally obtained by me. Portions of this information were initially documented by Lyndel Pleasure, CMA and reviewed by me for thoroughness and accuracy.   F/u PRN.        Trinna Post, PA-C  [facility]

## 2018-05-24 NOTE — Patient Instructions (Signed)
Bacterial Conjunctivitis, Adult  Bacterial conjunctivitis is an infection of the clear membrane that covers the white part of your eye and the inner surface of your eyelid (conjunctiva). When the blood vessels in your conjunctiva become inflamed, your eye becomes red or pink, and it will probably feel itchy. Bacterial conjunctivitis spreads very easily from person to person (is contagious). It also spreads easily from one eye to the other eye.  What are the causes?  This condition is caused by bacteria. You may get the infection if you come into close contact with:   A person who is infected with the bacteria.   Items that are contaminated with the bacteria, such as a face towel, contact lens solution, or eye makeup.  What increases the risk?  You are more likely to develop this condition if you:   Are exposed to other people who have the infection.   Wear contact lenses.   Have a sinus infection.   Have had a recent eye injury or surgery.   Have a weak body defense system (immune system).   Have a medical condition that causes dry eyes.  What are the signs or symptoms?  Symptoms of this condition include:   Thick, yellowish discharge from the eye. This may turn into a crust on the eyelid overnight and cause your eyelids to stick together.   Tearing or watery eyes.   Itchy eyes.   Burning feeling in your eyes.   Eye redness.   Swollen eyelids.   Blurred vision.  How is this diagnosed?  This condition is diagnosed based on your symptoms and medical history. Your health care provider may also take a sample of discharge from your eye to find the cause of your infection. This is rarely done.  How is this treated?  This condition may be treated with:   Antibiotic eye drops or ointment to clear the infection more quickly and prevent the spread of infection to others.   Oral antibiotic medicines to treat infections that do not respond to drops or ointments or that last longer than 10 days.   Cool, wet  cloths (cool compresses) placed on the eyes.   Artificial tears applied 2-6 times a day.  Follow these instructions at home:  Medicines   Take or apply your antibiotic medicine as told by your health care provider. Do not stop taking or applying the antibiotic even if you start to feel better.   Take or apply over-the-counter and prescription medicines only as told by your health care provider.   Be very careful to avoid touching the edge of your eyelid with the eye-drop bottle or the ointment tube when you apply medicines to the affected eye. This will keep you from spreading the infection to your other eye or to other people.  Managing discomfort   Gently wipe away any drainage from your eye with a warm, wet washcloth or a cotton ball.   Apply a clean, cool compress to your eye for 10-20 minutes, 3-4 times a day.  General instructions   Do not wear contact lenses until the inflammation is gone and your health care provider says it is safe to wear them again. Ask your health care provider how to sterilize or replace your contact lenses before you use them again. Wear glasses until you can resume wearing contact lenses.   Avoid wearing eye makeup until the inflammation is gone. Throw away any old eye cosmetics that may be contaminated.   Change or wash   your pillowcase every day.   Do not share towels or washcloths. This may spread the infection.   Wash your hands often with soap and water. Use paper towels to dry your hands.   Avoid touching or rubbing your eyes.   Do not drive or use heavy machinery if your vision is blurred.  Contact a health care provider if:   You have a fever.   Your symptoms do not get better after 10 days.  Get help right away if you have:   A fever and your symptoms suddenly get worse.   Severe pain when you move your eye.   Facial pain, redness, or swelling.   Sudden loss of vision.  Summary   Bacterial conjunctivitis is an infection of the clear membrane that covers the  white part of your eye and the inner surface of your eyelid (conjunctiva).   Bacterial conjunctivitis spreads very easily from person to person (is contagious).   Wash your hands often with soap and water. Use paper towels to dry your hands.   Take or apply your antibiotic medicine as told by your health care provider. Do not stop taking or applying the antibiotic even if you start to feel better.   Contact a health care provider if you have a fever or your symptoms do not get better after 10 days.  This information is not intended to replace advice given to you by your health care provider. Make sure you discuss any questions you have with your health care provider.  Document Released: 03/06/2005 Document Revised: 10/10/2017 Document Reviewed: 10/10/2017  Elsevier Interactive Patient Education  2019 Elsevier Inc.

## 2018-05-27 ENCOUNTER — Ambulatory Visit (INDEPENDENT_AMBULATORY_CARE_PROVIDER_SITE_OTHER): Payer: Medicare HMO

## 2018-05-27 VITALS — BP 122/66 | HR 51 | Temp 97.5°F | Ht 69.0 in | Wt 181.2 lb

## 2018-05-27 DIAGNOSIS — Z0000 Encounter for general adult medical examination without abnormal findings: Secondary | ICD-10-CM

## 2018-05-27 NOTE — Progress Notes (Signed)
Subjective:   Dennis Cooper is a 85 y.o. male who presents for Medicare Annual/Subsequent preventive examination.   Review of Systems:  N/A  Cardiac Risk Factors include: advanced age (>34men, >63 women);hypertension;male gender     Objective:    Vitals: BP 122/66 (BP Location: Right Arm)   Pulse (!) 51   Temp (!) 97.5 F (36.4 C) (Oral)   Ht 5\' 9"  (1.753 m)   Wt 181 lb 3.2 oz (82.2 kg)   BMI 26.76 kg/m   Body mass index is 26.76 kg/m.  Advanced Directives 05/27/2018 10/12/2017 05/23/2017  Does Patient Have a Medical Advance Directive? Yes Yes Yes  Type of Paramedic of [place];Living will [place];Living will [place];Living will  Copy of [place] in Chart? No - copy requested No - copy requested No - copy requested    Tobacco Social History   Tobacco Use  Smoking Status Former Smoker  . Types: Cigarettes  . Last attempt to quit: 03/20/1978  . Years since quitting: 40.2  Smokeless Tobacco Never Used     Counseling given: Not Answered   Clinical Intake:  Pre-visit preparation completed: Yes  Pain : No/denies pain Pain Score: 0-No pain     Nutritional Status: BMI 25 -29 Overweight Nutritional Risks: None Diabetes: No  How often do you need to have someone help you when you read instructions, pamphlets, or other written materials from your doctor or pharmacy?: 1 - Never  Interpreter Needed?: No  Information entered by :: [facility], LPN  Past Medical History:  Diagnosis Date  . Cancer [facility])    prostate  . Erectile dysfunction   . History of chicken pox   . History of colonic polyps   . History of measles as a child   . Hypertension   . Plantar fasciitis 10/19/2014   Past Surgical History:  Procedure Laterality Date  . CHOLECYSTECTOMY  2010   Dr. Jamal Collin  . COLONOSCOPY  04/2008  . COLONOSCOPY WITH PROPOFOL N/A 10/12/2017   Procedure: COLONOSCOPY WITH PROPOFOL;   Surgeon: Manya Silvas, MD;  Location: [facility] ENDOSCOPY;  Service: Endoscopy;  Laterality: N/A;  . ESOPHAGOGASTRODUODENOSCOPY    . HERNIA REPAIR  2010   inguinal; Dr. Jamal Collin  . stomach ulcer  1971   Family History  Problem Relation Age of Onset  . Congestive Heart Failure Brother   . Diabetes Sister   . Heart attack Sister   . Diabetes Sister   . Arthritis Sister   . Hypertension Sister   . Diabetes Brother   . Hypertension Brother   . Arthritis Brother    Social History   Socioeconomic History  . Marital status: Married    Spouse name: Not on file  . Number of children: 3  . Years of education: Not on file  . Highest education level: Some college, no degree  Occupational History  . Occupation: retired  Scientific laboratory technician  . Financial resource strain: Not hard at all  . Food insecurity:    Worry: Never true    Inability: Never true  . Transportation needs:    Medical: No    Non-medical: No  Tobacco Use  . Smoking status: Former Smoker    Types: Cigarettes    Last attempt to quit: 03/20/1978    Years since quitting: 40.2  . Smokeless tobacco: Never Used  Substance and Sexual Activity  . Alcohol use: Yes    Alcohol/week: 1.0 - 2.0  standard drinks    Types: 1 - 2 Cans of beer per week  . Drug use: No  . Sexual activity: Not on file  Lifestyle  . Physical activity:    Days per week: 6 days    Minutes per session: 60 min  . Stress: Not at all  Relationships  . Social connections:    Talks on phone: Patient refused    Gets together: Patient refused    Attends religious service: Patient refused    Active member of club or organization: Patient refused    Attends meetings of clubs or organizations: Patient refused    Relationship status: Patient refused  Other Topics Concern  . Not on file  Social History Narrative  . Not on file    Outpatient Encounter Medications as of 05/27/2018  Medication Sig  . amLODipine (NORVASC) 5 MG tablet TAKE 1 TABLET (5 MG TOTAL) BY  MOUTH DAILY.  Marland Kitchen aspirin EC 81 MG tablet Take 81 mg by mouth daily.  Marland Kitchen losartan-hydrochlorothiazide (HYZAAR) 100-25 MG tablet TAKE 1 TABLET EVERY DAY  . Multiple Vitamins-Minerals (MULTIVITAMIN ADULT PO) Take 1 tablet by mouth daily.  Marland Kitchen ofloxacin (OCUFLOX) 0.3 % ophthalmic solution Place 1 drop into the left eye 4 (four) times daily for 7 days.  . tadalafil (CIALIS) 5 MG tablet Take 10 mg by mouth as needed for erectile dysfunction (no more than 2 tablets in a day).  . montelukast (SINGULAIR) 10 MG tablet TAKE 1 TABLET (10 MG TOTAL) BY MOUTH AT BEDTIME.  Marland Kitchen NEOMYCIN-POLYMYXIN-HYDROCORTISONE (CORTISPORIN) 1 % SOLN otic solution INSTILL 4 DROPS INTO AFFECTED EAR EVERY 6 HOURS AS NEEDED (Patient not taking: Reported on 05/27/2018)  . polyethylene glycol-electrolytes (NULYTELY/GOLYTELY) 420 g solution Take 4,000 mLs by mouth once.   No facility-administered encounter medications on file as of 05/27/2018.     Activities of Daily Living In your present state of health, do you have any difficulty performing the following activities: 05/27/2018  Hearing? Y  Comment Does not wear hearing aids.   Vision? N  Difficulty concentrating or making decisions? N  Walking or climbing stairs? N  Dressing or bathing? N  Doing errands, shopping? N  Preparing Food and eating ? N  Using the Toilet? N  In the past six months, have you accidently leaked urine? N  Do you have problems with loss of bowel control? N  Managing your Medications? N  Managing your Finances? N  Housekeeping or managing your Housekeeping? N  Some recent data might be hidden    Patient Care Team: Birdie Sons, MD as PCP - General (Family Medicine)   Assessment:   This is a routine wellness examination for [facility].  Exercise Activities and Dietary recommendations Current Exercise Habits: Structured exercise class, Type of exercise: strength training/weights;walking, Time (Minutes): 60, Frequency (Times/Week): 6, Weekly Exercise  (Minutes/Week): 360, Intensity: Mild, Exercise limited by: None identified  Goals    . DIET - REDUCE SUGAR INTAKE     Continue current diet plan of avoiding all sweets to help aid with weight loss.     . Reduce alcohol intake     Recommend decreasing amount of beers consumed a day, by half.       Fall Risk Fall Risk  05/27/2018 05/23/2017 05/15/2016 05/13/2015  Falls in the past year? 0 No No No   FALL RISK PREVENTION PERTAINING TO THE HOME: Any stairs in or around the home? No  If so, do they handrails? N/A  Home free of  loose throw rugs in walkways, pet beds, electrical cords, etc? Yes  Adequate lighting in your home to reduce risk of falls? Yes   ASSISTIVE DEVICES UTILIZED TO PREVENT FALLS:  Life alert? No  Use of a cane, walker or w/c? No  Grab bars in the bathroom? No  Shower chair or bench in shower? No  Elevated toilet seat or a handicapped toilet? No    TIMED UP AND GO:  Was the test performed? No .    Depression Screen PHQ 2/9 Scores 05/27/2018 05/27/2018 05/23/2017 05/23/2017  PHQ - 2 Score 0 0 0 0  PHQ- 9 Score 0 - 0 -    Cognitive Function     6CIT Screen 05/27/2018 05/23/2017  What Year? 0 points 0 points  What month? 0 points 0 points  What time? 0 points 0 points  Count back from 20 0 points 0 points  Months in reverse 0 points 0 points  Repeat phrase 2 points 2 points  Total Score 2 2    Immunization History  Administered Date(s) Administered  . Influenza Split 01/11/2011, 12/26/2011  . Influenza, High Dose Seasonal PF 12/23/2013, 02/04/2015, 12/18/2015, 12/21/2016, 01/10/2018  . Influenza,inj,Quad PF,6+ Mos 12/07/2012  . Pneumococcal Conjugate-13 05/02/2013  . Pneumococcal Polysaccharide-23 06/22/2005  . Td 03/20/2004  . Tdap 10/04/2011  . Zoster 10/04/2011    Qualifies for Shingles Vaccine? Yes  Zostavax completed 09/24/11. Due for Shingrix. Education has been provided regarding the importance of this vaccine. Pt has been advised to call insurance  company to determine out of pocket expense. Advised may also receive vaccine at local pharmacy or Health Dept. Verbalized acceptance and understanding.  Tdap: Up to date  Flu Vaccine: Up to date  Pneumococcal Vaccine: Up to date  Screening Tests Health Maintenance  Topic Date Due  . TETANUS/TDAP  10/03/2021  . COLONOSCOPY  10/13/2022  . INFLUENZA VACCINE  Completed  . PNA vac Low Risk Adult  Completed   Cancer Screenings:  Colorectal Screening: Completed 10/12/17. Repeat every 5 years.  Lung Cancer Screening: (Low Dose CT Chest recommended if Age 50-80 years, 30 pack-year currently smoking OR have quit w/in 15years.) does not qualify.    Additional Screening:  Vision Screening: Recommended annual ophthalmology exams for early detection of glaucoma and other disorders of the eye.  Dental Screening: Recommended annual dental exams for proper oral hygiene  Community Resource Referral:  CRR required this visit?  No       Plan:  I have personally reviewed and addressed the Medicare Annual Wellness questionnaire and have noted the following in the patient's chart:  A. Medical and social history B. Use of alcohol, tobacco or illicit drugs  C. Current medications and supplements D. Functional ability and status E.  Nutritional status F.  Physical activity G. Advance directives H. List of other physicians I.  Hospitalizations, surgeries, and ER visits in previous 12 months J.  [place] such as hearing and vision if needed, cognitive and depression L. Referrals and appointments - none  In addition, I have reviewed and discussed with patient certain preventive protocols, quality metrics, and best practice recommendations. A written personalized care plan for preventive services as well as general preventive health recommendations were provided to patient.  See attached scanned questionnaire for additional information.   Signed,  Fabio Neighbors, LPN Nurse  Health Advisor   Nurse Recommendations: None.

## 2018-05-27 NOTE — Patient Instructions (Addendum)
[name] , Thank you for taking time to come for your Medicare Wellness Visit. I appreciate your ongoing commitment to your health goals. Please review the following plan we discussed and let me know if I can assist you in the future.   Screening recommendations/referrals: Colonoscopy: Up to date, due 03/2022 Recommended yearly ophthalmology/optometry visit for glaucoma screening and checkup Recommended yearly dental visit for hygiene and checkup  Vaccinations: Influenza vaccine: Up to date Pneumococcal vaccine: Completed series Tdap vaccine: Up to date, due 03/2021 Shingles vaccine: Pt declines today.     Advanced directives: Please bring a copy of your POA (Power of Attorney) and/or Living Will to your next appointment.   Conditions/risks identified: Continue current diet plan of avoiding all sweets to help aid with weight loss.   Next appointment: 05/28/18 @ 9:00 AM with Dr Caryn Section.   Preventive Care 47 Years and Older, Male Preventive care refers to lifestyle choices and visits with your health care provider that can promote health and wellness. What does preventive care include?  A yearly physical exam. This is also called an annual well check.  Dental exams once or twice a year.  Routine eye exams. Ask your health care provider how often you should have your eyes checked.  Personal lifestyle choices, including:  Daily care of your teeth and gums.  Regular physical activity.  Eating a healthy diet.  Avoiding tobacco and drug use.  Limiting alcohol use.  Practicing safe sex.  Taking low doses of aspirin every day.  Taking vitamin and mineral supplements as recommended by your health care provider. What happens during an annual well check? The services and screenings done by your health care provider during your annual well check will depend on your age, overall health, lifestyle risk factors, and family history of disease. Counseling  Your health care provider may  ask you questions about your:  Alcohol use.  Tobacco use.  Drug use.  Emotional well-being.  Home and relationship well-being.  Sexual activity.  Eating habits.  History of falls.  Memory and ability to understand (cognition).  Work and work Statistician. Screening  You may have the following tests or measurements:  Height, weight, and BMI.  Blood pressure.  Lipid and cholesterol levels. These may be checked every 5 years, or more frequently if you are over 70 years old.  Skin check.  Lung cancer screening. You may have this screening every year starting at age 5 if you have a 30-pack-year history of smoking and currently smoke or have quit within the past 15 years.  Fecal occult blood test (FOBT) of the stool. You may have this test every year starting at age 16.  Flexible sigmoidoscopy or colonoscopy. You may have a sigmoidoscopy every 5 years or a colonoscopy every 10 years starting at age 33.  Prostate cancer screening. Recommendations will vary depending on your family history and other risks.  Hepatitis C blood test.  Hepatitis B blood test.  Sexually transmitted disease (STD) testing.  Diabetes screening. This is done by checking your blood sugar (glucose) after you have not eaten for a while (fasting). You may have this done every 1-3 years.  Abdominal aortic aneurysm (AAA) screening. You may need this if you are a current or former smoker.  Osteoporosis. You may be screened starting at age 4 if you are at high risk. Talk with your health care provider about your test results, treatment options, and if necessary, the need for more tests. Vaccines  Your health  care provider may recommend certain vaccines, such as:  Influenza vaccine. This is recommended every year.  Tetanus, diphtheria, and acellular pertussis (Tdap, Td) vaccine. You may need a Td booster every 10 years.  Zoster vaccine. You may need this after age 23.  Pneumococcal 13-valent  conjugate (PCV13) vaccine. One dose is recommended after age 79.  Pneumococcal polysaccharide (PPSV23) vaccine. One dose is recommended after age 70. Talk to your health care provider about which screenings and vaccines you need and how often you need them. This information is not intended to replace advice given to you by your health care provider. Make sure you discuss any questions you have with your health care provider. Document Released: 04/02/2015 Document Revised: 11/24/2015 Document Reviewed: 01/05/2015 Elsevier Interactive Patient Education  2017 [place] Prevention in the Home Falls can cause injuries. They can happen to people of all ages. There are many things you can do to make your home safe and to help prevent falls. What can I do on the outside of my home?  Regularly fix the edges of walkways and driveways and fix any cracks.  Remove anything that might make you trip as you walk through a door, such as a raised step or threshold.  Trim any bushes or trees on the path to your home.  Use bright outdoor lighting.  Clear any walking paths of anything that might make someone trip, such as rocks or tools.  Regularly check to see if handrails are loose or broken. Make sure that both sides of any steps have handrails.  Any raised decks and porches should have guardrails on the edges.  Have any leaves, snow, or ice cleared regularly.  Use sand or salt on walking paths during winter.  Clean up any spills in your garage right away. This includes oil or grease spills. What can I do in the bathroom?  Use night lights.  Install grab bars by the toilet and in the tub and shower. Do not use towel bars as grab bars.  Use non-skid mats or decals in the tub or shower.  If you need to sit down in the shower, use a plastic, non-slip stool.  Keep the floor dry. Clean up any water that spills on the floor as soon as it happens.  Remove soap buildup in the tub or  shower regularly.  Attach bath mats securely with double-sided non-slip rug tape.  Do not have throw rugs and other things on the floor that can make you trip. What can I do in the bedroom?  Use night lights.  Make sure that you have a light by your bed that is easy to reach.  Do not use any sheets or blankets that are too big for your bed. They should not hang down onto the floor.  Have a firm chair that has side arms. You can use this for support while you get dressed.  Do not have throw rugs and other things on the floor that can make you trip. What can I do in the kitchen?  Clean up any spills right away.  Avoid walking on wet floors.  Keep items that you use a lot in easy-to-reach places.  If you need to reach something above you, use a strong step stool that has a grab bar.  Keep electrical cords out of the way.  Do not use floor polish or wax that makes floors slippery. If you must use wax, use non-skid floor wax.  Do not have  throw rugs and other things on the floor that can make you trip. What can I do with my stairs?  Do not leave any items on the stairs.  Make sure that there are handrails on both sides of the stairs and use them. Fix handrails that are broken or loose. Make sure that handrails are as long as the stairways.  Check any carpeting to make sure that it is firmly attached to the stairs. Fix any carpet that is loose or worn.  Avoid having throw rugs at the top or bottom of the stairs. If you do have throw rugs, attach them to the floor with carpet tape.  Make sure that you have a light switch at the top of the stairs and the bottom of the stairs. If you do not have them, ask someone to add them for you. What else can I do to help prevent falls?  Wear shoes that:  Do not have high heels.  Have rubber bottoms.  Are comfortable and fit you well.  Are closed at the toe. Do not wear sandals.  If you use a stepladder:  Make sure that it is fully  opened. Do not climb a closed stepladder.  Make sure that both sides of the stepladder are locked into place.  Ask someone to hold it for you, if possible.  Clearly mark and make sure that you can see:  Any grab bars or handrails.  First and last steps.  Where the edge of each step is.  Use tools that help you move around (mobility aids) if they are needed. These include:  Canes.  Walkers.  Scooters.  Crutches.  Turn on the lights when you go into a dark area. Replace any light bulbs as soon as they burn out.  Set up your furniture so you have a clear path. Avoid moving your furniture around.  If any of your floors are uneven, fix them.  If there are any pets around you, be aware of where they are.  Review your medicines with your doctor. Some medicines can make you feel dizzy. This can increase your chance of falling. Ask your doctor what other things that you can do to help prevent falls. This information is not intended to replace advice given to you by your health care provider. Make sure you discuss any questions you have with your health care provider. Document Released: 12/31/2008 Document Revised: 08/12/2015 Document Reviewed: 04/10/2014 Elsevier Interactive Patient Education  2017 [employer].

## 2018-05-28 ENCOUNTER — Ambulatory Visit (INDEPENDENT_AMBULATORY_CARE_PROVIDER_SITE_OTHER): Payer: Medicare HMO | Admitting: Family Medicine

## 2018-05-28 ENCOUNTER — Encounter: Payer: Self-pay | Admitting: Family Medicine

## 2018-05-28 VITALS — BP 144/80 | HR 52 | Temp 97.4°F | Resp 16 | Wt 183.0 lb

## 2018-05-28 DIAGNOSIS — Z8546 Personal history of malignant neoplasm of prostate: Secondary | ICD-10-CM | POA: Diagnosis not present

## 2018-05-28 DIAGNOSIS — I10 Essential (primary) hypertension: Secondary | ICD-10-CM

## 2018-05-28 NOTE — Patient Instructions (Addendum)
Please go to the lab draw station in [address] on the second floor of [facility] when you are fasting. Normal hours are 8:00am to 12:30pm and 1:30pm to 4:00pm Monday through Friday  . Please review the attached list of medications and notify my office if there are any errors.   . Please contact your eyecare professional to schedule a routine eye exam   The CDC recommends two doses of Shingrix (the shingles vaccine) separated by 2 to 6 months for adults age 26 years and older. I recommend checking with your pharmacy plan regarding coverage for this vaccine.

## 2018-05-28 NOTE — Progress Notes (Signed)
Patient: Dennis Cooper, Male    DOB: [birth year], 85 y.o.   MRN: [id number] Visit Date: 05/28/2018  Today's Provider: Lelon Huh, MD   Chief Complaint  Patient presents with  . Annual Exam  . Hypertension   Subjective:     [name] is a 85 y.o. male. He feels fairly well. He reports exercising 3 times a week. He reports he is sleeping fairly well.  -----------------------------------------------------------  Hypertension, follow-up:  BP Readings from Last 3 Encounters:  05/28/18 (!) 148/82  05/27/18 122/66  05/24/18 (!) 156/73    He was last seen for hypertension 1 years ago.  BP at that visit was 170/84. Management since that visit includes no changes. He reports good compliance with treatment. He is not having side effects.  He is exercising. He is not adherent to low salt diet.   Outside blood pressures are . He is experiencing none.  Patient denies chest pain, chest pressure/discomfort, claudication, dyspnea, exertional chest pressure/discomfort, fatigue, irregular heart beat, lower extremity edema, near-syncope, orthopnea, palpitations, paroxysmal nocturnal dyspnea, syncope and tachypnea.   Cardiovascular risk factors include advanced age (older than 16 for men, 75 for women), hypertension and male gender.  Use of agents associated with hypertension: NSAIDS.     Weight trend: decreasing steadily Wt Readings from Last 3 Encounters:  05/28/18 183 lb (83 kg)  05/27/18 181 lb 3.2 oz (82.2 kg)  05/24/18 183 lb 3.2 oz (83.1 kg)    Current diet: in general, a "healthy" diet    ------------------------------------------------------------------------  Follow up for History of Prostate Cancer:  The patient was last seen for this 1 years ago. Changes made at last visit include none.  He reports good compliance with treatment. He feels that condition is Unchanged. He is not having side effects.    ------------------------------------------------------------------------------------   Review of Systems  Constitutional: Negative for appetite change, chills, fatigue and fever.  HENT: Positive for hearing loss. Negative for congestion, ear pain, nosebleeds and trouble swallowing.   Eyes: Negative for pain and visual disturbance.  Respiratory: Negative for cough, chest tightness and shortness of breath.   Cardiovascular: Negative for chest pain, palpitations and leg swelling.  Gastrointestinal: Negative for abdominal pain, blood in stool, constipation, diarrhea, nausea and vomiting.  Endocrine: Negative for polydipsia, polyphagia and polyuria.  Genitourinary: Negative for dysuria and flank pain.  Musculoskeletal: Negative for arthralgias, back pain, joint swelling, myalgias and neck stiffness.  Skin: Negative for color change, rash and wound.  Neurological: Negative for dizziness, tremors, seizures, speech difficulty, weakness, light-headedness and headaches.  Hematological: Bruises/bleeds easily.  Psychiatric/Behavioral: Negative for behavioral problems, confusion, decreased concentration, dysphoric mood and sleep disturbance. The patient is not nervous/anxious.   All other systems reviewed and are negative.   Social History   Socioeconomic History  . Marital status: Married    Spouse name: Not on file  . Number of children: 3  . Years of education: Not on file  . Highest education level: Some college, no degree  Occupational History  . Occupation: retired  Scientific laboratory technician  . Financial resource strain: Not hard at all  . Food insecurity:    Worry: Never true    Inability: Never true  . Transportation needs:    Medical: No    Non-medical: No  Tobacco Use  . Smoking status: Former Smoker    Types: Cigarettes    Last attempt to quit: 03/20/1978    Years since quitting: 40.2  .  Smokeless tobacco: Never Used  Substance and Sexual Activity  . Alcohol use: Yes    Alcohol/week:  1.0 - 2.0 standard drinks    Types: 1 - 2 Cans of beer per week  . Drug use: No  . Sexual activity: Not on file  Lifestyle  . Physical activity:    Days per week: 6 days    Minutes per session: 60 min  . Stress: Not at all  Relationships  . Social connections:    Talks on phone: Patient refused    Gets together: Patient refused    Attends religious service: Patient refused    Active member of club or organization: Patient refused    Attends meetings of clubs or organizations: Patient refused    Relationship status: Patient refused  . Intimate partner violence:    Fear of current or ex partner: Patient refused    Emotionally abused: Patient refused    Physically abused: Patient refused    Forced sexual activity: Patient refused  Other Topics Concern  . Not on file  Social History Narrative  . Not on file    Past Medical History:  Diagnosis Date  . Cancer [facility])    prostate  . Erectile dysfunction   . History of chicken pox   . History of colonic polyps   . History of measles as a child   . Hypertension   . Plantar fasciitis 10/19/2014     Patient Active Problem List   Diagnosis Date Noted  . History of prostate cancer 10/19/2014  . Hypertension 10/19/2014  . Erectile dysfunction 10/19/2014  . History of colon polyps 10/19/2014  . Plantar fasciitis 10/19/2014  . Seborrhea 10/19/2014    Past Surgical History:  Procedure Laterality Date  . CHOLECYSTECTOMY  2010   Dr. Jamal Collin  . COLONOSCOPY  04/2008  . COLONOSCOPY WITH PROPOFOL N/A 10/12/2017   Procedure: COLONOSCOPY WITH PROPOFOL;  Surgeon: Manya Silvas, MD;  Location: [facility] ENDOSCOPY;  Service: Endoscopy;  Laterality: N/A;  . ESOPHAGOGASTRODUODENOSCOPY    . HERNIA REPAIR  2010   inguinal; Dr. Jamal Collin  . stomach ulcer  1971    His family history includes Arthritis in his brother and sister; Congestive Heart Failure in his brother; Diabetes in his brother, sister, and sister; Heart attack in his sister;  Hypertension in his brother and sister.   Current Outpatient Medications:  .  amLODipine (NORVASC) 5 MG tablet, TAKE 1 TABLET (5 MG TOTAL) BY MOUTH DAILY., Disp: 90 tablet, Rfl: 4 .  aspirin EC 81 MG tablet, Take 81 mg by mouth daily., Disp: , Rfl:  .  losartan-hydrochlorothiazide (HYZAAR) 100-25 MG tablet, TAKE 1 TABLET EVERY DAY, Disp: 90 tablet, Rfl: 4 .  Multiple Vitamins-Minerals (MULTIVITAMIN ADULT PO), Take 1 tablet by mouth daily., Disp: , Rfl:  .  ofloxacin (OCUFLOX) 0.3 % ophthalmic solution, Place 1 drop into the left eye 4 (four) times daily for 7 days., Disp: 1.4 mL, Rfl: 0 .  polyethylene glycol-electrolytes (NULYTELY/GOLYTELY) 420 g solution, Take 4,000 mLs by mouth once., Disp: , Rfl:  .  tadalafil (CIALIS) 5 MG tablet, Take 10 mg by mouth as needed for erectile dysfunction (no more than 2 tablets in a day)., Disp: , Rfl:  .  montelukast (SINGULAIR) 10 MG tablet, TAKE 1 TABLET (10 MG TOTAL) BY MOUTH AT BEDTIME., Disp: 30 tablet, Rfl: 5  Patient Care Team: Birdie Sons, MD as PCP - General (Family Medicine)     Objective:    Vitals:  05/28/18 0914 05/28/18 0917 05/28/18 0939  BP: (!) 142/80 (!) 148/82 (!) 144/80  Pulse: (!) 52    Resp: 16    Temp: (!) 97.4 F (36.3 C)    TempSrc: Oral    SpO2: 96%    Weight: 183 lb (83 kg)      Physical Exam   General Appearance:    Alert, cooperative, no distress, appears stated age  Head:    Normocephalic, without obvious abnormality, atraumatic  Eyes:    PERRL, conjunctiva/corneas clear, EOM's intact, fundi    benign, both eyes       Ears:    Normal TM's and external ear canals, both ears  Nose:   Nares normal, septum midline, mucosa normal, no drainage   or sinus tenderness  Throat:   Lips, mucosa, and tongue normal; teeth and gums normal  Neck:   Supple, symmetrical, trachea midline, no adenopathy;       thyroid:  No enlargement/tenderness/nodules; no carotid   bruit or JVD  Back:     Symmetric, no curvature, ROM  normal, no CVA tenderness  Lungs:     Clear to auscultation bilaterally, respirations unlabored  Chest wall:    No tenderness or deformity  Heart:    Regular rate and rhythm, S1 and S2 normal, no murmur, rub   or gallop  Abdomen:     Soft, non-tender, bowel sounds active all four quadrants,    no masses, no organomegaly  Genitalia:    deferred  Rectal:    deferred  Extremities:   Extremities normal, atraumatic, no cyanosis or edema  Pulses:   2+ and symmetric all extremities  Skin:   Skin color, texture, turgor normal, no rashes or lesions  Lymph nodes:   Cervical, supraclavicular, and axillary nodes normal  Neurologic:   CNII-XII intact. Normal strength, sensation and reflexes      throughout    Activities of Daily Living In your present state of health, do you have any difficulty performing the following activities: 05/27/2018  Hearing? Y  Comment Does not wear hearing aids.   Vision? N  Difficulty concentrating or making decisions? N  Walking or climbing stairs? N  Dressing or bathing? N  Doing errands, shopping? N  Preparing Food and eating ? N  Using the Toilet? N  In the past six months, have you accidently leaked urine? N  Do you have problems with loss of bowel control? N  Managing your Medications? N  Managing your Finances? N  Housekeeping or managing your Housekeeping? N  Some recent data might be hidden    Fall Risk Assessment Fall Risk  05/27/2018 05/23/2017 05/15/2016 05/13/2015  Falls in the past year? 0 No No No     Depression Screen PHQ 2/9 Scores 05/27/2018 05/27/2018 05/23/2017 05/23/2017  PHQ - 2 Score 0 0 0 0  PHQ- 9 Score 0 - 0 -    6CIT Screen 05/27/2018  What Year? 0 points  What month? 0 points  What time? 0 points  Count back from 20 0 points  Months in reverse 0 points  Repeat phrase 2 points  Total Score 2       Assessment & Plan:    Annual Physical Reviewed patient's Family Medical History Reviewed and updated list of patient's medical  providers Assessment of cognitive impairment was done Assessed patient's functional ability Established a written schedule for health screening [place] Completed and Reviewed  Exercise Activities and Dietary recommendations Goals    .  DIET - REDUCE SUGAR INTAKE     Continue current diet plan of avoiding all sweets to help aid with weight loss.     . Reduce alcohol intake     Recommend decreasing amount of beers consumed a day, by half.       Immunization History  Administered Date(s) Administered  . Influenza Split 01/11/2011, 12/26/2011  . Influenza, High Dose Seasonal PF 12/23/2013, 02/04/2015, 12/18/2015, 12/21/2016, 01/10/2018  . Influenza,inj,Quad PF,6+ Mos 12/07/2012  . Pneumococcal Conjugate-13 05/02/2013  . Pneumococcal Polysaccharide-23 06/22/2005  . Td 03/20/2004  . Tdap 10/04/2011  . Zoster 10/04/2011    Health Maintenance  Topic Date Due  . Samul Dada  10/03/2021  . COLONOSCOPY  10/13/2022  . INFLUENZA VACCINE  Completed  . PNA vac Low Risk Adult  Completed     Discussed health benefits of physical activity, and encouraged him to engage in regular exercise appropriate for his age and condition.    ------------------------------------------------------------------------------------------------------------  Doing very well, exercising regularly. Normal exam. Continue current medications.    Lelon Huh, MD  [facility]

## 2018-05-29 DIAGNOSIS — Z8546 Personal history of malignant neoplasm of prostate: Secondary | ICD-10-CM | POA: Diagnosis not present

## 2018-05-29 DIAGNOSIS — I10 Essential (primary) hypertension: Secondary | ICD-10-CM | POA: Diagnosis not present

## 2018-05-30 ENCOUNTER — Telehealth: Payer: Self-pay

## 2018-05-30 LAB — RENAL FUNCTION PANEL
Albumin: 4.4 g/dL (ref 3.6–4.6)
BUN/Creatinine Ratio: 21 (ref 10–24)
BUN: 22 mg/dL (ref 8–27)
CALCIUM: 9.2 mg/dL (ref 8.6–10.2)
CHLORIDE: 98 mmol/L (ref 96–106)
CO2: 22 mmol/L (ref 20–29)
Creatinine, Ser: 1.07 mg/dL (ref 0.76–1.27)
GFR calc Af Amer: 74 mL/min/{1.73_m2} (ref >59)
GFR calc non Af Amer: 64 mL/min/{1.73_m2} (ref >59)
Glucose: 94 mg/dL (ref 65–99)
Phosphorus: 3.5 mg/dL (ref 2.8–4.1)
Potassium: 4.6 mmol/L (ref 3.5–5.2)
SODIUM: 135 mmol/L (ref 134–144)

## 2018-05-30 LAB — PSA: PROSTATE SPECIFIC AG, SERUM: 1.3 ng/mL (ref 0.0–4.0)

## 2018-05-30 NOTE — Telephone Encounter (Signed)
Pt advised.   Thanks,   -Laura  

## 2018-05-30 NOTE — Telephone Encounter (Signed)
-----   Message from Birdie Sons, MD sent at 05/30/2018 10:29 AM EDT ----- Labs good. PSA is normal. Check yearly.

## 2018-06-06 ENCOUNTER — Encounter: Payer: Self-pay | Admitting: Family Medicine

## 2018-06-06 ENCOUNTER — Ambulatory Visit (INDEPENDENT_AMBULATORY_CARE_PROVIDER_SITE_OTHER): Payer: Medicare HMO | Admitting: Family Medicine

## 2018-06-06 ENCOUNTER — Other Ambulatory Visit: Payer: Self-pay

## 2018-06-06 VITALS — BP 146/80 | HR 52 | Temp 97.9°F | Resp 18 | Wt 184.0 lb

## 2018-06-06 DIAGNOSIS — M5431 Sciatica, right side: Secondary | ICD-10-CM | POA: Diagnosis not present

## 2018-06-06 DIAGNOSIS — L03116 Cellulitis of left lower limb: Secondary | ICD-10-CM

## 2018-06-06 MED ORDER — DOXYCYCLINE HYCLATE 100 MG PO TABS: 100.0000 mg | ORAL_TABLET | Freq: Two times a day (BID) | ORAL | 0 refills | Status: DC

## 2018-06-06 MED ORDER — NAPROXEN 375 MG PO TABS: 375.0000 mg | ORAL_TABLET | Freq: Two times a day (BID) | ORAL | 0 refills | Status: AC

## 2018-06-06 NOTE — Patient Instructions (Signed)
.   Please review the attached list of medications and notify my office if there are any errors.   . Please bring all of your medications to every appointment so we can make sure that our medication list is the same as yours.   

## 2018-06-06 NOTE — Progress Notes (Signed)
Patient: Dennis Cooper Male    DOB: 12-25-1932   85 y.o.   MRN: [id number] Visit Date: 06/06/2018  Today's Provider: Lelon Huh, MD   Chief Complaint  Patient presents with  . Leg Pain   Subjective:     HPI Skin lesion of left upper leg: Patient comes in today for an evaluation of a skin lesion on his left upper thigh. He states it started out as a small bump, and now has increased in size. Patient also reports pain, redness, bleeding and swelling of the skin lesion. Area of redness has increased in size since yesterday, is sore to touch. Original lesion has been bleeding.  He also states he started having a pain right lower back extending down back of right leg over the last week and getting progressively worse, taking no OTC medications.    No Known Allergies   Current Outpatient Medications:  .  amLODipine (NORVASC) 5 MG tablet, TAKE 1 TABLET (5 MG TOTAL) BY MOUTH DAILY., Disp: 90 tablet, Rfl: 4 .  aspirin EC 81 MG tablet, Take 81 mg by mouth daily., Disp: , Rfl:  .  losartan-hydrochlorothiazide (HYZAAR) 100-25 MG tablet, TAKE 1 TABLET EVERY DAY, Disp: 90 tablet, Rfl: 4 .  Multiple Vitamins-Minerals (MULTIVITAMIN ADULT PO), Take 1 tablet by mouth daily., Disp: , Rfl:  .  polyethylene glycol-electrolytes (NULYTELY/GOLYTELY) 420 g solution, Take 4,000 mLs by mouth once., Disp: , Rfl:  .  tadalafil (CIALIS) 5 MG tablet, Take 10 mg by mouth as needed for erectile dysfunction (no more than 2 tablets in a day)., Disp: , Rfl:  .  montelukast (SINGULAIR) 10 MG tablet, TAKE 1 TABLET (10 MG TOTAL) BY MOUTH AT BEDTIME., Disp: 30 tablet, Rfl: 5  Review of Systems  Constitutional: Negative for appetite change, chills and fever.  Respiratory: Negative for chest tightness, shortness of breath and wheezing.   Cardiovascular: Negative for chest pain and palpitations.  Gastrointestinal: Negative for abdominal pain, nausea and vomiting.  Musculoskeletal: Positive for myalgias (tingling  sensation of the right upper outer quadrant of buttocks that radiates down his leg).  Skin: Positive for color change and wound.    Social History   Tobacco Use  . Smoking status: Former Smoker    Types: Cigarettes    Last attempt to quit: 03/20/1978    Years since quitting: 40.2  . Smokeless tobacco: Never Used  Substance Use Topics  . Alcohol use: Yes    Alcohol/week: 1.0 - 2.0 standard drinks    Types: 1 - 2 Cans of beer per week      Objective:   BP (!) 146/80 (BP Location: Right Arm, Cuff Size: Large)   Pulse (!) 52   Temp 97.9 F (36.6 C) (Oral)   Resp 18   Wt 184 lb (83.5 kg)   SpO2 97% Comment: room air  BMI 27.17 kg/m  Vitals:   06/06/18 1107 06/06/18 1109  BP: (!) 150/82 (!) 146/80  Pulse: (!) 52   Resp: 18   Temp: 97.9 F (36.6 C)   TempSrc: Oral   SpO2: 97%   Weight: 184 lb (83.5 kg)      Physical Exam  General appearance: alert, well developed, well nourished, cooperative and in no distress Head: Normocephalic, without obvious abnormality, atraumatic Respiratory: Respirations even and unlabored, normal respiratory rate Extremities: small scabbed lesion surrounded by about 2cm slightly tender erythema anterior aspect left upper leg.      Assessment & Plan  1. Cellulitis of left lower extremity  - doxycycline (VIBRA-TABS) 100 MG tablet; Take 1 tablet (100 mg total) by mouth 2 (two) times daily.  Dispense: 20 tablet; Refill: 0 - Aerobic culture  2. Sciatica of right side  - naproxen (NAPROSYN) 375 MG tablet; Take 1 tablet (375 mg total) by mouth 2 (two) times daily with a meal for 10 days.  Dispense: 20 tablet; Refill: 0     Lelon Huh, MD  [facility]

## 2018-06-09 LAB — AEROBIC CULTURE

## 2018-06-10 ENCOUNTER — Telehealth: Payer: Self-pay

## 2018-06-10 NOTE — Telephone Encounter (Signed)
-----   Message from Birdie Sons, MD sent at 06/09/2018  7:24 PM EDT ----- Skin culture is negative. Infection is all below the surface of the skin. Continue doxycycline. Call if not completely better when finished with antibiotic.

## 2018-06-10 NOTE — Telephone Encounter (Signed)
Patient has been advised. KW 

## 2018-08-01 DIAGNOSIS — L578 Other skin changes due to chronic exposure to nonionizing radiation: Secondary | ICD-10-CM | POA: Diagnosis not present

## 2018-08-01 DIAGNOSIS — Z1283 Encounter for screening for malignant neoplasm of skin: Secondary | ICD-10-CM | POA: Diagnosis not present

## 2018-08-01 DIAGNOSIS — D225 Melanocytic nevi of trunk: Secondary | ICD-10-CM | POA: Diagnosis not present

## 2018-08-01 DIAGNOSIS — D2230 Melanocytic nevi of unspecified part of face: Secondary | ICD-10-CM | POA: Diagnosis not present

## 2018-08-01 DIAGNOSIS — Z85828 Personal history of other malignant neoplasm of skin: Secondary | ICD-10-CM | POA: Diagnosis not present

## 2018-08-01 DIAGNOSIS — R202 Paresthesia of skin: Secondary | ICD-10-CM | POA: Diagnosis not present

## 2018-08-01 DIAGNOSIS — D1800 Hemangioma unspecified site: Secondary | ICD-10-CM | POA: Diagnosis not present

## 2018-08-01 DIAGNOSIS — L570 Actinic keratosis: Secondary | ICD-10-CM | POA: Diagnosis not present

## 2018-08-01 DIAGNOSIS — D229 Melanocytic nevi, unspecified: Secondary | ICD-10-CM | POA: Diagnosis not present

## 2018-11-15 ENCOUNTER — Telehealth: Payer: Self-pay | Admitting: Family Medicine

## 2018-11-15 NOTE — Telephone Encounter (Signed)
Pt needs a note to participate at the [facility] for exercise.  Please call when ready to pick up  [place]

## 2018-12-25 ENCOUNTER — Ambulatory Visit (INDEPENDENT_AMBULATORY_CARE_PROVIDER_SITE_OTHER): Payer: Medicare HMO

## 2018-12-25 ENCOUNTER — Other Ambulatory Visit: Payer: Self-pay

## 2018-12-25 DIAGNOSIS — Z23 Encounter for immunization: Secondary | ICD-10-CM

## 2019-01-20 ENCOUNTER — Other Ambulatory Visit: Payer: Self-pay | Admitting: Family Medicine

## 2019-01-20 MED ORDER — AMLODIPINE BESYLATE 5 MG PO TABS: 5.0000 mg | ORAL_TABLET | Freq: Every day | ORAL | 4 refills | Status: DC

## 2019-01-20 MED ORDER — LOSARTAN POTASSIUM-HCTZ 100-25 MG PO TABS: 1.0000 | ORAL_TABLET | Freq: Every day | ORAL | 4 refills | Status: DC

## 2019-01-20 NOTE — Telephone Encounter (Signed)
Last OV 06/06/2018 and last refills on 10/15/2017. Patient is requesting 90 day supply to [facility] and 30 day supply to [facility].

## 2019-01-20 NOTE — Telephone Encounter (Signed)
amLODipine (NORVASC) 5 MG tablet losartan-hydrochlorothiazide (HYZAAR) 100-25 MG tablet Pt only has 3 days left of medication.  He needs 60 days worth called into:    [place], [place] [phone] (Phone) [phone] (Fax)   He needs an emergency 30 days called into: [facility] [place], [place] [phone] (Phone) [phone] (Fax)   Please let pt's wife know this can be done at [phone].  Thanks, [employer]

## 2019-02-18 DIAGNOSIS — L821 Other seborrheic keratosis: Secondary | ICD-10-CM | POA: Diagnosis not present

## 2019-02-18 DIAGNOSIS — D2261 Melanocytic nevi of right upper limb, including shoulder: Secondary | ICD-10-CM | POA: Diagnosis not present

## 2019-02-18 DIAGNOSIS — L570 Actinic keratosis: Secondary | ICD-10-CM | POA: Diagnosis not present

## 2019-02-18 DIAGNOSIS — L820 Inflamed seborrheic keratosis: Secondary | ICD-10-CM | POA: Diagnosis not present

## 2019-02-18 DIAGNOSIS — Z85828 Personal history of other malignant neoplasm of skin: Secondary | ICD-10-CM | POA: Diagnosis not present

## 2019-02-18 DIAGNOSIS — D2262 Melanocytic nevi of left upper limb, including shoulder: Secondary | ICD-10-CM | POA: Diagnosis not present

## 2019-02-18 DIAGNOSIS — L578 Other skin changes due to chronic exposure to nonionizing radiation: Secondary | ICD-10-CM | POA: Diagnosis not present

## 2019-02-18 DIAGNOSIS — D225 Melanocytic nevi of trunk: Secondary | ICD-10-CM | POA: Diagnosis not present

## 2019-03-12 DIAGNOSIS — L720 Epidermal cyst: Secondary | ICD-10-CM | POA: Diagnosis not present

## 2019-03-12 DIAGNOSIS — L810 Postinflammatory hyperpigmentation: Secondary | ICD-10-CM | POA: Diagnosis not present

## 2019-03-12 DIAGNOSIS — L820 Inflamed seborrheic keratosis: Secondary | ICD-10-CM | POA: Diagnosis not present

## 2019-06-09 NOTE — Progress Notes (Signed)
Subjective:   Dennis Cooper is a 86 y.o. male who presents for Medicare Annual/Subsequent preventive examination.  Review of Systems:  N/A  Cardiac Risk Factors include: advanced age (>20men, >8 women);male gender;hypertension     Objective:    Vitals: BP (!) 160/72 (BP Location: Right Arm)   Pulse (!) 53   Temp 97.9 F (36.6 C) (Oral)   Ht 5\' 9"  (1.753 m)   Wt 188 lb 3.2 oz (85.4 kg)   BMI 27.79 kg/m   Body mass index is 27.79 kg/m.  Advanced Directives 06/10/2019 05/27/2018 10/12/2017 05/23/2017  Does Patient Have a Medical Advance Directive? Yes Yes Yes Yes  Type of Paramedic of [place];Living will [place];Living will [place];Living will [place];Living will  Copy of [place] in Chart? Yes - validated most recent copy scanned in chart (See row information) No - copy requested No - copy requested No - copy requested    Tobacco Social History   Tobacco Use  Smoking Status Former Smoker  . Types: Cigarettes  . Quit date: 03/20/1978  . Years since quitting: 41.2  Smokeless Tobacco Never Used     Counseling given: Not Answered   Clinical Intake:  Pre-visit preparation completed: Yes  Pain : No/denies pain Pain Score: 0-No pain     Nutritional Status: BMI 25 -29 Overweight Nutritional Risks: None Diabetes: No  How often do you need to have someone help you when you read instructions, pamphlets, or other written materials from your doctor or pharmacy?: 1 - Never  Interpreter Needed?: No  Information entered by :: [facility], LPN  Past Medical History:  Diagnosis Date  . Cancer [facility])    prostate  . Erectile dysfunction   . History of chicken pox   . History of colonic polyps   . History of measles as a child   . Hx of basal cell carcinoma 2019   R medial upper eyelid, posterior neck  . Hypertension   . Plantar fasciitis 10/19/2014  . Precancerous  lesion    Past Surgical History:  Procedure Laterality Date  . CHOLECYSTECTOMY  2010   Dr. Jamal Collin  . COLONOSCOPY  04/2008  . COLONOSCOPY WITH PROPOFOL N/A 10/12/2017   Procedure: COLONOSCOPY WITH PROPOFOL;  Surgeon: Manya Silvas, MD;  Location: [facility] ENDOSCOPY;  Service: Endoscopy;  Laterality: N/A;  . ESOPHAGOGASTRODUODENOSCOPY    . HERNIA REPAIR  2010   inguinal; Dr. Jamal Collin  . stomach ulcer  1971   Family History  Problem Relation Age of Onset  . Diabetes Sister   . Congestive Heart Failure Brother   . Diabetes Brother   . Diabetes Sister   . Heart attack Sister   . Diabetes Sister   . Arthritis Sister   . Hypertension Sister   . Diabetes Brother   . Hypertension Brother   . Arthritis Brother    Social History   Socioeconomic History  . Marital status: Married    Spouse name: Not on file  . Number of children: 3  . Years of education: Not on file  . Highest education level: Some college, no degree  Occupational History  . Occupation: retired  Tobacco Use  . Smoking status: Former Smoker    Types: Cigarettes    Quit date: 03/20/1978    Years since quitting: 41.2  . Smokeless tobacco: Never Used  Substance and Sexual Activity  . Alcohol use: Not Currently  Alcohol/week: 0.0 standard drinks  . Drug use: No  . Sexual activity: Not on file  Other Topics Concern  . Not on file  Social History Narrative  . Not on file   Social Determinants of Health   Financial Resource Strain: Low Risk   . Difficulty of Paying Living Expenses: Not hard at all  Food Insecurity: No Food Insecurity  . Worried About Charity fundraiser in the Last Year: Never true  . Ran Out of Food in the Last Year: Never true  Transportation Needs: No Transportation Needs  . Lack of Transportation (Medical): No  . Lack of Transportation (Non-Medical): No  Physical Activity: Sufficiently Active  . Days of Exercise per Week: 6 days  . Minutes of Exercise per Session: 70 min  Stress: No  Stress Concern Present  . Feeling of Stress : Not at all  Social Connections: Somewhat Isolated  . Frequency of Communication with Friends and Family: More than three times a week  . Frequency of Social Gatherings with Friends and Family: More than three times a week  . Attends Religious Services: Never  . Active Member of Clubs or Organizations: No  . Attends Archivist Meetings: Never  . Marital Status: Married    Outpatient Encounter Medications as of 06/10/2019  Medication Sig  . amLODipine (NORVASC) 5 MG tablet Take 1 tablet (5 mg total) by mouth daily.  Marland Kitchen aspirin EC 81 MG tablet Take 81 mg by mouth daily.  Marland Kitchen losartan-hydrochlorothiazide (HYZAAR) 100-25 MG tablet Take 1 tablet by mouth daily.  . Multiple Vitamins-Minerals (MULTIVITAMIN ADULT PO) Take 1 tablet by mouth daily.  . montelukast (SINGULAIR) 10 MG tablet TAKE 1 TABLET (10 MG TOTAL) BY MOUTH AT BEDTIME. (Patient not taking: Reported on 06/10/2019)  . polyethylene glycol-electrolytes (NULYTELY/GOLYTELY) 420 g solution Take 4,000 mLs by mouth once.  . tadalafil (CIALIS) 5 MG tablet Take 10 mg by mouth as needed for erectile dysfunction (no more than 2 tablets in a day).  . [DISCONTINUED] doxycycline (VIBRA-TABS) 100 MG tablet Take 1 tablet (100 mg total) by mouth 2 (two) times daily. (Patient not taking: Reported on 06/10/2019)   No facility-administered encounter medications on file as of 06/10/2019.    Activities of Daily Living In your present state of health, do you have any difficulty performing the following activities: 06/10/2019  Hearing? Y  Comment Wears bilateral hearing aids.  Vision? N  Difficulty concentrating or making decisions? N  Walking or climbing stairs? N  Dressing or bathing? N  Doing errands, shopping? N  Preparing Food and eating ? N  Using the Toilet? N  In the past six months, have you accidently leaked urine? N  Do you have problems with loss of bowel control? N  Managing your  Medications? N  Managing your Finances? N  Housekeeping or managing your Housekeeping? N  Some recent data might be hidden    Patient Care Team: Birdie Sons, MD as PCP - General (Family Medicine) Ralene Bathe, MD (Dermatology) [place], [facility])   Assessment:   This is a routine wellness examination for [facility].  Exercise Activities and Dietary recommendations Current Exercise Habits: Structured exercise class;Home exercise routine, Type of exercise: treadmill;walking;stretching;strength training/weights, Time (Minutes): > 60, Frequency (Times/Week): 6, Weekly Exercise (Minutes/Week): 0, Intensity: Mild, Exercise limited by: None identified  Goals    . DIET - EAT MORE FRUITS AND VEGETABLES     Recommend increasing amount of fruits in daily diet to  two servings a day.        Fall Risk: Fall Risk  06/10/2019 05/27/2018 05/23/2017 05/15/2016 05/13/2015  Falls in the past year? 0 0 No No No  Number falls in past yr: 0 - - - -  Injury with Fall? 0 - - - -    FALL RISK PREVENTION PERTAINING TO THE HOME:  Any stairs in or around the home? No  If so, are there any without handrails? N/A  Home free of loose throw rugs in walkways, pet beds, electrical cords, etc? Yes  Adequate lighting in your home to reduce risk of falls? Yes   ASSISTIVE DEVICES UTILIZED TO PREVENT FALLS:  Life alert? No  Use of a cane, walker or w/c? No  Grab bars in the bathroom? No  Shower chair or bench in shower? Yes  Elevated toilet seat or a handicapped toilet? No   TIMED UP AND GO:  Was the test performed? No .    Depression Screen PHQ 2/9 Scores 06/10/2019 05/27/2018 05/27/2018 05/23/2017  PHQ - 2 Score 0 0 0 0  PHQ- 9 Score - 0 - 0    Cognitive Function     6CIT Screen 06/10/2019 05/27/2018 05/23/2017  What Year? 0 points 0 points 0 points  What month? 0 points 0 points 0 points  What time? 0 points 0 points 0 points  Count back from 20 0 points 0 points 0 points  Months in  reverse 2 points 0 points 0 points  Repeat phrase 2 points 2 points 2 points  Total Score 4 2 2     Immunization History  Administered Date(s) Administered  . Fluad Quad(high Dose 65+) 12/25/2018  . Influenza Split 01/11/2011, 12/26/2011  . Influenza, High Dose Seasonal PF 12/23/2013, 02/04/2015, 12/18/2015, 12/21/2016, 01/10/2018  . Influenza,inj,Quad PF,6+ Mos 12/07/2012  . PFIZER SARS-COV-2 Vaccination 03/25/2019, 04/15/2019  . Pneumococcal Conjugate-13 05/02/2013  . Pneumococcal Polysaccharide-23 06/22/2005  . Td 03/20/2004  . Tdap 10/04/2011  . Zoster 10/04/2011    Qualifies for Shingles Vaccine? Yes  Zostavax completed 10/04/11. Due for Shingrix. Pt has been advised to call insurance company to determine out of pocket expense. Advised may also receive vaccine at local pharmacy or Health Dept. Verbalized acceptance and understanding.  Tdap: Up to date  Flu Vaccine: Up to date  Pneumococcal Vaccine: Completed series  Screening Tests Health Maintenance  Topic Date Due  . TETANUS/TDAP  10/03/2021  . COLONOSCOPY  10/13/2022  . INFLUENZA VACCINE  Completed  . PNA vac Low Risk Adult  Completed   Cancer Screenings:  Colorectal Screening: Completed 10/12/17. Repeat every 5 years.   Lung Cancer Screening: (Low Dose CT Chest recommended if Age 80-80 years, 30 pack-year currently smoking OR have quit w/in 15years.) does not qualify.   Additional Screening:  Vision Screening: Recommended annual ophthalmology exams for early detection of glaucoma and other disorders of the eye.  Dental Screening: Recommended annual dental exams for proper oral hygiene  Community Resource Referral:  CRR required this visit?  No        Plan:  I have personally reviewed and addressed the Medicare Annual Wellness questionnaire and have noted the following in the patient's chart:  A. Medical and social history B. Use of alcohol, tobacco or illicit drugs  C. Current medications and  supplements D. Functional ability and status E.  Nutritional status F.  Physical activity G. Advance directives H. List of other physicians I.  Hospitalizations, surgeries, and ER visits in previous 12 months  [place] such as hearing and vision if needed, cognitive and depression L. Referrals and appointments   In addition, I have reviewed and discussed with patient certain preventive protocols, quality metrics, and best practice recommendations. A written personalized care plan for preventive services as well as general preventive health recommendations were provided to patient.   Glendora Score, [place]  [id number] Nurse Health Advisor   Nurse Notes: None.

## 2019-06-10 ENCOUNTER — Encounter: Payer: Self-pay | Admitting: Family Medicine

## 2019-06-10 ENCOUNTER — Other Ambulatory Visit: Payer: Self-pay

## 2019-06-10 ENCOUNTER — Ambulatory Visit (INDEPENDENT_AMBULATORY_CARE_PROVIDER_SITE_OTHER): Payer: Medicare HMO

## 2019-06-10 ENCOUNTER — Ambulatory Visit (INDEPENDENT_AMBULATORY_CARE_PROVIDER_SITE_OTHER): Payer: Medicare HMO | Admitting: Family Medicine

## 2019-06-10 VITALS — BP 160/72 | HR 53 | Temp 97.9°F | Ht 69.0 in | Wt 188.2 lb

## 2019-06-10 VITALS — BP 154/76 | HR 53 | Temp 97.9°F | Ht 69.0 in | Wt 188.2 lb

## 2019-06-10 DIAGNOSIS — Z136 Encounter for screening for cardiovascular disorders: Secondary | ICD-10-CM | POA: Diagnosis not present

## 2019-06-10 DIAGNOSIS — Z0000 Encounter for general adult medical examination without abnormal findings: Secondary | ICD-10-CM | POA: Diagnosis not present

## 2019-06-10 DIAGNOSIS — I10 Essential (primary) hypertension: Secondary | ICD-10-CM | POA: Diagnosis not present

## 2019-06-10 DIAGNOSIS — Z125 Encounter for screening for malignant neoplasm of prostate: Secondary | ICD-10-CM | POA: Diagnosis not present

## 2019-06-10 MED ORDER — AMLODIPINE BESYLATE 10 MG PO TABS: 10.0000 mg | ORAL_TABLET | Freq: Every day | ORAL | 3 refills | Status: DC

## 2019-06-10 NOTE — Patient Instructions (Signed)
.   Please review the attached list of medications and notify my office if there are any errors.   . Please bring all of your medications to every appointment so we can make sure that our medication list is the same as yours.   

## 2019-06-10 NOTE — Progress Notes (Signed)
Patient: Dennis Cooper, Male    DOB: 1932-06-06, 86 y.o.   MRN: [id number] Visit Date: 06/10/2019  Today's Provider: Lelon Huh, MD   Chief Complaint  Patient presents with  . Annual Exam  . Hypertension   Subjective:     Annual physical exam Dennis Cooper is a 86 y.o. male who presents today for health maintenance and complete physical. He feels well. He reports exercising 5-6 days per week. He reports he is sleeping fairly well.  -----------------------------------------------------------------  Hypertension, follow-up:     BP Readings from Last 3 Encounters:  05/28/18 (!) 148/82  05/27/18 122/66  05/24/18 (!) 156/73    He was last seen for hypertension 1 years ago.  BP at that visit was 148/82. Management since that visit includes no changes. He reports good compliance with treatment. He is not having side effects.  He is exercising. He is not adherent to low salt diet.   Outside blood pressures are being checked at home. He is experiencing none.  Patient denies chest pain, chest pressure/discomfort, claudication, dyspnea, exertional chest pressure/discomfort, fatigue, irregular heart beat, lower extremity edema, near-syncope, orthopnea, palpitations, paroxysmal nocturnal dyspnea, syncope and tachypnea.   Cardiovascular risk factors include advanced age (older than 17 for men, 46 for women), hypertension and male gender.  Use of agents associated with hypertension: NSAIDS.                Weight trend:  Wt Readings from Last 3 Encounters:  05/28/18 183 lb (83 kg)  05/27/18 181 lb 3.2 oz (82.2 kg)  05/24/18 183 lb 3.2 oz (83.1 kg)    Current diet: in general, a "healthy" diet    ------------------------------------------------------------------------  Follow up for History of Prostate Cancer:  The patient was last seen for this 1 year ago. Changes made at last visit include none.  He reports good compliance with treatment. He feels  that condition is stable He is not having side effects.     Review of Systems  Constitutional: Negative.   HENT: Negative.   Eyes: Negative.   Respiratory: Negative.   Cardiovascular: Negative.   Gastrointestinal: Negative.   Endocrine: Negative.   Genitourinary: Negative.   Musculoskeletal: Negative.   Skin: Negative.   Allergic/Immunologic: Negative.   Neurological: Negative.   Hematological: Negative.   Psychiatric/Behavioral: Negative.     Social History      He  reports that he quit smoking about 41 years ago. His smoking use included cigarettes. He has never used smokeless tobacco. He reports previous alcohol use. He reports that he does not use drugs.       Social History   Socioeconomic History  . Marital status: Married    Spouse name: Not on file  . Number of children: 3  . Years of education: Not on file  . Highest education level: Some college, no degree  Occupational History  . Occupation: retired  Tobacco Use  . Smoking status: Former Smoker    Types: Cigarettes    Quit date: 03/20/1978    Years since quitting: 41.2  . Smokeless tobacco: Never Used  Substance and Sexual Activity  . Alcohol use: Not Currently    Alcohol/week: 0.0 standard drinks  . Drug use: No  . Sexual activity: Not on file  Other Topics Concern  . Not on file  Social History Narrative  . Not on file   Social Determinants of Health   Financial Resource Strain: Low Risk   .  Difficulty of Paying Living Expenses: Not hard at all  Food Insecurity: No Food Insecurity  . Worried About Charity fundraiser in the Last Year: Never true  . Ran Out of Food in the Last Year: Never true  Transportation Needs: No Transportation Needs  . Lack of Transportation (Medical): No  . Lack of Transportation (Non-Medical): No  Physical Activity: Sufficiently Active  . Days of Exercise per Week: 6 days  . Minutes of Exercise per Session: 70 min  Stress: No Stress Concern Present  . Feeling of  Stress : Not at all  Social Connections: Somewhat Isolated  . Frequency of Communication with Friends and Family: More than three times a week  . Frequency of Social Gatherings with Friends and Family: More than three times a week  . Attends Religious Services: Never  . Active Member of Clubs or Organizations: No  . Attends Archivist Meetings: Never  . Marital Status: Married    Past Medical History:  Diagnosis Date  . Cancer [facility])    prostate  . Erectile dysfunction   . History of chicken pox   . History of colonic polyps   . History of measles as a child   . Hx of basal cell carcinoma 2019   R medial upper eyelid, posterior neck  . Hypertension   . Plantar fasciitis 10/19/2014  . Precancerous lesion      Patient Active Problem List   Diagnosis Date Noted  . History of prostate cancer 10/19/2014  . Hypertension 10/19/2014  . Erectile dysfunction 10/19/2014  . History of colon polyps 10/19/2014  . Plantar fasciitis 10/19/2014  . Seborrhea 10/19/2014    Past Surgical History:  Procedure Laterality Date  . CHOLECYSTECTOMY  2010   Dr. Jamal Collin  . COLONOSCOPY  04/2008  . COLONOSCOPY WITH PROPOFOL N/A 10/12/2017   Procedure: COLONOSCOPY WITH PROPOFOL;  Surgeon: Manya Silvas, MD;  Location: [facility] ENDOSCOPY;  Service: Endoscopy;  Laterality: N/A;  . ESOPHAGOGASTRODUODENOSCOPY    . HERNIA REPAIR  2010   inguinal; Dr. Jamal Collin  . stomach ulcer  1971    Family History        Family Status  Relation Name Status  . Mother  Deceased at age 69       natural causes  . Father  Deceased at age 9       MI  . Sister  Deceased at age 49       Fire accident  . Brother  Deceased at age 45  . Sister  Deceased  . Sister  Alive  . Brother  Alive       had a quad-bypass        His family history includes Arthritis in his brother and sister; Congestive Heart Failure in his brother; Diabetes in his brother, brother, sister, sister, and sister; Heart attack in his  sister; Hypertension in his brother and sister.      No Known Allergies   Current Outpatient Medications:  .  amLODipine (NORVASC) 5 MG tablet, Take 1 tablet (5 mg total) by mouth daily., Disp: 90 tablet, Rfl: 4 .  aspirin EC 81 MG tablet, Take 81 mg by mouth daily., Disp: , Rfl:  .  losartan-hydrochlorothiazide (HYZAAR) 100-25 MG tablet, Take 1 tablet by mouth daily., Disp: 90 tablet, Rfl: 4 .  Multiple Vitamins-Minerals (MULTIVITAMIN ADULT PO), Take 1 tablet by mouth daily., Disp: , Rfl:  .  polyethylene glycol-electrolytes (NULYTELY/GOLYTELY) 420 g solution, Take 4,000 mLs by mouth  once., Disp: , Rfl:  .  tadalafil (CIALIS) 5 MG tablet, Take 10 mg by mouth as needed for erectile dysfunction (no more than 2 tablets in a day)., Disp: , Rfl:  .  montelukast (SINGULAIR) 10 MG tablet, TAKE 1 TABLET (10 MG TOTAL) BY MOUTH AT BEDTIME. (Patient not taking: Reported on 06/10/2019), Disp: 30 tablet, Rfl: 5   Patient Care Team: Birdie Sons, MD as PCP - General (Family Medicine) Ralene Bathe, MD (Dermatology) [place], [place] (Optometry)    Objective:    Vitals: BP (!) 154/76 (BP Location: Right Arm, Patient Position: Sitting, Cuff Size: Normal)   Pulse (!) 53   Temp 97.9 F (36.6 C) (Oral)   Ht 5\' 9"  (1.753 m)   Wt 188 lb 3.2 oz (85.4 kg)   BMI 27.79 kg/m    Vitals:   06/10/19 0949  BP: (!) 154/76  Pulse: (!) 53  Temp: 97.9 F (36.6 C)  TempSrc: Oral  Weight: 188 lb 3.2 oz (85.4 kg)  Height: 5\' 9"  (1.753 m)     Physical Exam   General: Appearance:     Overweight male in no acute distress  Eyes:    PERRL, conjunctiva/corneas clear, EOM's intact       Lungs:     Clear to auscultation bilaterally, respirations unlabored  Heart:    Bradycardic. Normal rhythm. No murmurs, rubs, or gallops.   MS:   All extremities are intact.   Neurologic:   Awake, alert, oriented x 3. No apparent focal neurological           defect.        Depression Screen PHQ 2/9 Scores  06/10/2019 05/27/2018 05/27/2018 05/23/2017  PHQ - 2 Score 0 0 0 0  PHQ- 9 Score - 0 - 0       Assessment & Plan:     Routine Health Maintenance and Physical Exam  Exercise Activities and Dietary recommendations Goals    . DIET - EAT MORE FRUITS AND VEGETABLES     Recommend increasing amount of fruits in daily diet to two servings a day.        Immunization History  Administered Date(s) Administered  . Fluad Quad(high Dose 65+) 12/25/2018  . Influenza Split 01/11/2011, 12/26/2011  . Influenza, High Dose Seasonal PF 12/23/2013, 02/04/2015, 12/18/2015, 12/21/2016, 01/10/2018  . Influenza,inj,Quad PF,6+ Mos 12/07/2012  . PFIZER SARS-COV-2 Vaccination 03/25/2019, 04/15/2019  . Pneumococcal Conjugate-13 05/02/2013  . Pneumococcal Polysaccharide-23 06/22/2005  . Td 03/20/2004  . Tdap 10/04/2011  . Zoster 10/04/2011    Health Maintenance  Topic Date Due  . Samul Dada  10/03/2021  . COLONOSCOPY  10/13/2022  . INFLUENZA VACCINE  Completed  . PNA vac Low Risk Adult  Completed     Discussed health benefits of physical activity, and encouraged him to engage in regular exercise appropriate for his age and condition.    1. Essential hypertension Not to goal, will double dose of amlodipine.  - Comprehensive metabolic panel - Lipid panel - amLODipine (NORVASC) 10 MG tablet; Take 1 tablet (10 mg total) by mouth daily.  Dispense: 90 tablet; Refill: 3 - EKG 12-Lead  2. Encounter for special screening examination for cardiovascular disorder  - Lipid panel  3. Prostate cancer screening  - PSA  4. Annual physical exam  The entirety of the information documented in the History of Present Illness, Review of Systems and Physical Exam were personally obtained by me. Portions of this information were initially documented by Roderic Ovens  Waldo Laine, CMA and reviewed by me for thoroughness and accuracy.      Lelon Huh, MD  [facility]

## 2019-06-10 NOTE — Patient Instructions (Signed)
Dennis Cooper , Thank you for taking time to come for your Medicare Wellness Visit. I appreciate your ongoing commitment to your health goals. Please review the following plan we discussed and let me know if I can assist you in the future.   Screening recommendations/referrals: Colonoscopy: Up to date, due 09/2022 Recommended yearly ophthalmology/optometry visit for glaucoma screening and checkup Recommended yearly dental visit for hygiene and checkup  Vaccinations: Influenza vaccine: Up to date Pneumococcal vaccine: Completed series Tdap vaccine: Up to date, due 09/2021 Shingles vaccine: Pt declines today.     Advanced directives: Currently on file  Conditions/risks identified: Recommend increasing amount of fruits in daily diet to two servings a day.   Next appointment: 10:00 AM today with Dr Caryn Section   Preventive Care 51 Years and Older, Male Preventive care refers to lifestyle choices and visits with your health care provider that can promote health and wellness. What does preventive care include?  A yearly physical exam. This is also called an annual well check.  Dental exams once or twice a year.  Routine eye exams. Ask your health care provider how often you should have your eyes checked.  Personal lifestyle choices, including:  Daily care of your teeth and gums.  Regular physical activity.  Eating a healthy diet.  Avoiding tobacco and drug use.  Limiting alcohol use.  Practicing safe sex.  Taking low doses of aspirin every day.  Taking vitamin and mineral supplements as recommended by your health care provider. What happens during an annual well check? The services and screenings done by your health care provider during your annual well check will depend on your age, overall health, lifestyle risk factors, and family history of disease. Counseling  Your health care provider may ask you questions about your:  Alcohol use.  Tobacco use.  Drug use.  Emotional  well-being.  Home and relationship well-being.  Sexual activity.  Eating habits.  History of falls.  Memory and ability to understand (cognition).  Work and work Statistician. Screening  You may have the following tests or measurements:  Height, weight, and BMI.  Blood pressure.  Lipid and cholesterol levels. These may be checked every 5 years, or more frequently if you are over 45 years old.  Skin check.  Lung cancer screening. You may have this screening every year starting at age 62 if you have a 30-pack-year history of smoking and currently smoke or have quit within the past 15 years.  Fecal occult blood test (FOBT) of the stool. You may have this test every year starting at age 53.  Flexible sigmoidoscopy or colonoscopy. You may have a sigmoidoscopy every 5 years or a colonoscopy every 10 years starting at age 28.  Prostate cancer screening. Recommendations will vary depending on your family history and other risks.  Hepatitis C blood test.  Hepatitis B blood test.  Sexually transmitted disease (STD) testing.  Diabetes screening. This is done by checking your blood sugar (glucose) after you have not eaten for a while (fasting). You may have this done every 1-3 years.  Abdominal aortic aneurysm (AAA) screening. You may need this if you are a current or former smoker.  Osteoporosis. You may be screened starting at age 60 if you are at high risk. Talk with your health care provider about your test results, treatment options, and if necessary, the need for more tests. Vaccines  Your health care provider may recommend certain vaccines, such as:  Influenza vaccine. This is recommended every year.  Tetanus, diphtheria, and acellular pertussis (Tdap, Td) vaccine. You may need a Td booster every 10 years.  Zoster vaccine. You may need this after age 52.  Pneumococcal 13-valent conjugate (PCV13) vaccine. One dose is recommended after age 26.  Pneumococcal  polysaccharide (PPSV23) vaccine. One dose is recommended after age 84. Talk to your health care provider about which screenings and vaccines you need and how often you need them. This information is not intended to replace advice given to you by your health care provider. Make sure you discuss any questions you have with your health care provider. Document Released: 04/02/2015 Document Revised: 11/24/2015 Document Reviewed: 01/05/2015 Elsevier Interactive Patient Education  2017 [place] Prevention in the Home Falls can cause injuries. They can happen to people of all ages. There are many things you can do to make your home safe and to help prevent falls. What can I do on the outside of my home?  Regularly fix the edges of walkways and driveways and fix any cracks.  Remove anything that might make you trip as you walk through a door, such as a raised step or threshold.  Trim any bushes or trees on the path to your home.  Use bright outdoor lighting.  Clear any walking paths of anything that might make someone trip, such as rocks or tools.  Regularly check to see if handrails are loose or broken. Make sure that both sides of any steps have handrails.  Any raised decks and porches should have guardrails on the edges.  Have any leaves, snow, or ice cleared regularly.  Use sand or salt on walking paths during winter.  Clean up any spills in your garage right away. This includes oil or grease spills. What can I do in the bathroom?  Use night lights.  Install grab bars by the toilet and in the tub and shower. Do not use towel bars as grab bars.  Use non-skid mats or decals in the tub or shower.  If you need to sit down in the shower, use a plastic, non-slip stool.  Keep the floor dry. Clean up any water that spills on the floor as soon as it happens.  Remove soap buildup in the tub or shower regularly.  Attach bath mats securely with double-sided non-slip rug  tape.  Do not have throw rugs and other things on the floor that can make you trip. What can I do in the bedroom?  Use night lights.  Make sure that you have a light by your bed that is easy to reach.  Do not use any sheets or blankets that are too big for your bed. They should not hang down onto the floor.  Have a firm chair that has side arms. You can use this for support while you get dressed.  Do not have throw rugs and other things on the floor that can make you trip. What can I do in the kitchen?  Clean up any spills right away.  Avoid walking on wet floors.  Keep items that you use a lot in easy-to-reach places.  If you need to reach something above you, use a strong step stool that has a grab bar.  Keep electrical cords out of the way.  Do not use floor polish or wax that makes floors slippery. If you must use wax, use non-skid floor wax.  Do not have throw rugs and other things on the floor that can make you trip. What can I do  with my stairs?  Do not leave any items on the stairs.  Make sure that there are handrails on both sides of the stairs and use them. Fix handrails that are broken or loose. Make sure that handrails are as long as the stairways.  Check any carpeting to make sure that it is firmly attached to the stairs. Fix any carpet that is loose or worn.  Avoid having throw rugs at the top or bottom of the stairs. If you do have throw rugs, attach them to the floor with carpet tape.  Make sure that you have a light switch at the top of the stairs and the bottom of the stairs. If you do not have them, ask someone to add them for you. What else can I do to help prevent falls?  Wear shoes that:  Do not have high heels.  Have rubber bottoms.  Are comfortable and fit you well.  Are closed at the toe. Do not wear sandals.  If you use a stepladder:  Make sure that it is fully opened. Do not climb a closed stepladder.  Make sure that both sides of the  stepladder are locked into place.  Ask someone to hold it for you, if possible.  Clearly mark and make sure that you can see:  Any grab bars or handrails.  First and last steps.  Where the edge of each step is.  Use tools that help you move around (mobility aids) if they are needed. These include:  Canes.  Walkers.  Scooters.  Crutches.  Turn on the lights when you go into a dark area. Replace any light bulbs as soon as they burn out.  Set up your furniture so you have a clear path. Avoid moving your furniture around.  If any of your floors are uneven, fix them.  If there are any pets around you, be aware of where they are.  Review your medicines with your doctor. Some medicines can make you feel dizzy. This can increase your chance of falling. Ask your doctor what other things that you can do to help prevent falls. This information is not intended to replace advice given to you by your health care provider. Make sure you discuss any questions you have with your health care provider. Document Released: 12/31/2008 Document Revised: 08/12/2015 Document Reviewed: 04/10/2014 Elsevier Interactive Patient Education  2017 [employer].

## 2019-06-11 LAB — COMPREHENSIVE METABOLIC PANEL
ALT: 25 IU/L (ref 0–44)
AST: 29 IU/L (ref 0–40)
Albumin/Globulin Ratio: 2.2 (ref 1.2–2.2)
Albumin: 4.3 g/dL (ref 3.6–4.6)
Alkaline Phosphatase: 58 IU/L (ref 39–117)
BUN/Creatinine Ratio: 16 (ref 10–24)
BUN: 15 mg/dL (ref 8–27)
Bilirubin Total: 0.9 mg/dL (ref 0.0–1.2)
CO2: 22 mmol/L (ref 20–29)
Calcium: 9 mg/dL (ref 8.6–10.2)
Chloride: 101 mmol/L (ref 96–106)
Creatinine, Ser: 0.92 mg/dL (ref 0.76–1.27)
GFR calc Af Amer: 88 mL/min/{1.73_m2} (ref >59)
GFR calc non Af Amer: 76 mL/min/{1.73_m2} (ref >59)
Globulin, Total: 2 g/dL (ref 1.5–4.5)
Glucose: 88 mg/dL (ref 65–99)
Potassium: 4.4 mmol/L (ref 3.5–5.2)
Sodium: 138 mmol/L (ref 134–144)
Total Protein: 6.3 g/dL (ref 6.0–8.5)

## 2019-06-11 LAB — LIPID PANEL
Chol/HDL Ratio: 2 ratio (ref 0.0–5.0)
Cholesterol, Total: 114 mg/dL (ref 100–199)
HDL: 56 mg/dL (ref >39)
LDL Chol Calc (NIH): 45 mg/dL (ref 0–99)
Triglycerides: 60 mg/dL (ref 0–149)
VLDL Cholesterol Cal: 13 mg/dL (ref 5–40)

## 2019-06-11 LAB — PSA: Prostate Specific Ag, Serum: 1.2 ng/mL (ref 0.0–4.0)

## 2019-09-08 ENCOUNTER — Ambulatory Visit: Payer: Medicare HMO | Admitting: Dermatology

## 2019-09-08 ENCOUNTER — Other Ambulatory Visit: Payer: Self-pay

## 2019-09-08 DIAGNOSIS — L578 Other skin changes due to chronic exposure to nonionizing radiation: Secondary | ICD-10-CM

## 2019-09-08 DIAGNOSIS — D1723 Benign lipomatous neoplasm of skin and subcutaneous tissue of right leg: Secondary | ICD-10-CM | POA: Diagnosis not present

## 2019-09-08 DIAGNOSIS — L219 Seborrheic dermatitis, unspecified: Secondary | ICD-10-CM

## 2019-09-08 DIAGNOSIS — Z85828 Personal history of other malignant neoplasm of skin: Secondary | ICD-10-CM

## 2019-09-08 DIAGNOSIS — D229 Melanocytic nevi, unspecified: Secondary | ICD-10-CM | POA: Diagnosis not present

## 2019-09-08 DIAGNOSIS — L570 Actinic keratosis: Secondary | ICD-10-CM | POA: Diagnosis not present

## 2019-09-08 MED ORDER — KETOCONAZOLE 2 % EX SHAM: 1.0000 "application " | MEDICATED_SHAMPOO | Freq: Once | CUTANEOUS | 3 refills | Status: AC

## 2019-09-08 NOTE — Progress Notes (Signed)
   Follow-Up Visit   Subjective  Dennis Cooper is a 86 y.o. male who presents for the following: Follow-up.  Patient presents today for follow up on AK's treated with Cryotherapy at last OV on 02/18/19, Also has PDT treatment. Patient has a h/o BCC 12/27/17 on Post. Neck and 09/06/17 Mid Upper Eyelid  The following portions of the chart were reviewed this encounter and updated as appropriate:  Tobacco  Allergies  Meds  Problems  Med Hx  Surg Hx  Fam Hx      Review of Systems:  No other skin or systemic complaints except as noted in HPI or Assessment and Plan.  Objective  Well appearing patient in no apparent distress; mood and affect are within normal limits.  A focused examination was performed including Forehead, arms and legs. Relevant physical exam findings are noted in the Assessment and Plan.  Objective  Head and Ears x 5 (5): Erythematous thin papules/macules with gritty scale.   Objective  Scalp: Mild Scale today  Objective  Neck - Posterior, Right Upper Eyelid: Well healed scar with no evidence of recurrence.   Objective  Right Knee - Anterior: 1.5 cm Rubbery Sub cutaneous nodule   Assessment & Plan    AK (actinic keratosis) (5) Head and Ears x 5  Cryotherapy today Prior to procedure, discussed risks of blister formation, small wound, skin dyspigmentation, or rare scar following cryotherapy.    Destruction of lesion - Head and Ears x 5 Complexity: simple   Destruction method: cryotherapy   Informed consent: discussed and consent obtained   Timeout:  patient name, date of birth, surgical site, and procedure verified Lesion destroyed using liquid nitrogen: Yes   Region frozen until ice ball extended beyond lesion: Yes   Outcome: patient tolerated procedure well with no complications   Post-procedure details: wound care instructions given    Seborrheic dermatitis Scalp  Start Ketoconazole 2% shampoo up to 3 times a week and leave in for several  mins. Shampoo with Head and shoulders on all other days.   ketoconazole (NIZORAL) 2 % shampoo - Scalp  History of basal cell carcinoma (BCC) (2) Neck - Posterior; Right Upper Eyelid  Clear. Observe for recurrence. Call clinic for new or changing lesions.  Recommend regular skin exams, daily broad-spectrum spf 30+ sunscreen use, and photoprotection.     Lipoma of right lower extremity Right Knee - Anterior  Possible Lipoma  The patient will observe these symptoms, and report promptly any worsening or unexpected persistence.  If well, may return prn.    Actinic Damage - diffuse scaly erythematous macules with underlying dyspigmentation - Recommend daily broad spectrum sunscreen SPF 30+ to sun-exposed areas, reapply every 2 hours as needed.  - Call for new or changing lesions.  Melanocytic Nevi - Tan-brown and/or pink-flesh-colored symmetric macules and papules - Benign appearing on exam today - Observation - Call clinic for new or changing moles - Recommend daily use of broad spectrum spf 30+ sunscreen to sun-exposed areas.    Return in about 6 months (around 03/09/2020) for TBSE, AK Follow up.  Marene Lenz, CMA, am acting as scribe for Sarina Ser, MD .  Documentation: I have reviewed the above documentation for accuracy and completeness, and I agree with the above.  Sarina Ser, MD

## 2019-09-09 ENCOUNTER — Encounter: Payer: Self-pay | Admitting: Dermatology

## 2019-09-15 NOTE — Progress Notes (Signed)
Established patient visit   Patient: Dennis Cooper   DOB: Mar 29, 1932   86 y.o. Male  MRN: [id number] Visit Date: 09/16/2019  Today's healthcare provider: Lelon Huh, MD   Chief Complaint  Patient presents with  . Hypertension   I,Latasha Walston,acting as a scribe for Lelon Huh, MD.,have documented all relevant documentation on the behalf of Lelon Huh, MD,as directed by  Lelon Huh, MD while in the presence of Lelon Huh, MD.  Subjective    HPI Hypertension, follow-up  BP Readings from Last 3 Encounters:  09/16/19 132/65  06/10/19 (!) 154/76  06/10/19 (!) 160/72   Wt Readings from Last 3 Encounters:  09/16/19 195 lb (88.5 kg)  06/10/19 188 lb 3.2 oz (85.4 kg)  06/10/19 188 lb 3.2 oz (85.4 kg)     He was last seen for hypertension 3 months ago.  BP at that visit was 154/76. Management since that visit includes increasing Amlodipine from 5mg  to 10mg  daily.  He reports good compliance with treatment. He is not having side effects.  He is following a Regular diet. He is exercising. He does not smoke.  Use of agents associated with hypertension: NSAIDS.   Outside blood pressures are not being checked at home. Symptoms: No chest pain No chest pressure  No palpitations No syncope  No dyspnea No orthopnea  No paroxysmal nocturnal dyspnea No lower extremity edema   Pertinent labs: Lab Results  Component Value Date   CHOL 114 06/10/2019   HDL 56 06/10/2019   LDLCALC 45 06/10/2019   TRIG 60 06/10/2019   CHOLHDL 2.0 06/10/2019   Lab Results  Component Value Date   NA 138 06/10/2019   K 4.4 06/10/2019   CREATININE 0.92 06/10/2019   GFRNONAA 76 06/10/2019   GFRAA 88 06/10/2019   GLUCOSE 88 06/10/2019     The ASCVD Risk score (Goff DC Jr., et al., 2013) failed to calculate for the following reasons:   The 2013 ASCVD risk score is only valid for ages 16 to 54    ---------------------------------------------------------------------------------------------------     Medications: Outpatient Medications Prior to Visit  Medication Sig  . amLODipine (NORVASC) 10 MG tablet Take 1 tablet (10 mg total) by mouth daily.  Marland Kitchen aspirin EC 81 MG tablet Take 81 mg by mouth daily.  Marland Kitchen losartan-hydrochlorothiazide (HYZAAR) 100-25 MG tablet Take 1 tablet by mouth daily.  . Multiple Vitamins-Minerals (MULTIVITAMIN ADULT PO) Take 1 tablet by mouth daily.  . polyethylene glycol-electrolytes (NULYTELY/GOLYTELY) 420 g solution Take 4,000 mLs by mouth once.  . tadalafil (CIALIS) 5 MG tablet Take 10 mg by mouth as needed for erectile dysfunction (no more than 2 tablets in a day).  . [DISCONTINUED] montelukast (SINGULAIR) 10 MG tablet TAKE 1 TABLET (10 MG TOTAL) BY MOUTH AT BEDTIME. (Patient not taking: Reported on 06/10/2019)   No facility-administered medications prior to visit.    Review of Systems  Constitutional: Negative for appetite change, chills and fever.  Respiratory: Negative for chest tightness, shortness of breath and wheezing.   Cardiovascular: Negative for chest pain and palpitations.  Gastrointestinal: Negative for abdominal pain, nausea and vomiting.      Objective    BP 132/65 (BP Location: Right Arm, Patient Position: Sitting, Cuff Size: Large)   Pulse (!) 57   Temp (!) 96.9 F (36.1 C) (Temporal)   Wt 195 lb (88.5 kg)   BMI 28.80 kg/m    Physical Exam   General: Appearance:     Overweight male in  no acute distress  Eyes:    PERRL, conjunctiva/corneas clear, EOM's intact       Lungs:     Clear to auscultation bilaterally, respirations unlabored  Heart:    Bradycardic. Normal rhythm. No murmurs, rubs, or gallops.   MS:   All extremities are intact.   Neurologic:   Awake, alert, oriented x 3. No apparent focal neurological           defect.        Assessment & Plan     1. Essential hypertension Much better with increased dose of  amlodipine. Continue current medications.  Follow up 4-5 months.         The entirety of the information documented in the History of Present Illness, Review of Systems and Physical Exam were personally obtained by me. Portions of this information were initially documented by the CMA and reviewed by me for thoroughness and accuracy.      Lelon Huh, MD  [facility] [phone] (phone) [phone] (fax)  [place]

## 2019-09-16 ENCOUNTER — Ambulatory Visit (INDEPENDENT_AMBULATORY_CARE_PROVIDER_SITE_OTHER): Payer: Medicare HMO | Admitting: Family Medicine

## 2019-09-16 ENCOUNTER — Encounter: Payer: Self-pay | Admitting: Family Medicine

## 2019-09-16 ENCOUNTER — Other Ambulatory Visit: Payer: Self-pay

## 2019-09-16 VITALS — BP 132/65 | HR 57 | Temp 96.9°F | Wt 195.0 lb

## 2019-09-16 DIAGNOSIS — I10 Essential (primary) hypertension: Secondary | ICD-10-CM | POA: Diagnosis not present

## 2019-11-13 DIAGNOSIS — H269 Unspecified cataract: Secondary | ICD-10-CM | POA: Diagnosis not present

## 2019-12-18 ENCOUNTER — Ambulatory Visit (INDEPENDENT_AMBULATORY_CARE_PROVIDER_SITE_OTHER): Payer: Medicare HMO

## 2019-12-18 ENCOUNTER — Other Ambulatory Visit: Payer: Self-pay

## 2019-12-18 DIAGNOSIS — Z23 Encounter for immunization: Secondary | ICD-10-CM | POA: Diagnosis not present

## 2020-01-01 ENCOUNTER — Other Ambulatory Visit: Payer: Self-pay

## 2020-01-01 ENCOUNTER — Ambulatory Visit (INDEPENDENT_AMBULATORY_CARE_PROVIDER_SITE_OTHER): Payer: Medicare Other

## 2020-01-01 DIAGNOSIS — Z23 Encounter for immunization: Secondary | ICD-10-CM | POA: Diagnosis not present

## 2020-01-19 NOTE — Progress Notes (Signed)
Established patient visit   Patient: Dennis Cooper   DOB: 1932-06-23   86 y.o. Male  MRN: [id number] Visit Date: 01/20/2020  Today's healthcare provider: Lelon Huh, MD   Chief Complaint  Patient presents with  . Hypertension   Subjective    HPI  Hypertension, follow-up  BP Readings from Last 3 Encounters:  01/20/20 124/74  09/16/19 132/65  06/10/19 (!) 154/76   Wt Readings from Last 3 Encounters:  01/20/20 181 lb (82.1 kg)  09/16/19 195 lb (88.5 kg)  06/10/19 188 lb 3.2 oz (85.4 kg)     He was last seen for hypertension 4 months ago.  BP at that visit was 132/65. Management since that visit includes no changes.  He reports good compliance with treatment. He is not having side effects.  He is following a Regular diet. He is exercising. He walks 4-5 miles most days and goes on hike at least once a month He does not smoke.  Use of agents associated with hypertension: NSAIDS.   Outside blood pressures are not checked. Symptoms: No chest pain No chest pressure  No palpitations No syncope  No dyspnea No orthopnea  No paroxysmal nocturnal dyspnea No lower extremity edema   Pertinent labs: Lab Results  Component Value Date   CHOL 114 06/10/2019   HDL 56 06/10/2019   LDLCALC 45 06/10/2019   TRIG 60 06/10/2019   CHOLHDL 2.0 06/10/2019   Lab Results  Component Value Date   NA 138 06/10/2019   K 4.4 06/10/2019   CREATININE 0.92 06/10/2019   GFRNONAA 76 06/10/2019   GFRAA 88 06/10/2019   GLUCOSE 88 06/10/2019     The ASCVD Risk score (Goff DC Jr., et al., 2013) failed to calculate for the following reasons:   The 2013 ASCVD risk score is only valid for ages 39 to 43       Medications: Outpatient Medications Prior to Visit  Medication Sig  . amLODipine (NORVASC) 10 MG tablet Take 1 tablet (10 mg total) by mouth daily.  Marland Kitchen aspirin EC 81 MG tablet Take 81 mg by mouth daily.  Marland Kitchen losartan-hydrochlorothiazide (HYZAAR) 100-25 MG tablet Take 1 tablet by  mouth daily.  . Multiple Vitamins-Minerals (MULTIVITAMIN ADULT PO) Take 1 tablet by mouth daily.  . polyethylene glycol-electrolytes (NULYTELY/GOLYTELY) 420 g solution Take 4,000 mLs by mouth once.  . tadalafil (CIALIS) 5 MG tablet Take 10 mg by mouth as needed for erectile dysfunction (no more than 2 tablets in a day).   No facility-administered medications prior to visit.    Review of Systems  Constitutional: Negative.  Negative for appetite change, chills and fever.  Respiratory: Negative.  Negative for chest tightness, shortness of breath and wheezing.   Cardiovascular: Negative.  Negative for chest pain and palpitations.  Gastrointestinal: Negative for abdominal pain, nausea and vomiting.  Musculoskeletal: Negative.   Neurological: Negative.       Objective    BP 124/74 (BP Location: Left Arm, Patient Position: Sitting, Cuff Size: Normal)   Pulse (!) 54   Temp 97.7 F (36.5 C) (Oral)   Resp 16   Wt 181 lb (82.1 kg)   BMI 26.73 kg/m    Physical Exam   General: Appearance:     Well developed, well nourished male in no acute distress  Eyes:    PERRL, conjunctiva/corneas clear, EOM's intact       Lungs:     Clear to auscultation bilaterally, respirations unlabored  Heart:  Bradycardic. Normal rhythm. No murmurs, rubs, or gallops.   MS:   All extremities are intact.   Neurologic:   Awake, alert, oriented x 3. No apparent focal neurological           defect.         No results found for any visits on 01/20/20.  Assessment & Plan     1. Essential hypertension Doing very well. In great physical condition. Exercising regularly. Eating healthy. BP very well controlled. Continue current medications.  CPE with labs in March        The entirety of the information documented in the History of Present Illness, Review of Systems and Physical Exam were personally obtained by me. Portions of this information were initially documented by the CMA and reviewed by me for  thoroughness and accuracy.      Lelon Huh, MD  [facility] [phone] (phone) [phone] (fax)  [place]

## 2020-01-20 ENCOUNTER — Ambulatory Visit (INDEPENDENT_AMBULATORY_CARE_PROVIDER_SITE_OTHER): Payer: Medicare HMO | Admitting: Family Medicine

## 2020-01-20 ENCOUNTER — Encounter: Payer: Self-pay | Admitting: Family Medicine

## 2020-01-20 ENCOUNTER — Other Ambulatory Visit: Payer: Self-pay

## 2020-01-20 VITALS — BP 124/74 | HR 54 | Temp 97.7°F | Resp 16 | Wt 181.0 lb

## 2020-01-20 DIAGNOSIS — I10 Essential (primary) hypertension: Secondary | ICD-10-CM

## 2020-02-17 ENCOUNTER — Other Ambulatory Visit: Payer: Self-pay | Admitting: Family Medicine

## 2020-02-17 NOTE — Telephone Encounter (Signed)
Requested Prescriptions  Pending Prescriptions Disp Refills  . losartan-hydrochlorothiazide (HYZAAR) 100-25 MG tablet [Pharmacy Med Name: LOSARTAN POTASSIUM/HYDROCHLOROTHIAZIDE 100-25 MG Tablet] 90 tablet 1    Sig: TAKE 1 TABLET EVERY DAY     Cardiovascular: ARB + Diuretic Combos Failed - 02/17/2020 12:52 PM      Failed - K in normal range and within 180 days    Potassium  Date Value Ref Range Status  06/10/2019 4.4 3.5 - 5.2 mmol/L Final         Failed - Na in normal range and within 180 days    Sodium  Date Value Ref Range Status  06/10/2019 138 134 - 144 mmol/L Final         Failed - Cr in normal range and within 180 days    Creatinine, Ser  Date Value Ref Range Status  06/10/2019 0.92 0.76 - 1.27 mg/dL Final         Failed - Ca in normal range and within 180 days    Calcium  Date Value Ref Range Status  06/10/2019 9.0 8.6 - 10.2 mg/dL Final         Passed - Patient is not pregnant      Passed - Last BP in normal range    BP Readings from Last 1 Encounters:  01/20/20 124/74         Passed - Valid encounter within last 6 months    Recent Outpatient Visits          4 weeks ago Essential hypertension   [facility] Birdie Sons, MD   5 months ago Essential hypertension   [facility] Birdie Sons, MD   8 months ago Essential hypertension   [facility] Birdie Sons, MD   1 year ago Cellulitis of left lower extremity   [facility] Birdie Sons, MD   1 year ago History of prostate cancer   [facility] Birdie Sons, MD      Future Appointments            In 2 months Ralene Bathe, MD [place]   In 4 months Fisher, Kirstie Peri, MD [facility] [place]

## 2020-05-06 ENCOUNTER — Ambulatory Visit: Payer: Medicare HMO | Admitting: Dermatology

## 2020-05-06 ENCOUNTER — Other Ambulatory Visit: Payer: Self-pay

## 2020-05-06 DIAGNOSIS — Z85828 Personal history of other malignant neoplasm of skin: Secondary | ICD-10-CM

## 2020-05-06 DIAGNOSIS — D229 Melanocytic nevi, unspecified: Secondary | ICD-10-CM

## 2020-05-06 DIAGNOSIS — D1800 Hemangioma unspecified site: Secondary | ICD-10-CM | POA: Diagnosis not present

## 2020-05-06 DIAGNOSIS — L578 Other skin changes due to chronic exposure to nonionizing radiation: Secondary | ICD-10-CM | POA: Diagnosis not present

## 2020-05-06 DIAGNOSIS — B353 Tinea pedis: Secondary | ICD-10-CM | POA: Diagnosis not present

## 2020-05-06 DIAGNOSIS — L814 Other melanin hyperpigmentation: Secondary | ICD-10-CM | POA: Diagnosis not present

## 2020-05-06 DIAGNOSIS — Z1283 Encounter for screening for malignant neoplasm of skin: Secondary | ICD-10-CM | POA: Diagnosis not present

## 2020-05-06 DIAGNOSIS — L821 Other seborrheic keratosis: Secondary | ICD-10-CM | POA: Diagnosis not present

## 2020-05-06 DIAGNOSIS — L570 Actinic keratosis: Secondary | ICD-10-CM

## 2020-05-06 MED ORDER — KETOCONAZOLE 2 % EX CREA: 1.0000 "application " | TOPICAL_CREAM | Freq: Every day | CUTANEOUS | 4 refills | Status: DC

## 2020-05-06 NOTE — Progress Notes (Signed)
   Follow-Up Visit   Subjective  Dennis Cooper is a 87 y.o. male who presents for the following: Total body skin exam (Hx of BCC, Aks). The patient presents for Total-Body Skin Exam (TBSE) for skin cancer screening and mole check.  The following portions of the chart were reviewed this encounter and updated as appropriate:   Tobacco  Allergies  Meds  Problems  Med Hx  Surg Hx  Fam Hx     Review of Systems:  No other skin or systemic complaints except as noted in HPI or Assessment and Plan.  Objective  Well appearing patient in no apparent distress; mood and affect are within normal limits.  A full examination was performed including scalp, head, eyes, ears, nose, lips, neck, chest, axillae, abdomen, back, buttocks, bilateral upper extremities, bilateral lower extremities, hands, feet, fingers, toes, fingernails, and toenails. All findings within normal limits unless otherwise noted below.  Objective  Right medial upper eyelid, post neck: Well healed scar with no evidence of recurrence.   Objective  L of midline upper lip x 1, face/ears x 6 (7): Pink scaly macules   Objective  bil feet, toenails: Scaling and maceration web spaces and over distal and lateral soles, toenail dystrophy   Assessment & Plan    Lentigines - Scattered tan macules - Discussed due to sun exposure - Benign, observe - Call for any changes  Seborrheic Keratoses - Stuck-on, waxy, tan-brown papules and plaques  - Discussed benign etiology and prognosis. - Observe - Call for any changes  Melanocytic Nevi - Tan-brown and/or pink-flesh-colored symmetric macules and papules - Benign appearing on exam today - Observation - Call clinic for new or changing moles - Recommend daily use of broad spectrum spf 30+ sunscreen to sun-exposed areas.   Hemangiomas - Red papules - Discussed benign nature - Observe - Call for any changes  Actinic Damage - Chronic, secondary to cumulative UV/sun  exposure - diffuse scaly erythematous macules with underlying dyspigmentation - Recommend daily broad spectrum sunscreen SPF 30+ to sun-exposed areas, reapply every 2 hours as needed.  - Call for new or changing lesions.  Skin cancer screening performed today.  History of basal cell carcinoma (BCC) Right medial upper eyelid, post neck  Clear. Observe for recurrence. Call clinic for new or changing lesions.  Recommend regular skin exams, daily broad-spectrum spf 30+ sunscreen use, and photoprotection.     AK (actinic keratosis) (7) L of midline upper lip x 1, face/ears x 6  Destruction of lesion - L of midline upper lip x 1, face/ears x 6 Complexity: simple   Destruction method: cryotherapy   Informed consent: discussed and consent obtained   Timeout:  patient name, date of birth, surgical site, and procedure verified Lesion destroyed using liquid nitrogen: Yes   Region frozen until ice ball extended beyond lesion: Yes   Outcome: patient tolerated procedure well with no complications   Post-procedure details: wound care instructions given    Tinea pedis of both feet bil feet, toenails  /Tinea Unguium  Chronic, persistent  Start Ketoconazole 2% cr qhs to feet  ketoconazole (NIZORAL) 2 % cream - bil feet, toenails  Skin cancer screening  Return in about 6 months (around 11/03/2020) for AK f/u.   I, Dennis Cooper, RMA, am acting as scribe for Sarina Ser, MD .  Documentation: I have reviewed the above documentation for accuracy and completeness, and I agree with the above.  Sarina Ser, MD

## 2020-05-09 ENCOUNTER — Encounter: Payer: Self-pay | Admitting: Dermatology

## 2020-05-25 ENCOUNTER — Other Ambulatory Visit: Payer: Self-pay | Admitting: Family Medicine

## 2020-05-25 DIAGNOSIS — I10 Essential (primary) hypertension: Secondary | ICD-10-CM

## 2020-05-25 MED ORDER — AMLODIPINE BESYLATE 10 MG PO TABS: 10.0000 mg | ORAL_TABLET | Freq: Every day | ORAL | 3 refills | Status: DC

## 2020-05-25 MED ORDER — LOSARTAN POTASSIUM-HCTZ 100-25 MG PO TABS: 1.0000 | ORAL_TABLET | Freq: Every day | ORAL | 1 refills | Status: DC

## 2020-05-25 NOTE — Telephone Encounter (Signed)
Medication Refill - Medication: amLODipine (NORVASC) 10 MG tablet losartan-hydrochlorothiazide (HYZAAR) 100-25 MG tablet   Has the patient contacted their pharmacy? No. (Agent: If no, request that the patient contact the pharmacy for the refill.) (Agent: If yes, when and what did the pharmacy advise?)  Preferred Pharmacy (with phone number or street name):  [place], [place]  [place] [address]  Phone: [phone] Fax: [phone]     Agent: Please be advised that RX refills may take up to 3 business days. We ask that you follow-up with your pharmacy.

## 2020-05-25 NOTE — Telephone Encounter (Signed)
Requested Prescriptions  Pending Prescriptions Disp Refills  . losartan-hydrochlorothiazide (HYZAAR) 100-25 MG tablet 90 tablet 1    Sig: Take 1 tablet by mouth daily.     Cardiovascular: ARB + Diuretic Combos Failed - 05/25/2020  2:57 PM      Failed - K in normal range and within 180 days    Potassium  Date Value Ref Range Status  06/10/2019 4.4 3.5 - 5.2 mmol/L Final         Failed - Na in normal range and within 180 days    Sodium  Date Value Ref Range Status  06/10/2019 138 134 - 144 mmol/L Final         Failed - Cr in normal range and within 180 days    Creatinine, Ser  Date Value Ref Range Status  06/10/2019 0.92 0.76 - 1.27 mg/dL Final         Failed - Ca in normal range and within 180 days    Calcium  Date Value Ref Range Status  06/10/2019 9.0 8.6 - 10.2 mg/dL Final         Passed - Patient is not pregnant      Passed - Last BP in normal range    BP Readings from Last 1 Encounters:  01/20/20 124/74         Passed - Valid encounter within last 6 months    Recent Outpatient Visits          4 months ago Essential hypertension   [facility] Birdie Sons, MD   8 months ago Essential hypertension   [facility] Birdie Sons, MD   11 months ago Essential hypertension   [facility] Birdie Sons, MD   1 year ago Cellulitis of left lower extremity   [facility] Birdie Sons, MD   1 year ago History of prostate cancer   [facility] Birdie Sons, MD      Future Appointments            In 3 weeks Caryn Section, Kirstie Peri, MD [facility] [place]   In 5 months Ralene Bathe, MD [place]           . amLODipine (NORVASC) 10 MG tablet 90 tablet 3    Sig: Take 1 tablet (10 mg total) by mouth daily.     Cardiovascular:  Calcium Channel Blockers Passed - 05/25/2020  2:57 PM      Passed - Last BP in normal range    BP Readings from Last 1 Encounters:   01/20/20 124/74         Passed - Valid encounter within last 6 months    Recent Outpatient Visits          4 months ago Essential hypertension   [facility] Birdie Sons, MD   8 months ago Essential hypertension   [facility] Birdie Sons, MD   11 months ago Essential hypertension   [facility] Birdie Sons, MD   1 year ago Cellulitis of left lower extremity   [facility] Birdie Sons, MD   1 year ago History of prostate cancer   [facility] Birdie Sons, MD      Future Appointments            In 3 weeks Caryn Section, Kirstie Peri, MD [facility], [place]   In 5 months Ralene Bathe,  MD [place]

## 2020-06-15 NOTE — Progress Notes (Signed)
Subjective:   Dennis Cooper is a 87 y.o. male who presents for Medicare Annual/Subsequent preventive examination.  Review of Systems    N/A  Cardiac Risk Factors include: advanced age (>36men, >88 women);male gender;hypertension     Objective:    Today's Vitals   06/16/20 0821 06/16/20 0848  BP: (!) 152/78 132/68  Pulse: (!) 52   Temp: 97.7 F (36.5 C)   TempSrc: Oral   SpO2: 98%   Weight: 176 lb (79.8 kg)   Height: 5\' 9"  (1.753 m)   PainSc: 0-No pain    Body mass index is 25.99 kg/m.  Advanced Directives 06/16/2020 06/10/2019 05/27/2018 10/12/2017 05/23/2017  Does Patient Have a Medical Advance Directive? Yes Yes Yes Yes Yes  Type of Paramedic of [place];Living will [place];Living will [place];Living will [place];Living will [place];Living will  Copy of [place] in Chart? Yes - validated most recent copy scanned in chart (See row information) Yes - validated most recent copy scanned in chart (See row information) No - copy requested No - copy requested No - copy requested    Current Medications (verified) Outpatient Encounter Medications as of 06/16/2020  Medication Sig  . amLODipine (NORVASC) 10 MG tablet Take 1 tablet (10 mg total) by mouth daily.  Marland Kitchen aspirin EC 81 MG tablet Take 81 mg by mouth daily.  Marland Kitchen ketoconazole (NIZORAL) 2 % cream Apply 1 application topically daily. qhs to both feet and in between toes  . losartan-hydrochlorothiazide (HYZAAR) 100-25 MG tablet Take 1 tablet by mouth daily.  . Multiple Vitamins-Minerals (MULTIVITAMIN ADULT PO) Take 1 tablet by mouth daily.  . polyethylene glycol-electrolytes (NULYTELY/GOLYTELY) 420 g solution Take 4,000 mLs by mouth once.  . tadalafil (CIALIS) 5 MG tablet Take 10 mg by mouth as needed for erectile dysfunction (no more than 2 tablets in a day).   No facility-administered encounter medications  on file as of 06/16/2020.    Allergies (verified) Patient has no known allergies.   History: Past Medical History:  Diagnosis Date  . Cancer [facility])    prostate  . Erectile dysfunction   . History of basal cell cancer 12/27/2017   post neck - nodular  . History of chicken pox   . History of colonic polyps   . History of measles as a child   . Hx of basal cell carcinoma 09/06/2017   right medial upper eyelid - nodular  . Hypertension   . Plantar fasciitis 10/19/2014  . Precancerous lesion    Past Surgical History:  Procedure Laterality Date  . CHOLECYSTECTOMY  2010   Dr. Jamal Collin  . COLONOSCOPY  04/2008  . COLONOSCOPY WITH PROPOFOL N/A 10/12/2017   Procedure: COLONOSCOPY WITH PROPOFOL;  Surgeon: Manya Silvas, MD;  Location: [facility] ENDOSCOPY;  Service: Endoscopy;  Laterality: N/A;  . ESOPHAGOGASTRODUODENOSCOPY    . HERNIA REPAIR  2010   inguinal; Dr. Jamal Collin  . stomach ulcer  1971   Family History  Problem Relation Age of Onset  . Diabetes Sister   . Congestive Heart Failure Brother   . Diabetes Brother   . Diabetes Sister   . Heart attack Sister   . Diabetes Sister   . Arthritis Sister   . Hypertension Sister   . Diabetes Brother   . Hypertension Brother   . Arthritis Brother   . Cancer Brother   . Heart attack Brother    Social History  Socioeconomic History  . Marital status: Married    Spouse name: Not on file  . Number of children: 3  . Years of education: Not on file  . Highest education level: Some college, no degree  Occupational History  . Occupation: retired  Tobacco Use  . Smoking status: Former Smoker    Types: Cigarettes    Quit date: 03/20/1978    Years since quitting: 42.2  . Smokeless tobacco: Never Used  Vaping Use  . Vaping Use: Never used  Substance and Sexual Activity  . Alcohol use: Not Currently    Alcohol/week: 0.0 standard drinks  . Drug use: No  . Sexual activity: Not on file  Other Topics Concern  . Not on file  Social  History Narrative  . Not on file   Social Determinants of Health   Financial Resource Strain: Low Risk   . Difficulty of Paying Living Expenses: Not hard at all  Food Insecurity: No Food Insecurity  . Worried About Charity fundraiser in the Last Year: Never true  . Ran Out of Food in the Last Year: Never true  Transportation Needs: No Transportation Needs  . Lack of Transportation (Medical): No  . Lack of Transportation (Non-Medical): No  Physical Activity: Sufficiently Active  . Days of Exercise per Week: 5 days  . Minutes of Exercise per Session: 120 min  Stress: No Stress Concern Present  . Feeling of Stress : Not at all  Social Connections: Moderately Isolated  . Frequency of Communication with Friends and Family: More than three times a week  . Frequency of Social Gatherings with Friends and Family: More than three times a week  . Attends Religious Services: Never  . Active Member of Clubs or Organizations: No  . Attends Archivist Meetings: Never  . Marital Status: Married    Tobacco Counseling Counseling given: Not Answered   Clinical Intake:  Pre-visit preparation completed: Yes  Pain : No/denies pain Pain Score: 0-No pain     Nutritional Status: BMI 25 -29 Overweight Nutritional Risks: None Diabetes: No  How often do you need to have someone help you when you read instructions, pamphlets, or other written materials from your doctor or pharmacy?: 1 - Never  Diabetic? No  Interpreter Needed?: No  Information entered by :: [facility], LPN   Activities of Daily Living In your present state of health, do you have any difficulty performing the following activities: 06/16/2020 01/20/2020  Hearing? Tempie Donning  Comment Wears bilateral hearing aids. -  Vision? N N  Difficulty concentrating or making decisions? N N  Walking or climbing stairs? N N  Dressing or bathing? N N  Doing errands, shopping? N N  Preparing Food and eating ? N -  Using the Toilet? N  -  In the past six months, have you accidently leaked urine? N -  Do you have problems with loss of bowel control? N -  Managing your Medications? N -  Managing your Finances? N -  Housekeeping or managing your Housekeeping? N -  Some recent data might be hidden    Patient Care Team: Birdie Sons, MD as PCP - General (Family Medicine) Ralene Bathe, MD (Dermatology) [place], [facility]) [place], [place], OD (Optometry)  Indicate any recent Medical Services you may have received from other than [facility] providers in the past year (date may be approximate).     Assessment:   This is a routine wellness examination for Dennis Cooper.  Hearing/Vision screen No exam data present  Dietary issues and exercise activities discussed: Current Exercise Habits: Home exercise routine;Structured exercise class, Type of exercise: strength training/weights;walking, Time (Minutes): > 60, Frequency (Times/Week): 5, Weekly Exercise (Minutes/Week): 0, Intensity: Moderate, Exercise limited by: None identified  Goals   None    Depression Screen PHQ 2/9 Scores 06/16/2020 06/10/2019 05/27/2018 05/27/2018 05/23/2017 05/23/2017 05/15/2016  PHQ - 2 Score 0 0 0 0 0 0 0  PHQ- 9 Score - - 0 - 0 - -    Fall Risk Fall Risk  06/16/2020 06/10/2019 05/27/2018 05/23/2017 05/15/2016  Falls in the past year? 0 0 0 No No  Number falls in past yr: 0 0 - - -  Injury with Fall? 0 0 - - -    FALL RISK PREVENTION PERTAINING TO THE HOME:  Any stairs in or around the home? No  If so, are there any without handrails? No  Home free of loose throw rugs in walkways, pet beds, electrical cords, etc? Yes  Adequate lighting in your home to reduce risk of falls? Yes   ASSISTIVE DEVICES UTILIZED TO PREVENT FALLS:  Life alert? No  Use of a cane, walker or w/c? No  Grab bars in the bathroom? Yes  Shower chair or bench in shower? Yes  Elevated toilet seat or a handicapped toilet? No   TIMED UP AND GO:  Was the test  performed? Yes .  Length of time to ambulate 10 feet: 9 sec.   Gait steady and fast without use of assistive device  Cognitive Function: Normal cognitive status assessed by direct observation by this Nurse Health Advisor. No abnormalities found.       6CIT Screen 06/10/2019 05/27/2018 05/23/2017  What Year? 0 points 0 points 0 points  What month? 0 points 0 points 0 points  What time? 0 points 0 points 0 points  Count back from 20 0 points 0 points 0 points  Months in reverse 2 points 0 points 0 points  Repeat phrase 2 points 2 points 2 points  Total Score 4 2 2     Immunizations Immunization History  Administered Date(s) Administered  . Fluad Quad(high Dose 65+) 12/25/2018, 12/18/2019  . Influenza Split 01/11/2011, 12/26/2011  . Influenza, High Dose Seasonal PF 12/23/2013, 02/04/2015, 12/18/2015, 12/21/2016, 01/10/2018  . Influenza,inj,Quad PF,6+ Mos 12/07/2012  . PFIZER(Purple Top)SARS-COV-2 Vaccination 03/25/2019, 04/15/2019, 01/01/2020  . Pneumococcal Conjugate-13 05/02/2013  . Pneumococcal Polysaccharide-23 06/22/2005  . Td 03/20/2004  . Tdap 10/04/2011  . Zoster 10/04/2011    TDAP status: Up to date  Flu Vaccine status: Up to date  Pneumococcal vaccine status: Up to date  Covid-19 vaccine status: Completed vaccines  Qualifies for Shingles Vaccine? Yes   Zostavax completed Yes   Shingrix Completed?: No.    Education has been provided regarding the importance of this vaccine. Patient has been advised to call insurance company to determine out of pocket expense if they have not yet received this vaccine. Advised may also receive vaccine at local pharmacy or Health Dept. Verbalized acceptance and understanding.  Screening Tests Health Maintenance  Topic Date Due  . COVID-19 Vaccine (4 - Booster for Pfizer series) 07/01/2020  . TETANUS/TDAP  10/03/2021  . COLONOSCOPY (Pts 45-13yrs Insurance coverage will need to be confirmed)  10/13/2022  . INFLUENZA VACCINE  Completed   . PNA vac Low Risk Adult  Completed  . HPV VACCINES  Aged Out    Health Maintenance  There are no preventive care reminders to display  for this patient.  Colorectal cancer screening: Type of screening: Colonoscopy. Completed 10/12/17. Repeat every 5 years  Lung Cancer Screening: (Low Dose CT Chest recommended if Age 73-80 years, 30 pack-year currently smoking OR have quit w/in 15years.) does not qualify.   Additional Screening:  Vision Screening: Recommended annual ophthalmology exams for early detection of glaucoma and other disorders of the eye. Is the patient up to date with their annual eye exam?  Yes  Who is the provider or what is the name of the office in which the patient attends annual eye exams? Dr Annamaria Helling @ [place] If pt is not established with a provider, would they like to be referred to a provider to establish care? No .   Dental Screening: Recommended annual dental exams for proper oral hygiene  Community Resource Referral / Chronic Care Management: CRR required this visit?  No   CCM required this visit?  No      Plan:     I have personally reviewed and noted the following in the patient's chart:   . Medical and social history . Use of alcohol, tobacco or illicit drugs  . Current medications and supplements . Functional ability and status . Nutritional status . Physical activity . Advanced directives . List of other physicians . Hospitalizations, surgeries, and ER visits in previous 12 months . Vitals . Screenings to include cognitive, depression, and falls . Referrals and appointments  In addition, I have reviewed and discussed with patient certain preventive protocols, quality metrics, and best practice recommendations. A written personalized care plan for preventive services as well as general preventive health recommendations were provided to patient.     Raquan Iannone [place], [place]   3/71/0626   Nurse Notes: None.

## 2020-06-16 ENCOUNTER — Other Ambulatory Visit: Payer: Self-pay

## 2020-06-16 ENCOUNTER — Ambulatory Visit (INDEPENDENT_AMBULATORY_CARE_PROVIDER_SITE_OTHER): Payer: Medicare HMO | Admitting: Family Medicine

## 2020-06-16 ENCOUNTER — Encounter: Payer: Self-pay | Admitting: Family Medicine

## 2020-06-16 ENCOUNTER — Ambulatory Visit (INDEPENDENT_AMBULATORY_CARE_PROVIDER_SITE_OTHER): Payer: Medicare HMO

## 2020-06-16 VITALS — BP 132/68 | HR 52 | Temp 97.7°F | Ht 69.0 in | Wt 176.0 lb

## 2020-06-16 VITALS — BP 155/78 | HR 53 | Ht 69.0 in | Wt 176.0 lb

## 2020-06-16 DIAGNOSIS — I10 Essential (primary) hypertension: Secondary | ICD-10-CM | POA: Diagnosis not present

## 2020-06-16 DIAGNOSIS — Z125 Encounter for screening for malignant neoplasm of prostate: Secondary | ICD-10-CM | POA: Diagnosis not present

## 2020-06-16 DIAGNOSIS — R001 Bradycardia, unspecified: Secondary | ICD-10-CM | POA: Diagnosis not present

## 2020-06-16 DIAGNOSIS — M542 Cervicalgia: Secondary | ICD-10-CM

## 2020-06-16 DIAGNOSIS — Z0000 Encounter for general adult medical examination without abnormal findings: Secondary | ICD-10-CM

## 2020-06-16 MED ORDER — NAPROXEN 500 MG PO TABS: 500.0000 mg | ORAL_TABLET | Freq: Every day | ORAL | 0 refills | Status: AC

## 2020-06-16 NOTE — Progress Notes (Signed)
Complete physical exam   Patient: Dennis Cooper   DOB: Aug 22, 1932   87 y.o. Male  MRN: [id number] Visit Date: 06/16/2020  Today's healthcare provider: Lelon Huh, MD   Chief Complaint  Patient presents with  . Annual Exam  . Hypertension   Subjective    Dennis Cooper is a 87 y.o. male who presents today for a complete physical exam.  He reports consuming a general diet. Gym/ health club routine includes exercise class 5 times weekly. He generally feels fairly well. He reports sleeping poorly. He does have additional problems to discuss today.   Has AWV with [facility] today at 8:20am.  HPI  Hypertension, follow-up  BP Readings from Last 3 Encounters:  06/16/20 (!) 155/78  06/16/20 132/68  01/20/20 124/74   Wt Readings from Last 3 Encounters:  06/16/20 176 lb (79.8 kg)  06/16/20 176 lb (79.8 kg)  01/20/20 181 lb (82.1 kg)     He was last seen for hypertension 4 months ago.  BP at that visit was 124/74. Management since that visit includes continue same medication.  He reports good compliance with treatment. He is not having side effects.  He is following a Regular diet. He is exercising. He does not smoke.  Use of agents associated with hypertension: NSAIDS.   Outside blood pressures are not being checked.  Symptoms: No chest pain No chest pressure  No palpitations No syncope  No dyspnea No orthopnea  No paroxysmal nocturnal dyspnea No lower extremity edema   Pertinent labs: Lab Results  Component Value Date   CHOL 114 06/10/2019   HDL 56 06/10/2019   LDLCALC 45 06/10/2019   TRIG 60 06/10/2019   CHOLHDL 2.0 06/10/2019   Lab Results  Component Value Date   NA 138 06/10/2019   K 4.4 06/10/2019   CREATININE 0.92 06/10/2019   GFRNONAA 76 06/10/2019   GFRAA 88 06/10/2019   GLUCOSE 88 06/10/2019     The ASCVD Risk score (Goff DC Jr., et al., 2013) failed to calculate for the following reasons:   The 2013 ASCVD risk score is only valid for ages 32 to  32   ---------------------------------------------------------------------------------------------------  Past Medical History:  Diagnosis Date  . Cancer [facility])    prostate  . Erectile dysfunction   . History of basal cell cancer 12/27/2017   post neck - nodular  . History of chicken pox   . History of colonic polyps   . History of measles as a child   . Hx of basal cell carcinoma 09/06/2017   right medial upper eyelid - nodular  . Hypertension   . Plantar fasciitis 10/19/2014  . Precancerous lesion    Past Surgical History:  Procedure Laterality Date  . CHOLECYSTECTOMY  2010   Dr. Jamal Collin  . COLONOSCOPY  04/2008  . COLONOSCOPY WITH PROPOFOL N/A 10/12/2017   Procedure: COLONOSCOPY WITH PROPOFOL;  Surgeon: Manya Silvas, MD;  Location: [facility] ENDOSCOPY;  Service: Endoscopy;  Laterality: N/A;  . ESOPHAGOGASTRODUODENOSCOPY    . HERNIA REPAIR  2010   inguinal; Dr. Jamal Collin  . stomach ulcer  1971   Social History   Socioeconomic History  . Marital status: Married    Spouse name: Not on file  . Number of children: 3  . Years of education: Not on file  . Highest education level: Some college, no degree  Occupational History  . Occupation: retired  Tobacco Use  . Smoking status: Former Smoker    Types: Cigarettes  Quit date: 03/20/1978    Years since quitting: 42.2  . Smokeless tobacco: Never Used  Vaping Use  . Vaping Use: Never used  Substance and Sexual Activity  . Alcohol use: Not Currently    Alcohol/week: 0.0 standard drinks  . Drug use: No  . Sexual activity: Not on file  Other Topics Concern  . Not on file  Social History Narrative  . Not on file   Social Determinants of Health   Financial Resource Strain: Low Risk   . Difficulty of Paying Living Expenses: Not hard at all  Food Insecurity: No Food Insecurity  . Worried About Charity fundraiser in the Last Year: Never true  . Ran Out of Food in the Last Year: Never true  Transportation Needs: No  Transportation Needs  . Lack of Transportation (Medical): No  . Lack of Transportation (Non-Medical): No  Physical Activity: Sufficiently Active  . Days of Exercise per Week: 5 days  . Minutes of Exercise per Session: 120 min  Stress: No Stress Concern Present  . Feeling of Stress : Not at all  Social Connections: Moderately Isolated  . Frequency of Communication with Friends and Family: More than three times a week  . Frequency of Social Gatherings with Friends and Family: More than three times a week  . Attends Religious Services: Never  . Active Member of Clubs or Organizations: No  . Attends Archivist Meetings: Never  . Marital Status: Married  Human resources officer Violence: Not At Risk  . Fear of Current or Ex-Partner: No  . Emotionally Abused: No  . Physically Abused: No  . Sexually Abused: No   Family Status  Relation Name Status  . Mother  Deceased at age 34       natural causes  . Father  Deceased at age 16       MI  . Sister  Deceased at age 79       Fire accident  . Brother  Deceased at age 24  . Sister  Deceased  . Sister  Alive  . Brother  Alive       had a quad-bypass   Family History  Problem Relation Age of Onset  . Diabetes Sister   . Congestive Heart Failure Brother   . Diabetes Brother   . Diabetes Sister   . Heart attack Sister   . Diabetes Sister   . Arthritis Sister   . Hypertension Sister   . Diabetes Brother   . Hypertension Brother   . Arthritis Brother   . Cancer Brother   . Heart attack Brother    No Known Allergies  Patient Care Team: Birdie Sons, MD as PCP - General (Family Medicine) Ralene Bathe, MD (Dermatology) [place], [facility]) Arelia Sneddon, [place] [facility])   Medications: Outpatient Medications Prior to Visit  Medication Sig  . amLODipine (NORVASC) 10 MG tablet Take 1 tablet (10 mg total) by mouth daily.  Marland Kitchen aspirin EC 81 MG tablet Take 81 mg by mouth daily.  Marland Kitchen ketoconazole (NIZORAL) 2  % cream Apply 1 application topically daily. qhs to both feet and in between toes  . losartan-hydrochlorothiazide (HYZAAR) 100-25 MG tablet Take 1 tablet by mouth daily.  . Multiple Vitamins-Minerals (MULTIVITAMIN ADULT PO) Take 1 tablet by mouth daily.  . polyethylene glycol-electrolytes (NULYTELY/GOLYTELY) 420 g solution Take 4,000 mLs by mouth once.  . tadalafil (CIALIS) 5 MG tablet Take 10 mg by mouth as needed for erectile dysfunction (  no more than 2 tablets in a day).   No facility-administered medications prior to visit.    Review of Systems  Constitutional: Negative for appetite change, chills, fatigue and fever.  HENT: Positive for hearing loss. Negative for congestion, ear pain, nosebleeds and trouble swallowing.   Eyes: Negative for pain and visual disturbance.  Respiratory: Negative for cough, chest tightness and shortness of breath.   Cardiovascular: Negative for chest pain, palpitations and leg swelling.  Gastrointestinal: Negative for abdominal pain, blood in stool, constipation, diarrhea, nausea and vomiting.  Endocrine: Positive for polyuria. Negative for polydipsia and polyphagia.  Genitourinary: Positive for frequency. Negative for dysuria and flank pain.  Musculoskeletal: Positive for neck pain (left side) and neck stiffness. Negative for arthralgias, back pain, joint swelling and myalgias.  Skin: Negative for color change, rash and wound.  Neurological: Negative for dizziness, tremors, seizures, speech difficulty, weakness, light-headedness and headaches.  Hematological: Bruises/bleeds easily.  Psychiatric/Behavioral: Positive for sleep disturbance. Negative for behavioral problems, confusion, decreased concentration and dysphoric mood. The patient is not nervous/anxious.   All other systems reviewed and are negative.     Objective    BP (!) 155/78 (BP Location: Right Arm, Patient Position: Sitting, Cuff Size: Large)   Pulse (!) 53   Ht 5\' 9"  (1.753 m)   Wt 176 lb  (79.8 kg)   BMI 25.99 kg/m    Physical Exam    General Appearance:     Overweight male. Alert, cooperative, in no acute distress, appears stated age  Head:    Normocephalic, without obvious abnormality, atraumatic  Eyes:    PERRL, conjunctiva/corneas clear, EOM's intact, fundi    benign, both eyes       Ears:    Normal TM's and external ear canals, both ears  Neck:   Supple, symmetrical, trachea midline, no adenopathy;       thyroid:  No enlargement/tenderness/nodules; no carotid   bruit or JVD  Back:     Symmetric, no curvature, ROM normal, no CVA tenderness  Lungs:     Clear to auscultation bilaterally, respirations unlabored  Chest wall:    No tenderness or deformity  Heart:    Bradycardic. Normal rhythm. No murmurs, rubs, or gallops.  S1 and S2 normal  Abdomen:     Soft, non-tender, bowel sounds active all four quadrants,    no masses, no organomegaly  Genitalia:    deferred  Rectal:    deferred  Extremities:   All extremities are intact. No cyanosis or edema. Tender over left SCM  Pulses:   2+ and symmetric all extremities  Skin:   Skin color, texture, turgor normal, no rashes or lesions  Lymph nodes:   Cervical, supraclavicular, and axillary nodes normal  Neurologic:   CNII-XII intact. Normal Cooper, sensation and reflexes      throughout     Last depression screening scores PHQ 2/9 Scores 06/16/2020 06/10/2019 05/27/2018  PHQ - 2 Score 0 0 0  PHQ- 9 Score - - 0   Last fall risk screening Fall Risk  06/16/2020  Falls in the past year? 0  Number falls in past yr: 0  Injury with Fall? 0   Last Audit-C alcohol use screening Alcohol Use Disorder Test (AUDIT) 06/16/2020  1. How often do you have a drink containing alcohol? 0  2. How many drinks containing alcohol do you have on a typical day when you are drinking? 0  3. How often do you have six or more drinks on one  occasion? 0  AUDIT-C Score 0  Alcohol Brief Interventions/Follow-up AUDIT Score <7 follow-up not  indicated   A score of 3 or more in women, and 4 or more in men indicates increased risk for alcohol abuse, EXCEPT if all of the points are from question 1   No results found for any visits on 06/16/20.  Assessment & Plan    Routine Health Maintenance and Physical Exam  Exercise Activities and Dietary recommendations Goals   None     Immunization History  Administered Date(s) Administered  . Fluad Quad(high Dose 65+) 12/25/2018, 12/18/2019  . Influenza Split 01/11/2011, 12/26/2011  . Influenza, High Dose Seasonal PF 12/23/2013, 02/04/2015, 12/18/2015, 12/21/2016, 01/10/2018  . Influenza,inj,Quad PF,6+ Mos 12/07/2012  . PFIZER(Purple Top)SARS-COV-2 Vaccination 03/25/2019, 04/15/2019, 01/01/2020  . Pneumococcal Conjugate-13 05/02/2013  . Pneumococcal Polysaccharide-23 06/22/2005  . Td 03/20/2004  . Tdap 10/04/2011  . Zoster 10/04/2011  . Zoster Recombinat (Shingrix) 05/16/2020    Health Maintenance  Topic Date Due  . COVID-19 Vaccine (4 - Booster for Pfizer series) 07/01/2020  . TETANUS/TDAP  10/03/2021  . COLONOSCOPY (Pts 45-35yrs Insurance coverage will need to be confirmed)  10/13/2022  . INFLUENZA VACCINE  Completed  . PNA vac Low Risk Adult  Completed  . HPV VACCINES  Aged Out    Discussed health benefits of physical activity, and encouraged him to engage in regular exercise appropriate for his age and condition.   1. Annual physical exam Generally doing very well.   2. Bradycardia Chronic and stable likely due to excellent physical conditioning.  - EKG 12-Lead  3. Primary hypertension SBP staying consistently elevated. See how labs look, consider adding addition hctz or change to more potent ARB.  - Comprehensive metabolic panel - Lipid panel - CBC - TSH  4. Neck pain on left side He likely strained SCM when starting having pain a few weeks ago. Is only bothering him at night, but is moderate to severe in intensity.  - naproxen (NAPROSYN) 500 MG tablet;  Take 1 tablet (500 mg total) by mouth at bedtime for 15 days.  Dispense: 15 tablet; Refill: 0  5. Prostate cancer screening  - PSA Total (Reflex To Free) ([facility] only)       The entirety of the information documented in the History of Present Illness, Review of Systems and Physical Exam were personally obtained by me. Portions of this information were initially documented by the CMA and reviewed by me for thoroughness and accuracy.      Lelon Huh, MD  [facility] [phone] (phone) [phone] (fax)  [place]

## 2020-06-16 NOTE — Patient Instructions (Signed)
[name] , Thank you for taking time to come for your Medicare Wellness Visit. I appreciate your ongoing commitment to your health goals. Please review the following plan we discussed and let me know if I can assist you in the future.   Screening recommendations/referrals: Colonoscopy: Up to date, due 09/2022 Recommended yearly ophthalmology/optometry visit for glaucoma screening and checkup Recommended yearly dental visit for hygiene and checkup  Vaccinations: Influenza vaccine: Done 12/18/19 Pneumococcal vaccine: Completed series Tdap vaccine: Up to date, due 09/2021 Shingles vaccine: Shingrix discussed. Please contact your pharmacy for coverage information.     Advanced directives: Currently on file.   Conditions/risks identified: None.  Next appointment: 9:00 AM today with Dr Caryn Section   Preventive Care 34 Years and Older, Male Preventive care refers to lifestyle choices and visits with your health care provider that can promote health and wellness. What does preventive care include?  A yearly physical exam. This is also called an annual well check.  Dental exams once or twice a year.  Routine eye exams. Ask your health care provider how often you should have your eyes checked.  Personal lifestyle choices, including:  Daily care of your teeth and gums.  Regular physical activity.  Eating a healthy diet.  Avoiding tobacco and drug use.  Limiting alcohol use.  Practicing safe sex.  Taking low doses of aspirin every day.  Taking vitamin and mineral supplements as recommended by your health care provider. What happens during an annual well check? The services and screenings done by your health care provider during your annual well check will depend on your age, overall health, lifestyle risk factors, and family history of disease. Counseling  Your health care provider may ask you questions about your:  Alcohol use.  Tobacco use.  Drug use.  Emotional  well-being.  Home and relationship well-being.  Sexual activity.  Eating habits.  History of falls.  Memory and ability to understand (cognition).  Work and work Statistician. Screening  You may have the following tests or measurements:  Height, weight, and BMI.  Blood pressure.  Lipid and cholesterol levels. These may be checked every 5 years, or more frequently if you are over 72 years old.  Skin check.  Lung cancer screening. You may have this screening every year starting at age 14 if you have a 30-pack-year history of smoking and currently smoke or have quit within the past 15 years.  Fecal occult blood test (FOBT) of the stool. You may have this test every year starting at age 41.  Flexible sigmoidoscopy or colonoscopy. You may have a sigmoidoscopy every 5 years or a colonoscopy every 10 years starting at age 74.  Prostate cancer screening. Recommendations will vary depending on your family history and other risks.  Hepatitis C blood test.  Hepatitis B blood test.  Sexually transmitted disease (STD) testing.  Diabetes screening. This is done by checking your blood sugar (glucose) after you have not eaten for a while (fasting). You may have this done every 1-3 years.  Abdominal aortic aneurysm (AAA) screening. You may need this if you are a current or former smoker.  Osteoporosis. You may be screened starting at age 35 if you are at high risk. Talk with your health care provider about your test results, treatment options, and if necessary, the need for more tests. Vaccines  Your health care provider may recommend certain vaccines, such as:  Influenza vaccine. This is recommended every year.  Tetanus, diphtheria, and acellular pertussis (Tdap, Td)  vaccine. You may need a Td booster every 10 years.  Zoster vaccine. You may need this after age 29.  Pneumococcal 13-valent conjugate (PCV13) vaccine. One dose is recommended after age 72.  Pneumococcal  polysaccharide (PPSV23) vaccine. One dose is recommended after age 30. Talk to your health care provider about which screenings and vaccines you need and how often you need them. This information is not intended to replace advice given to you by your health care provider. Make sure you discuss any questions you have with your health care provider. Document Released: 04/02/2015 Document Revised: 11/24/2015 Document Reviewed: 01/05/2015 Elsevier Interactive Patient Education  2017 [employer] Prevention in the Home Falls can cause injuries. They can happen to people of all ages. There are many things you can do to make your home safe and to help prevent falls. What can I do on the outside of my home?  Regularly fix the edges of walkways and driveways and fix any cracks.  Remove anything that might make you trip as you walk through a door, such as a raised step or threshold.  Trim any bushes or trees on the path to your home.  Use bright outdoor lighting.  Clear any walking paths of anything that might make someone trip, such as rocks or tools.  Regularly check to see if handrails are loose or broken. Make sure that both sides of any steps have handrails.  Any raised decks and porches should have guardrails on the edges.  Have any leaves, snow, or ice cleared regularly.  Use sand or salt on walking paths during winter.  Clean up any spills in your garage right away. This includes oil or grease spills. What can I do in the bathroom?  Use night lights.  Install grab bars by the toilet and in the tub and shower. Do not use towel bars as grab bars.  Use non-skid mats or decals in the tub or shower.  If you need to sit down in the shower, use a plastic, non-slip stool.  Keep the floor dry. Clean up any water that spills on the floor as soon as it happens.  Remove soap buildup in the tub or shower regularly.  Attach bath mats securely with double-sided non-slip rug  tape.  Do not have throw rugs and other things on the floor that can make you trip. What can I do in the bedroom?  Use night lights.  Make sure that you have a light by your bed that is easy to reach.  Do not use any sheets or blankets that are too big for your bed. They should not hang down onto the floor.  Have a firm chair that has side arms. You can use this for support while you get dressed.  Do not have throw rugs and other things on the floor that can make you trip. What can I do in the kitchen?  Clean up any spills right away.  Avoid walking on wet floors.  Keep items that you use a lot in easy-to-reach places.  If you need to reach something above you, use a strong step stool that has a grab bar.  Keep electrical cords out of the way.  Do not use floor polish or wax that makes floors slippery. If you must use wax, use non-skid floor wax.  Do not have throw rugs and other things on the floor that can make you trip. What can I do with my stairs?  Do not leave  any items on the stairs.  Make sure that there are handrails on both sides of the stairs and use them. Fix handrails that are broken or loose. Make sure that handrails are as long as the stairways.  Check any carpeting to make sure that it is firmly attached to the stairs. Fix any carpet that is loose or worn.  Avoid having throw rugs at the top or bottom of the stairs. If you do have throw rugs, attach them to the floor with carpet tape.  Make sure that you have a light switch at the top of the stairs and the bottom of the stairs. If you do not have them, ask someone to add them for you. What else can I do to help prevent falls?  Wear shoes that:  Do not have high heels.  Have rubber bottoms.  Are comfortable and fit you well.  Are closed at the toe. Do not wear sandals.  If you use a stepladder:  Make sure that it is fully opened. Do not climb a closed stepladder.  Make sure that both sides of the  stepladder are locked into place.  Ask someone to hold it for you, if possible.  Clearly mark and make sure that you can see:  Any grab bars or handrails.  First and last steps.  Where the edge of each step is.  Use tools that help you move around (mobility aids) if they are needed. These include:  Canes.  Walkers.  Scooters.  Crutches.  Turn on the lights when you go into a dark area. Replace any light bulbs as soon as they burn out.  Set up your furniture so you have a clear path. Avoid moving your furniture around.  If any of your floors are uneven, fix them.  If there are any pets around you, be aware of where they are.  Review your medicines with your doctor. Some medicines can make you feel dizzy. This can increase your chance of falling. Ask your doctor what other things that you can do to help prevent falls. This information is not intended to replace advice given to you by your health care provider. Make sure you discuss any questions you have with your health care provider. Document Released: 12/31/2008 Document Revised: 08/12/2015 Document Reviewed: 04/10/2014 Elsevier Interactive Patient Education  2017 [employer].

## 2020-06-17 ENCOUNTER — Other Ambulatory Visit: Payer: Self-pay

## 2020-06-17 DIAGNOSIS — I10 Essential (primary) hypertension: Secondary | ICD-10-CM

## 2020-06-17 LAB — CBC
Hematocrit: 47.4 % (ref 37.5–51.0)
Hemoglobin: 16.2 g/dL (ref 13.0–17.7)
MCH: 30.9 pg (ref 26.6–33.0)
MCHC: 34.2 g/dL (ref 31.5–35.7)
MCV: 91 fL (ref 79–97)
Platelets: 196 10*3/uL (ref 150–450)
RBC: 5.24 x10E6/uL (ref 4.14–5.80)
RDW: 11.9 % (ref 11.6–15.4)
WBC: 7.8 10*3/uL (ref 3.4–10.8)

## 2020-06-17 LAB — COMPREHENSIVE METABOLIC PANEL
ALT: 33 IU/L (ref 0–44)
AST: 36 IU/L (ref 0–40)
Albumin/Globulin Ratio: 2.6 — ABNORMAL HIGH (ref 1.2–2.2)
Albumin: 5 g/dL — ABNORMAL HIGH (ref 3.6–4.6)
Alkaline Phosphatase: 72 IU/L (ref 44–121)
BUN/Creatinine Ratio: 22 (ref 10–24)
BUN: 21 mg/dL (ref 8–27)
Bilirubin Total: 0.8 mg/dL (ref 0.0–1.2)
CO2: 20 mmol/L (ref 20–29)
Calcium: 9.9 mg/dL (ref 8.6–10.2)
Chloride: 97 mmol/L (ref 96–106)
Creatinine, Ser: 0.97 mg/dL (ref 0.76–1.27)
Globulin, Total: 1.9 g/dL (ref 1.5–4.5)
Glucose: 91 mg/dL (ref 65–99)
Potassium: 4.8 mmol/L (ref 3.5–5.2)
Sodium: 137 mmol/L (ref 134–144)
Total Protein: 6.9 g/dL (ref 6.0–8.5)
eGFR: 77 mL/min/{1.73_m2} (ref >59)

## 2020-06-17 LAB — PSA TOTAL (REFLEX TO FREE): Prostate Specific Ag, Serum: 1.5 ng/mL (ref 0.0–4.0)

## 2020-06-17 LAB — TSH: TSH: 2.05 u[IU]/mL (ref 0.450–4.500)

## 2020-06-17 LAB — LIPID PANEL
Chol/HDL Ratio: 2 ratio (ref 0.0–5.0)
Cholesterol, Total: 149 mg/dL (ref 100–199)
HDL: 74 mg/dL (ref >39)
LDL Chol Calc (NIH): 64 mg/dL (ref 0–99)
Triglycerides: 52 mg/dL (ref 0–149)
VLDL Cholesterol Cal: 11 mg/dL (ref 5–40)

## 2020-06-17 MED ORDER — HYDROCHLOROTHIAZIDE 12.5 MG PO TABS: 12.5000 mg | ORAL_TABLET | Freq: Every day | ORAL | 1 refills | Status: DC

## 2020-06-17 NOTE — Telephone Encounter (Signed)
Advised patient of results. Just to confirm, you would like to ADD HCTZ 12.5mg  daily along WITH losartan/hctz 100/25mg  and amlodipine 10mg ?   It flagged me sending in the prescription. Please advise. Thanks!

## 2020-06-17 NOTE — Telephone Encounter (Signed)
Medication sent into the pharmacy. Patient advised.

## 2020-06-17 NOTE — Telephone Encounter (Signed)
Correct, he needs an additional 12.5mg  hctz, so he be getting a total of 37.5 mg a day between all of his medications. Thanks.

## 2020-06-17 NOTE — Telephone Encounter (Signed)
-----   Message from Birdie Sons, MD sent at 06/17/2020  7:12 AM EDT ----- Labs are good. Need to ADD hctz 12.5 mg once tablet daily to his current blood pressure medications. #30, rf x 1. Follow up in 4-6 weeks to check blood pressure.

## 2020-06-18 ENCOUNTER — Telehealth: Payer: Self-pay

## 2020-06-18 NOTE — Telephone Encounter (Signed)
-----   Message from Birdie Sons, MD sent at 06/17/2020  7:12 AM EDT ----- Labs are good. Need to ADD hctz 12.5 mg once tablet daily to his current blood pressure medications. #30, rf x 1. Follow up in 4-6 weeks to check blood pressure.

## 2020-06-18 NOTE — Telephone Encounter (Signed)
Patients wife Christophr Calix advised of results and verbalized understanding (ok per DPR). Patient wasn't home at the time of my call. Follow up appointment has already been scheduled for 08/03/2020 at 8am.

## 2020-07-20 ENCOUNTER — Other Ambulatory Visit: Payer: Self-pay | Admitting: Family Medicine

## 2020-07-20 DIAGNOSIS — I10 Essential (primary) hypertension: Secondary | ICD-10-CM

## 2020-08-03 ENCOUNTER — Other Ambulatory Visit: Payer: Self-pay

## 2020-08-03 ENCOUNTER — Ambulatory Visit (INDEPENDENT_AMBULATORY_CARE_PROVIDER_SITE_OTHER): Payer: Medicare HMO | Admitting: Family Medicine

## 2020-08-03 ENCOUNTER — Encounter: Payer: Self-pay | Admitting: Family Medicine

## 2020-08-03 VITALS — BP 143/79 | HR 55 | Temp 98.4°F | Resp 18 | Wt 176.4 lb

## 2020-08-03 DIAGNOSIS — I10 Essential (primary) hypertension: Secondary | ICD-10-CM | POA: Diagnosis not present

## 2020-08-03 DIAGNOSIS — R61 Generalized hyperhidrosis: Secondary | ICD-10-CM

## 2020-08-03 NOTE — Progress Notes (Signed)
Established patient visit   Patient: Dennis Cooper   DOB: 06/29/1932   87 y.o. Male  MRN: [id number] Visit Date: 08/03/2020  Today's healthcare provider: Lelon Huh, MD   Chief Complaint  Patient presents with  . Hypertension   Subjective    HPI  Hypertension, follow-up  BP Readings from Last 3 Encounters:  08/03/20 (!) 143/79  06/16/20 (!) 155/78  06/16/20 132/68   Wt Readings from Last 3 Encounters:  08/03/20 176 lb 6.4 oz (80 kg)  06/16/20 176 lb (79.8 kg)  06/16/20 176 lb (79.8 kg)     He was last seen for hypertension 06/16/2020.  BP at that visit was 155/78. Management since that visit includes adding hctz 12.5 mg one tablet daily to his current blood pressure medications.Patient was advised to follow up in 4-6 weeks to check blood pressure.    He reports good compliance with treatment. He is not having side effects. Although, yesterday while sitting in his recliner he states he broke out in a sweat and felt weak.  He checked his blood pressure at that time and it was 115/52. Patient denies any shortness of breath or chest pain.  He is following a Regular diet. He is exercising. He does not smoke.  Use of agents associated with hypertension: NSAIDS.   Outside blood pressures are checked occasionally. Symptoms: No chest pain No chest pressure  No palpitations No syncope  No dyspnea No orthopnea  No paroxysmal nocturnal dyspnea No lower extremity edema   Pertinent labs: Lab Results  Component Value Date   CHOL 149 06/16/2020   HDL 74 06/16/2020   LDLCALC 64 06/16/2020   TRIG 52 06/16/2020   CHOLHDL 2.0 06/16/2020   Lab Results  Component Value Date   NA 137 06/16/2020   K 4.8 06/16/2020   CREATININE 0.97 06/16/2020   GFRNONAA 76 06/10/2019   GFRAA 88 06/10/2019   GLUCOSE 91 06/16/2020     The ASCVD Risk score (Chatfield., et al., 2013) failed to calculate for the following reasons:   The 2013 ASCVD risk score is only valid for ages 67 to  84   ---------------------------------------------------------------------------------------------------     Medications: Outpatient Medications Prior to Visit  Medication Sig  . amLODipine (NORVASC) 10 MG tablet Take 1 tablet (10 mg total) by mouth daily.  Marland Kitchen aspirin EC 81 MG tablet Take 81 mg by mouth daily.  . hydrochlorothiazide (HYDRODIURIL) 12.5 MG tablet TAKE 1 TABLET (12.5 MG TOTAL) BY MOUTH DAILY. TAKE IN ADDITION TO LOSARTAN-HCTZ  . ketoconazole (NIZORAL) 2 % cream Apply 1 application topically daily. qhs to both feet and in between toes  . losartan-hydrochlorothiazide (HYZAAR) 100-25 MG tablet Take 1 tablet by mouth daily.  . Multiple Vitamins-Minerals (MULTIVITAMIN ADULT PO) Take 1 tablet by mouth daily.  . polyethylene glycol-electrolytes (NULYTELY/GOLYTELY) 420 g solution Take 4,000 mLs by mouth once.  . tadalafil (CIALIS) 5 MG tablet Take 10 mg by mouth as needed for erectile dysfunction (no more than 2 tablets in a day).   No facility-administered medications prior to visit.    Review of Systems  Constitutional: Positive for diaphoresis (yesterday ). Negative for appetite change, chills and fever.  Respiratory: Negative for chest tightness, shortness of breath and wheezing.   Cardiovascular: Negative for chest pain and palpitations.  Gastrointestinal: Negative for abdominal pain, nausea and vomiting.  Neurological: Positive for weakness.       Objective    BP (!) 143/79 (BP Location: Right  Arm, Patient Position: Sitting, Cuff Size: Normal)   Pulse (!) 55   Temp 98.4 F (36.9 C) (Temporal)   Resp 18   Wt 176 lb 6.4 oz (80 kg)   BMI 26.05 kg/m     Physical Exam   General: Appearance:     Overweight male in no acute distress  Eyes:    PERRL, conjunctiva/corneas clear, EOM's intact       Lungs:     Clear to auscultation bilaterally, respirations unlabored  Heart:    Bradycardic. Normal rhythm. No murmurs, rubs, or gallops.   MS:   All extremities are  intact.   Neurologic:   Awake, alert, oriented x 3. No apparent focal neurological           defect.         Assessment & Plan     1. Primary hypertension Improved, although not to goal with additional 12.5mg  of hctz.  - CBC with Differential/Platelet - Comprehensive metabolic panel  2. Diaphoresis Single brief 3 episode yesterday evening with no associated dyspnea, chest pains, palpitations, or neurological symptoms. 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  - TSH - Troponin T            Lelon Huh, MD  [facility] [phone] (phone) [phone] (fax)  [place]

## 2020-08-04 LAB — COMPREHENSIVE METABOLIC PANEL
ALT: 22 IU/L (ref 0–44)
AST: 22 IU/L (ref 0–40)
Albumin/Globulin Ratio: 2.2 (ref 1.2–2.2)
Albumin: 4.7 g/dL — ABNORMAL HIGH (ref 3.6–4.6)
Alkaline Phosphatase: 72 IU/L (ref 44–121)
BUN/Creatinine Ratio: 22 (ref 10–24)
BUN: 21 mg/dL (ref 8–27)
Bilirubin Total: 0.6 mg/dL (ref 0.0–1.2)
CO2: 23 mmol/L (ref 20–29)
Calcium: 9.6 mg/dL (ref 8.6–10.2)
Chloride: 98 mmol/L (ref 96–106)
Creatinine, Ser: 0.94 mg/dL (ref 0.76–1.27)
Globulin, Total: 2.1 g/dL (ref 1.5–4.5)
Glucose: 95 mg/dL (ref 65–99)
Potassium: 4.2 mmol/L (ref 3.5–5.2)
Sodium: 138 mmol/L (ref 134–144)
Total Protein: 6.8 g/dL (ref 6.0–8.5)
eGFR: 79 mL/min/{1.73_m2} (ref >59)

## 2020-08-04 LAB — CBC WITH DIFFERENTIAL/PLATELET
Basophils Absolute: 0.1 10*3/uL (ref 0.0–0.2)
Basos: 1 %
EOS (ABSOLUTE): 0.2 10*3/uL (ref 0.0–0.4)
Eos: 3 %
Hematocrit: 47.3 % (ref 37.5–51.0)
Hemoglobin: 15.7 g/dL (ref 13.0–17.7)
Immature Grans (Abs): 0 10*3/uL (ref 0.0–0.1)
Immature Granulocytes: 0 %
Lymphocytes Absolute: 2 10*3/uL (ref 0.7–3.1)
Lymphs: 27 %
MCH: 30.2 pg (ref 26.6–33.0)
MCHC: 33.2 g/dL (ref 31.5–35.7)
MCV: 91 fL (ref 79–97)
Monocytes Absolute: 0.6 10*3/uL (ref 0.1–0.9)
Monocytes: 8 %
Neutrophils Absolute: 4.7 10*3/uL (ref 1.4–7.0)
Neutrophils: 61 %
Platelets: 176 10*3/uL (ref 150–450)
RBC: 5.2 x10E6/uL (ref 4.14–5.80)
RDW: 12.5 % (ref 11.6–15.4)
WBC: 7.6 10*3/uL (ref 3.4–10.8)

## 2020-08-04 LAB — TSH: TSH: 1.96 u[IU]/mL (ref 0.450–4.500)

## 2020-08-04 LAB — TROPONIN T: Troponin T (Highly Sensitive): 13 ng/L (ref 0–22)

## 2020-08-13 ENCOUNTER — Other Ambulatory Visit: Payer: Self-pay | Admitting: Family Medicine

## 2020-08-13 DIAGNOSIS — I10 Essential (primary) hypertension: Secondary | ICD-10-CM

## 2020-08-13 NOTE — Telephone Encounter (Signed)
Requested Prescriptions  Pending Prescriptions Disp Refills  . hydrochlorothiazide (HYDRODIURIL) 12.5 MG tablet [Pharmacy Med Name: HYDROCHLOROTHIAZIDE 12.5 MG TB] 90 tablet 1    Sig: TAKE 1 TABLET (12.5 MG TOTAL) BY MOUTH DAILY. TAKE IN ADDITION TO LOSARTAN-HCTZ     Cardiovascular: Diuretics - Thiazide Failed - 08/13/2020  8:31 AM      Failed - Last BP in normal range    BP Readings from Last 1 Encounters:  08/03/20 (!) 143/79         Passed - Ca in normal range and within 360 days    Calcium  Date Value Ref Range Status  08/03/2020 9.6 8.6 - 10.2 mg/dL Final         Passed - Cr in normal range and within 360 days    Creatinine, Ser  Date Value Ref Range Status  08/03/2020 0.94 0.76 - 1.27 mg/dL Final         Passed - K in normal range and within 360 days    Potassium  Date Value Ref Range Status  08/03/2020 4.2 3.5 - 5.2 mmol/L Final         Passed - Na in normal range and within 360 days    Sodium  Date Value Ref Range Status  08/03/2020 138 134 - 144 mmol/L Final         Passed - Valid encounter within last 6 months    Recent Outpatient Visits          1 week ago Primary hypertension   [facility] Birdie Sons, MD   1 month ago Annual physical exam   [facility] Birdie Sons, MD   6 months ago Essential hypertension   [facility] Birdie Sons, MD   11 months ago Essential hypertension   [facility] Birdie Sons, MD   1 year ago Essential hypertension   [facility] Birdie Sons, MD      Future Appointments            In 3 months Ralene Bathe, MD [place]   In 10 months Fisher, Kirstie Peri, MD [facility], [place]

## 2020-10-29 ENCOUNTER — Other Ambulatory Visit: Payer: Self-pay | Admitting: Family Medicine

## 2020-10-29 NOTE — Telephone Encounter (Signed)
Future visit in 8 months

## 2020-11-11 ENCOUNTER — Encounter: Payer: Self-pay | Admitting: Dermatology

## 2020-11-11 ENCOUNTER — Other Ambulatory Visit: Payer: Self-pay

## 2020-11-11 ENCOUNTER — Ambulatory Visit: Payer: Medicare HMO | Admitting: Dermatology

## 2020-11-11 DIAGNOSIS — L578 Other skin changes due to chronic exposure to nonionizing radiation: Secondary | ICD-10-CM

## 2020-11-11 DIAGNOSIS — B353 Tinea pedis: Secondary | ICD-10-CM

## 2020-11-11 DIAGNOSIS — B351 Tinea unguium: Secondary | ICD-10-CM | POA: Diagnosis not present

## 2020-11-11 DIAGNOSIS — D239 Other benign neoplasm of skin, unspecified: Secondary | ICD-10-CM

## 2020-11-11 DIAGNOSIS — L820 Inflamed seborrheic keratosis: Secondary | ICD-10-CM | POA: Diagnosis not present

## 2020-11-11 DIAGNOSIS — D2371 Other benign neoplasm of skin of right lower limb, including hip: Secondary | ICD-10-CM

## 2020-11-11 DIAGNOSIS — L570 Actinic keratosis: Secondary | ICD-10-CM

## 2020-11-11 DIAGNOSIS — D1723 Benign lipomatous neoplasm of skin and subcutaneous tissue of right leg: Secondary | ICD-10-CM | POA: Diagnosis not present

## 2020-11-11 MED ORDER — TERBINAFINE HCL 250 MG PO TABS: 250.0000 mg | ORAL_TABLET | Freq: Every day | ORAL | 0 refills | Status: DC

## 2020-11-11 MED ORDER — KETOCONAZOLE 2 % EX CREA: 1.0000 "application " | TOPICAL_CREAM | Freq: Every day | CUTANEOUS | 4 refills | Status: AC

## 2020-11-11 NOTE — Progress Notes (Signed)
Follow-Up Visit   Subjective  Dennis Cooper is a 87 y.o. male who presents for the following: Actinic Keratosis (Face, ears, 49mf/u), check spots (R tricep, noticed 3 wks ago/R ant thigh, just noticed), check knot (R knee, yrs), and Tinea Pedis (Bil feet, clear per pt, used Ketconazole 2%).  The following portions of the chart were reviewed this encounter and updated as appropriate:   Tobacco  Allergies  Meds  Problems  Med Hx  Surg Hx  Fam Hx     Review of Systems:  No other skin or systemic complaints except as noted in HPI or Assessment and Plan.  Objective  Well appearing patient in no apparent distress; mood and affect are within normal limits.  A focused examination was performed including face, ears, R arm, R leg, bil feet. Relevant physical exam findings are noted in the Assessment and Plan.  R of midline sup forehead x 1 Pink scaly macules   face x 8 (8) Pink scaly macules   R tricep x 1, Total = 1 Erythematous keratotic or waxy stuck-on papule or plaque.   bil feet and toenails Toenail dystrophy  R sup knee 2.5cm rubbery nodule  Right Thigh - Anterior Firm pink/brown papulenodule with dimple sign.    Assessment & Plan   Actinic Damage - chronic, secondary to cumulative UV radiation exposure/sun exposure over time - diffuse scaly erythematous macules with underlying dyspigmentation - Recommend daily broad spectrum sunscreen SPF 30+ to sun-exposed areas, reapply every 2 hours as needed.  - Recommend staying in the shade or wearing long sleeves, sun glasses (UVA+UVB protection) and wide brim hats (4-inch brim around the entire circumference of the hat). - Call for new or changing lesions.   Hypertrophic actinic keratosis R of midline sup forehead x 1  Recheck on f/u may bx if not improving/resolved  Destruction of lesion - R of midline sup forehead x 1 Complexity: simple   Destruction method: cryotherapy   Informed consent: discussed and consent  obtained   Timeout:  patient name, date of birth, surgical site, and procedure verified Lesion destroyed using liquid nitrogen: Yes   Region frozen until ice ball extended beyond lesion: Yes   Outcome: patient tolerated procedure well with no complications   Post-procedure details: wound care instructions given    AK (actinic keratosis) (8) face x 8  Destruction of lesion - face x 8 Complexity: simple   Destruction method: cryotherapy   Informed consent: discussed and consent obtained   Timeout:  patient name, date of birth, surgical site, and procedure verified Lesion destroyed using liquid nitrogen: Yes   Region frozen until ice ball extended beyond lesion: Yes   Outcome: patient tolerated procedure well with no complications   Post-procedure details: wound care instructions given    Inflamed seborrheic keratosis R tricep x 1, Total = 1  Destruction of lesion - R tricep x 1, Total = 1 Complexity: simple   Destruction method: cryotherapy   Informed consent: discussed and consent obtained   Timeout:  patient name, date of birth, surgical site, and procedure verified Lesion destroyed using liquid nitrogen: Yes   Region frozen until ice ball extended beyond lesion: Yes   Outcome: patient tolerated procedure well with no complications   Post-procedure details: wound care instructions given    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  ketoconazole (NIZORAL) 2 % cream - bil feet and toenails Apply 1 application topically at bedtime. Qhs to feet and in between toes  Lipoma versus vascular lesion with history of enlarging of right superior knee area - 2.5 cm today R sup knee Recommend pt see a general surgeon, will refer to Dr. Ollen Bowl Related Procedures Ambulatory referral to General Surgery  Dermatofibroma Right Thigh - Anterior Benign, observe.    Return in about 6 weeks (around 12/23/2020) for AK f/u recheck hypertrophic AK R of midline sup forehead, Tine Unguium f/u.  I, [name], RMA, am acting as scribe for Sarina Ser, MD . Documentation: I have reviewed the above documentation for accuracy and completeness, and I agree with the above.  Sarina Ser, MD

## 2020-11-11 NOTE — Patient Instructions (Signed)

## 2020-11-16 DIAGNOSIS — H2513 Age-related nuclear cataract, bilateral: Secondary | ICD-10-CM | POA: Diagnosis not present

## 2020-11-18 ENCOUNTER — Other Ambulatory Visit: Payer: Self-pay

## 2020-12-14 DIAGNOSIS — R2241 Localized swelling, mass and lump, right lower limb: Secondary | ICD-10-CM | POA: Diagnosis not present

## 2020-12-23 ENCOUNTER — Ambulatory Visit: Payer: Medicare HMO | Admitting: Dermatology

## 2020-12-30 ENCOUNTER — Other Ambulatory Visit: Payer: Self-pay | Admitting: General Surgery

## 2020-12-30 DIAGNOSIS — L720 Epidermal cyst: Secondary | ICD-10-CM | POA: Diagnosis not present

## 2020-12-31 LAB — SURGICAL PATHOLOGY

## 2021-01-06 ENCOUNTER — Ambulatory Visit: Payer: Medicare HMO | Admitting: Dermatology

## 2021-01-06 ENCOUNTER — Encounter: Payer: Self-pay | Admitting: Dermatology

## 2021-01-06 ENCOUNTER — Other Ambulatory Visit: Payer: Self-pay

## 2021-01-06 DIAGNOSIS — L578 Other skin changes due to chronic exposure to nonionizing radiation: Secondary | ICD-10-CM

## 2021-01-06 DIAGNOSIS — B351 Tinea unguium: Secondary | ICD-10-CM | POA: Diagnosis not present

## 2021-01-06 DIAGNOSIS — B353 Tinea pedis: Secondary | ICD-10-CM

## 2021-01-06 MED ORDER — TERBINAFINE HCL 250 MG PO TABS: 250.0000 mg | ORAL_TABLET | Freq: Every day | ORAL | 1 refills | Status: DC

## 2021-01-06 NOTE — Progress Notes (Signed)
   Follow-Up Visit   Subjective  Dennis Cooper is a 87 y.o. male who presents for the following: Actinic Keratosis (2 months f/u Aks on the face ). 2 months f/u tinea pedis/unguium on the feet and fingers treating with Ketoconazole cream and Lamisil tablet x 30 days with a good response.  The following portions of the chart were reviewed this encounter and updated as appropriate:   Tobacco  Allergies  Meds  Problems  Med Hx  Surg Hx  Fam Hx     Review of Systems:  No other skin or systemic complaints except as noted in HPI or Assessment and Plan.  Objective  Well appearing patient in no apparent distress; mood and affect are within normal limits.  A focused examination was performed including face. Relevant physical exam findings are noted in the Assessment and Plan.  feet Clearing of the toenails    Assessment & Plan  Tinea pedis of both feet and tinea unguium feet Chronic and persistent ; improving but not to goal  Cont Ketoconazole 2% cream apply to feet at bedtime  Cont Lamisil 250 mg for 2 months then stop #30 1 rf   Terbinafine Counseling  Terbinafine is an anti-fungal medicine that can be applied to the skin (over the counter) or taken by mouth (prescription) to treat fungal infections. The pill version is often used to treat fungal infections of the nails or scalp. While most people do not have any side effects from taking terbinafine pills, some possible side effects of the medicine can include taste changes, headache, loss of smell, vision changes, nausea, vomiting, or diarrhea.   Rare side effects can include irritation of the liver, allergic reaction, or decrease in blood counts (which may show up as not feeling well or developing an infection). If you are concerned about any of these side effects, please stop the medicine and call your doctor, or in the case of an emergency such as feeling very unwell, seek immediate medical care.    Related  Medications terbinafine (LAMISIL) 250 MG tablet Take 1 tablet (250 mg total) by mouth daily.  Actinic Damage - chronic, secondary to cumulative UV radiation exposure/sun exposure over time - diffuse scaly erythematous macules with underlying dyspigmentation - Recommend daily broad spectrum sunscreen SPF 30+ to sun-exposed areas, reapply every 2 hours as needed.  - Recommend staying in the shade or wearing long sleeves, sun glasses (UVA+UVB protection) and wide brim hats (4-inch brim around the entire circumference of the hat). - Call for new or changing lesions.   Return in about 6 months (around 07/07/2021) for Tinea pedis, tinea unguium .  IMarye Round, CMA, am acting as scribe for Sarina Ser, MD .  Documentation: I have reviewed the above documentation for accuracy and completeness, and I agree with the above.  Sarina Ser, MD

## 2021-01-06 NOTE — Patient Instructions (Addendum)

## 2021-01-12 ENCOUNTER — Ambulatory Visit (INDEPENDENT_AMBULATORY_CARE_PROVIDER_SITE_OTHER): Payer: Medicare HMO

## 2021-01-12 ENCOUNTER — Other Ambulatory Visit: Payer: Self-pay

## 2021-01-12 DIAGNOSIS — Z23 Encounter for immunization: Secondary | ICD-10-CM

## 2021-02-26 ENCOUNTER — Other Ambulatory Visit: Payer: Self-pay | Admitting: Family Medicine

## 2021-02-26 DIAGNOSIS — I10 Essential (primary) hypertension: Secondary | ICD-10-CM

## 2021-02-26 NOTE — Telephone Encounter (Signed)
Requested Prescriptions  Pending Prescriptions Disp Refills  . hydrochlorothiazide (HYDRODIURIL) 12.5 MG tablet [Pharmacy Med Name: HYDROCHLOROTHIAZIDE 12.5 MG TB] 90 tablet 0    Sig: TAKE 1 TABLET (12.5 MG TOTAL) BY MOUTH DAILY. TAKE IN ADDITION TO LOSARTAN-HCTZ     Cardiovascular: Diuretics - Thiazide Failed - 02/26/2021  1:31 PM      Failed - Last BP in normal range    BP Readings from Last 1 Encounters:  08/03/20 (!) 143/79         Failed - Valid encounter within last 6 months    Recent Outpatient Visits          6 months ago Primary hypertension   [facility] Birdie Sons, MD   8 months ago Annual physical exam   [facility] Birdie Sons, MD   1 year ago Essential hypertension   [facility] Birdie Sons, MD   1 year ago Essential hypertension   [facility] Birdie Sons, MD   1 year ago Essential hypertension   [facility] Birdie Sons, MD      Future Appointments            In 4 months Fisher, Kirstie Peri, MD [facility], [place]   In 4 months Ralene Bathe, MD [place] in normal range and within 360 days    Calcium  Date Value Ref Range Status  08/03/2020 9.6 8.6 - 10.2 mg/dL Final         Passed - Cr in normal range and within 360 days    Creatinine, Ser  Date Value Ref Range Status  08/03/2020 0.94 0.76 - 1.27 mg/dL Final         Passed - K in normal range and within 360 days    Potassium  Date Value Ref Range Status  08/03/2020 4.2 3.5 - 5.2 mmol/L Final         Passed - Na in normal range and within 360 days    Sodium  Date Value Ref Range Status  08/03/2020 138 134 - 144 mmol/L Final

## 2021-03-04 DIAGNOSIS — H2640 Unspecified secondary cataract: Secondary | ICD-10-CM | POA: Diagnosis not present

## 2021-03-16 DIAGNOSIS — H2511 Age-related nuclear cataract, right eye: Secondary | ICD-10-CM | POA: Diagnosis not present

## 2021-03-17 ENCOUNTER — Encounter: Payer: Self-pay | Admitting: Ophthalmology

## 2021-03-24 NOTE — Discharge Instructions (Signed)

## 2021-03-29 ENCOUNTER — Ambulatory Visit
Admission: RE | Admit: 2021-03-29 | Discharge: 2021-03-29 | Disposition: A | Discharge status: Home / Self Care | Payer: Medicare Other | Attending: Ophthalmology | Admitting: Ophthalmology

## 2021-03-29 ENCOUNTER — Encounter: Payer: Self-pay | Admitting: Ophthalmology

## 2021-03-29 ENCOUNTER — Ambulatory Visit: Payer: Medicare Other | Admitting: Anesthesiology

## 2021-03-29 ENCOUNTER — Encounter
Admission: RE | Disposition: A | Discharge status: Home / Self Care | Payer: Self-pay | Source: Home / Self Care | Attending: Ophthalmology

## 2021-03-29 ENCOUNTER — Other Ambulatory Visit: Payer: Self-pay

## 2021-03-29 DIAGNOSIS — H2511 Age-related nuclear cataract, right eye: Secondary | ICD-10-CM | POA: Insufficient documentation

## 2021-03-29 DIAGNOSIS — Z87891 Personal history of nicotine dependence: Secondary | ICD-10-CM | POA: Diagnosis not present

## 2021-03-29 HISTORY — PX: CATARACT EXTRACTION W/PHACO: SHX586

## 2021-03-29 HISTORY — DX: Left bundle-branch block, unspecified: I44.7

## 2021-03-29 HISTORY — DX: Presence of external hearing-aid: Z97.4

## 2021-03-29 HISTORY — DX: Presence of dental prosthetic device (complete) (partial): Z97.2

## 2021-03-29 SURGERY — PHACOEMULSIFICATION, CATARACT, WITH IOL INSERTION
Anesthesia: Monitor Anesthesia Care | Site: Eye | Laterality: Right

## 2021-03-29 MED ORDER — SIGHTPATH DOSE#1 BSS IO SOLN
INTRAOCULAR | Status: DC | PRN
  Administered 2021-03-29: 15 mL via INTRAOCULAR

## 2021-03-29 MED ORDER — MIDAZOLAM HCL 2 MG/2ML IJ SOLN
INTRAMUSCULAR | Status: DC | PRN
  Administered 2021-03-29: .5 mg via INTRAVENOUS

## 2021-03-29 MED ORDER — TETRACAINE HCL 0.5 % OP SOLN
1.0000 [drp] | OPHTHALMIC | Status: DC | PRN
  Administered 2021-03-29 (×3): 1 [drp] via OPHTHALMIC

## 2021-03-29 MED ORDER — MOXIFLOXACIN HCL 0.5 % OP SOLN
OPHTHALMIC | Status: DC | PRN
  Administered 2021-03-29: 0.2 mL via OPHTHALMIC

## 2021-03-29 MED ORDER — FENTANYL CITRATE (PF) 100 MCG/2ML IJ SOLN
INTRAMUSCULAR | Status: DC | PRN
  Administered 2021-03-29: 50 ug via INTRAVENOUS

## 2021-03-29 MED ORDER — ARMC OPHTHALMIC DILATING DROPS
1.0000 | OPHTHALMIC | Status: DC | PRN
Start: 2021-03-29 — End: 2021-03-29
  Administered 2021-03-29 (×3): 1 via OPHTHALMIC

## 2021-03-29 MED ORDER — SIGHTPATH DOSE#1 BSS IO SOLN
INTRAOCULAR | Status: DC | PRN
  Administered 2021-03-29: 2 mL

## 2021-03-29 MED ORDER — ACETAMINOPHEN 325 MG PO TABS: 325.0000 mg | ORAL_TABLET | ORAL | Status: DC | PRN

## 2021-03-29 MED ORDER — SIGHTPATH DOSE#1 NA CHONDROIT SULF-NA HYALURON 40-17 MG/ML IO SOLN
INTRAOCULAR | Status: DC | PRN
  Administered 2021-03-29: 1 mL via INTRAOCULAR

## 2021-03-29 MED ORDER — GLYCOPYRROLATE 0.2 MG/ML IJ SOLN
INTRAMUSCULAR | Status: DC | PRN
  Administered 2021-03-29: .2 mg via INTRAVENOUS

## 2021-03-29 MED ORDER — ONDANSETRON HCL 4 MG/2ML IJ SOLN: 4.0000 mg | Freq: Once | INTRAMUSCULAR | Status: DC | PRN

## 2021-03-29 MED ORDER — ACETAMINOPHEN 160 MG/5ML PO SOLN: 325.0000 mg | ORAL | Status: DC | PRN

## 2021-03-29 MED ORDER — SIGHTPATH DOSE#1 BSS IO SOLN
INTRAOCULAR | Status: DC | PRN
  Administered 2021-03-29: 67 mL via OPHTHALMIC

## 2021-03-29 MED ORDER — BRIMONIDINE TARTRATE-TIMOLOL 0.2-0.5 % OP SOLN
OPHTHALMIC | Status: DC | PRN
  Administered 2021-03-29: 1 [drp] via OPHTHALMIC

## 2021-03-29 SURGICAL SUPPLY — 12 items
CANNULA ANT/CHMB 27G (MISCELLANEOUS) IMPLANT
CANNULA ANT/CHMB 27GA (MISCELLANEOUS) IMPLANT
CATARACT SUITE SIGHTPATH (MISCELLANEOUS) ×2 IMPLANT
FEE CATARACT SUITE SIGHTPATH (MISCELLANEOUS) ×1 IMPLANT
GLOVE SURG ENC TEXT LTX SZ8 (GLOVE) ×2 IMPLANT
GLOVE SURG TRIUMPH 8.0 PF LTX (GLOVE) ×2 IMPLANT
LENS IOL TECNIS EYHANCE 23.0 (Intraocular Lens) ×1 IMPLANT
NDL FILTER BLUNT 18X1 1/2 (NEEDLE) ×1 IMPLANT
NEEDLE FILTER BLUNT 18X 1/2SAF (NEEDLE) ×1
NEEDLE FILTER BLUNT 18X1 1/2 (NEEDLE) ×1 IMPLANT
SYR 3ML LL SCALE MARK (SYRINGE) ×2 IMPLANT
WATER STERILE IRR 250ML POUR (IV SOLUTION) ×2 IMPLANT

## 2021-03-29 NOTE — Op Note (Signed)
PREOPERATIVE DIAGNOSIS:  Nuclear sclerotic cataract of the right eye.   POSTOPERATIVE DIAGNOSIS:  Cataract   OPERATIVE PROCEDURE:ORPROCALL@   SURGEON:  Birder Robson, MD.   ANESTHESIA:  Anesthesiologist: Veda Canning, MD CRNA: Silvana Newness, CRNA  1.      Managed anesthesia care. 2.      0.48ml of Shugarcaine was instilled in the eye following the paracentesis.   COMPLICATIONS:  None.   TECHNIQUE:   Stop and chop   DESCRIPTION OF PROCEDURE:  The patient was examined and consented in the preoperative holding area where the aforementioned topical anesthesia was applied to the right eye and then brought back to the Operating Room where the right eye was prepped and draped in the usual sterile ophthalmic fashion and a lid speculum was placed. A paracentesis was created with the side port blade and the anterior chamber was filled with viscoelastic. A near clear corneal incision was performed with the steel keratome. A continuous curvilinear capsulorrhexis was performed with a cystotome followed by the capsulorrhexis forceps. Hydrodissection and hydrodelineation were carried out with BSS on a blunt cannula. The lens was removed in a stop and chop  technique and the remaining cortical material was removed with the irrigation-aspiration handpiece. The capsular bag was inflated with viscoelastic and the Technis [id number]  lens was placed in the capsular bag without complication. The remaining viscoelastic was removed from the eye with the irrigation-aspiration handpiece. The wounds were hydrated. The anterior chamber was flushed with BSS and the eye was inflated to physiologic pressure. 0.1ml of Vigamox was placed in the anterior chamber. The wounds were found to be water tight. The eye was dressed with Combigan. The patient was given protective glasses to wear throughout the day and a shield with which to sleep tonight. The patient was also given drops with which to begin a drop regimen today and will  follow-up with me in one day. Implant Name Type Inv. Item Serial No. Manufacturer Lot No. LRB No. Used Action  LENS IOL TECNIS EYHANCE 23.0 - [id number] Intraocular Lens LENS IOL TECNIS EYHANCE 23.0 [id number] [employer]  Right 1 Implanted   Procedure(s): CATARACT EXTRACTION PHACO AND INTRAOCULAR LENS PLACEMENT (IOC) RIGHT 4.59 00:37.1 (Right)  Electronically signed: Birder Robson 03/29/2021 10:28 AM

## 2021-03-29 NOTE — Transfer of Care (Signed)
Immediate Anesthesia Transfer of Care Note  Patient: Dennis Cooper  Procedure(s) Performed: CATARACT EXTRACTION PHACO AND INTRAOCULAR LENS PLACEMENT (IOC) RIGHT 4.59 00:37.1 (Right: Eye)  Patient Location: PACU  Anesthesia Type: MAC  Level of Consciousness: awake, alert  and patient cooperative  Airway and Oxygen Therapy: Patient Spontanous Breathing and Patient connected to supplemental oxygen  Post-op Assessment: Post-op Vital signs reviewed, Patient's Cardiovascular Status Stable, Respiratory Function Stable, Patent Airway and No signs of Nausea or vomiting  Post-op Vital Signs: Reviewed and stable  Complications: No notable events documented.

## 2021-03-29 NOTE — Anesthesia Preprocedure Evaluation (Addendum)
Anesthesia Evaluation  °Patient identified by MRN, date of birth, ID band °Patient awake ° ° ° °Reviewed: °Allergy & Precautions, NPO status  ° °Airway °Mallampati: II ° °TM Distance: >3 FB ° ° ° ° Dental °  °Pulmonary °neg recent URI, former smoker,  °  °Pulmonary exam normal ° ° ° ° ° ° ° Cardiovascular °hypertension,  °Rhythm:Regular Rate:Normal ° ° °  °Neuro/Psych °  ° GI/Hepatic °negative GI ROS,   °Endo/Other  °negative endocrine ROS ° Renal/GU °  ° °  °Musculoskeletal ° ° Abdominal °  °Peds ° Hematology °  °Anesthesia Other Findings °Hx prostate cancer °Hx basal cell cancer ° Reproductive/Obstetrics ° °  ° ° ° ° ° ° ° ° ° ° ° ° ° °  °  ° ° ° ° ° ° ° ° °Anesthesia Physical ° °Anesthesia Plan ° °ASA: 2 ° °Anesthesia Plan: MAC  ° °Post-op Pain Management: Minimal or no pain anticipated  ° °Induction: Intravenous ° °PONV Risk Score and Plan: TIVA, Midazolam and Treatment may vary due to age or medical condition ° °Airway Management Planned: Natural Airway and Nasal Cannula ° °Additional Equipment:  ° °Intra-op Plan:  ° °Post-operative Plan:  ° °Informed Consent: I have reviewed the patients History and Physical, chart, labs and discussed the procedure including the risks, benefits and alternatives for the proposed anesthesia with the patient or authorized representative who has indicated his/her understanding and acceptance.  ° ° ° ° ° °Plan Discussed with: CRNA ° °Anesthesia Plan Comments:   ° ° ° ° ° ° °Anesthesia Quick Evaluation ° °

## 2021-03-29 NOTE — H&P (Signed)
[facility]   Primary Care Physician:  Birdie Sons, MD Ophthalmologist: Dr. George Ina  Pre-Procedure History & Physical: HPI:  Dennis Cooper is a 88 y.o. male here for cataract surgery.   Past Medical History:  Diagnosis Date   Cancer [facility])    prostate   Erectile dysfunction    History of basal cell cancer 12/27/2017   post neck - nodular   History of chicken pox    History of colonic polyps    History of measles as a child    Hx of basal cell carcinoma 09/06/2017   right medial upper eyelid - nodular   Hypertension    Left bundle branch block (LBBB)    Plantar fasciitis 10/19/2014   Precancerous lesion    Wears dentures    wears upper partial   Wears hearing aid in both ears    has both, only wears left    Past Surgical History:  Procedure Laterality Date   CHOLECYSTECTOMY  2010   Dr. Jamal Collin   COLONOSCOPY  04/2008   COLONOSCOPY WITH PROPOFOL N/A 10/12/2017   Procedure: COLONOSCOPY WITH PROPOFOL;  Surgeon: Manya Silvas, MD;  Location: [facility] ENDOSCOPY;  Service: Endoscopy;  Laterality: N/A;   ESOPHAGOGASTRODUODENOSCOPY     HERNIA REPAIR  2010   inguinal; Dr. Jamal Collin   stomach ulcer  1971    Prior to Admission medications   Medication Sig Start Date End Date Taking? Authorizing Provider  amLODipine (NORVASC) 10 MG tablet Take 1 tablet (10 mg total) by mouth daily. 05/25/20  Yes Birdie Sons, MD  aspirin EC 81 MG tablet Take 81 mg by mouth daily.   Yes [provider]  hydrochlorothiazide (HYDRODIURIL) 12.5 MG tablet TAKE 1 TABLET (12.5 MG TOTAL) BY MOUTH DAILY. TAKE IN ADDITION TO LOSARTAN-HCTZ 02/26/21  Yes Birdie Sons, MD  losartan-hydrochlorothiazide [facility]) 100-25 MG tablet TAKE 1 TABLET EVERY DAY 10/29/20  Yes Birdie Sons, MD  Multiple Vitamins-Minerals (MULTIVITAMIN ADULT PO) Take 1 tablet by mouth daily.   Yes [provider]  polyethylene glycol-electrolytes (NULYTELY/GOLYTELY) 420 g solution Take 4,000 mLs by mouth  once. Patient not taking: Reported on 03/17/2021    [provider]  tadalafil (CIALIS) 5 MG tablet Take 10 mg by mouth as needed for erectile dysfunction (no more than 2 tablets in a day).    [provider]  terbinafine (LAMISIL) 250 MG tablet Take 1 tablet (250 mg total) by mouth daily. 01/06/21   Ralene Bathe, MD    Allergies as of 03/08/2021   (No Known Allergies)    Family History  Problem Relation Age of Onset   Diabetes Sister    Congestive Heart Failure Brother    Diabetes Brother    Diabetes Sister    Heart attack Sister    Diabetes Sister    Arthritis Sister    Hypertension Sister    Diabetes Brother    Hypertension Brother    Arthritis Brother    Cancer Brother    Heart attack Brother     Social History   Socioeconomic History   Marital status: Married    Spouse name: Not on file   Number of children: 3   Years of education: Not on file   Highest education level: Some college, no degree  Occupational History   Occupation: retired  Tobacco Use   Smoking status: Former    Types: Cigarettes    Quit date: 03/30/1978    Years since quitting:  43.0   Smokeless tobacco: Never  Vaping Use   Vaping Use: Never used  Substance and Sexual Activity   Alcohol use: Not Currently    Comment: Quit 11/06/17   Drug use: No   Sexual activity: Not on file  Other Topics Concern   Not on file  Social History Narrative   Not on file   Social Determinants of Health   Financial Resource Strain: Low Risk    Difficulty of Paying Living Expenses: Not hard at all  Food Insecurity: No Food Insecurity   Worried About Charity fundraiser in the Last Year: Never true   [place] in the Last Year: Never true  Transportation Needs: No Transportation Needs   Lack of Transportation (Medical): No   Lack of Transportation (Non-Medical): No  Physical Activity: Sufficiently Active   Days of Exercise per Week: 5 days   Minutes of Exercise per Session: 120  min  Stress: No Stress Concern Present   Feeling of Stress : Not at all  Social Connections: Moderately Isolated   Frequency of Communication with Friends and Family: More than three times a week   Frequency of Social Gatherings with Friends and Family: More than three times a week   Attends Religious Services: Never   Marine scientist or Organizations: No   Attends Music therapist: Never   Marital Status: Married  Human resources officer Violence: Not At Risk   Fear of Current or Ex-Partner: No   Emotionally Abused: No   Physically Abused: No   Sexually Abused: No    Review of Systems: See HPI, otherwise negative ROS  Physical Exam: BP (!) 143/76    Pulse (!) 50    Temp (!) 97 F (36.1 C) (Temporal)    Resp 20    Ht 5\' 9"  (1.753 m)    Wt 80.3 kg    SpO2 98%    BMI 26.14 kg/m  General:   Alert, cooperative in NAD Head:  Normocephalic and atraumatic. Respiratory:  Normal work of breathing. Cardiovascular:  RRR  Impression/Plan: Dennis Cooper is here for cataract surgery.  Risks, benefits, limitations, and alternatives regarding cataract surgery have been reviewed with the patient.  Questions have been answered.  All parties agreeable.   Birder Robson, MD  03/29/2021, 10:02 AM

## 2021-03-29 NOTE — Anesthesia Postprocedure Evaluation (Signed)
Anesthesia Post Note  Patient: Dennis Cooper  Procedure(s) Performed: CATARACT EXTRACTION PHACO AND INTRAOCULAR LENS PLACEMENT (IOC) RIGHT 4.59 00:37.1 (Right: Eye)     Patient location during evaluation: PACU Anesthesia Type: MAC Level of consciousness: awake Pain management: pain level controlled Vital Signs Assessment: post-procedure vital signs reviewed and stable Respiratory status: respiratory function stable Cardiovascular status: stable Postop Assessment: no apparent nausea or vomiting Anesthetic complications: no   No notable events documented.  Veda Canning

## 2021-03-30 ENCOUNTER — Encounter: Payer: Self-pay | Admitting: Ophthalmology

## 2021-04-07 NOTE — Discharge Instructions (Signed)

## 2021-04-12 ENCOUNTER — Ambulatory Visit: Payer: Medicare Other | Admitting: Anesthesiology

## 2021-04-12 ENCOUNTER — Ambulatory Visit
Admission: RE | Admit: 2021-04-12 | Discharge: 2021-04-12 | Disposition: A | Discharge status: Home / Self Care | Payer: Medicare Other | Attending: Ophthalmology | Admitting: Ophthalmology

## 2021-04-12 ENCOUNTER — Encounter: Payer: Self-pay | Admitting: Ophthalmology

## 2021-04-12 ENCOUNTER — Encounter
Admission: RE | Disposition: A | Discharge status: Home / Self Care | Payer: Self-pay | Source: Home / Self Care | Attending: Ophthalmology

## 2021-04-12 ENCOUNTER — Other Ambulatory Visit: Payer: Self-pay

## 2021-04-12 DIAGNOSIS — Z79899 Other long term (current) drug therapy: Secondary | ICD-10-CM | POA: Diagnosis not present

## 2021-04-12 DIAGNOSIS — Z87891 Personal history of nicotine dependence: Secondary | ICD-10-CM | POA: Insufficient documentation

## 2021-04-12 DIAGNOSIS — I10 Essential (primary) hypertension: Secondary | ICD-10-CM | POA: Insufficient documentation

## 2021-04-12 DIAGNOSIS — H2512 Age-related nuclear cataract, left eye: Secondary | ICD-10-CM | POA: Insufficient documentation

## 2021-04-12 DIAGNOSIS — I447 Left bundle-branch block, unspecified: Secondary | ICD-10-CM | POA: Diagnosis not present

## 2021-04-12 HISTORY — PX: CATARACT EXTRACTION W/PHACO: SHX586

## 2021-04-12 SURGERY — PHACOEMULSIFICATION, CATARACT, WITH IOL INSERTION
Anesthesia: Monitor Anesthesia Care | Site: Eye | Laterality: Left

## 2021-04-12 MED ORDER — SIGHTPATH DOSE#1 BSS IO SOLN
INTRAOCULAR | Status: DC | PRN
  Administered 2021-04-12: 2 mL

## 2021-04-12 MED ORDER — GLYCOPYRROLATE 0.2 MG/ML IJ SOLN
INTRAMUSCULAR | Status: DC | PRN
  Administered 2021-04-12: .1 mg via INTRAVENOUS

## 2021-04-12 MED ORDER — FENTANYL CITRATE (PF) 100 MCG/2ML IJ SOLN
INTRAMUSCULAR | Status: DC | PRN
  Administered 2021-04-12: 50 ug via INTRAVENOUS

## 2021-04-12 MED ORDER — TETRACAINE HCL 0.5 % OP SOLN
1.0000 [drp] | OPHTHALMIC | Status: DC | PRN
  Administered 2021-04-12 (×3): 1 [drp] via OPHTHALMIC

## 2021-04-12 MED ORDER — MIDAZOLAM HCL 2 MG/2ML IJ SOLN
INTRAMUSCULAR | Status: DC | PRN
  Administered 2021-04-12: 1 mg via INTRAVENOUS

## 2021-04-12 MED ORDER — MOXIFLOXACIN HCL 0.5 % OP SOLN
OPHTHALMIC | Status: DC | PRN
  Administered 2021-04-12: 0.2 mL via OPHTHALMIC

## 2021-04-12 MED ORDER — SIGHTPATH DOSE#1 NA CHONDROIT SULF-NA HYALURON 40-17 MG/ML IO SOLN
INTRAOCULAR | Status: DC | PRN
  Administered 2021-04-12: 1 mL via INTRAOCULAR

## 2021-04-12 MED ORDER — SIGHTPATH DOSE#1 BSS IO SOLN
INTRAOCULAR | Status: DC | PRN
  Administered 2021-04-12: 10:00:00 61 mL via OPHTHALMIC

## 2021-04-12 MED ORDER — SIGHTPATH DOSE#1 BSS IO SOLN
INTRAOCULAR | Status: DC | PRN
  Administered 2021-04-12: 15 mL via INTRAOCULAR

## 2021-04-12 MED ORDER — ARMC OPHTHALMIC DILATING DROPS
1.0000 "application " | OPHTHALMIC | Status: DC | PRN
  Administered 2021-04-12 (×3): 1 via OPHTHALMIC

## 2021-04-12 MED ORDER — BRIMONIDINE TARTRATE-TIMOLOL 0.2-0.5 % OP SOLN
OPHTHALMIC | Status: DC | PRN
  Administered 2021-04-12: 1 [drp] via OPHTHALMIC

## 2021-04-12 SURGICAL SUPPLY — 10 items
CATARACT SUITE SIGHTPATH (MISCELLANEOUS) ×3 IMPLANT
FEE CATARACT SUITE SIGHTPATH (MISCELLANEOUS) ×1 IMPLANT
GLOVE SURG ENC TEXT LTX SZ8 (GLOVE) ×3 IMPLANT
GLOVE SURG TRIUMPH 8.0 PF LTX (GLOVE) ×3 IMPLANT
LENS IOL TECNIS EYHANCE 23.0 (Intraocular Lens) ×2 IMPLANT
NDL FILTER BLUNT 18X1 1/2 (NEEDLE) ×1 IMPLANT
NEEDLE FILTER BLUNT 18X 1/2SAF (NEEDLE) ×2
NEEDLE FILTER BLUNT 18X1 1/2 (NEEDLE) ×1 IMPLANT
SYR 3ML LL SCALE MARK (SYRINGE) ×3 IMPLANT
WATER STERILE IRR 250ML POUR (IV SOLUTION) ×3 IMPLANT

## 2021-04-12 NOTE — Transfer of Care (Signed)
Immediate Anesthesia Transfer of Care Note  Patient: Dennis Cooper  Procedure(s) Performed: CATARACT EXTRACTION PHACO AND INTRAOCULAR LENS PLACEMENT (IOC) LEFT 8.21 00:51.0 (Left: Eye)  Patient Location: PACU  Anesthesia Type: MAC  Level of Consciousness: awake, alert  and patient cooperative  Airway and Oxygen Therapy: Patient Spontanous Breathing and Patient connected to supplemental oxygen  Post-op Assessment: Post-op Vital signs reviewed, Patient's Cardiovascular Status Stable, Respiratory Function Stable, Patent Airway and No signs of Nausea or vomiting  Post-op Vital Signs: Reviewed and stable  Complications: No notable events documented.

## 2021-04-12 NOTE — Anesthesia Procedure Notes (Signed)
Procedure Name: MAC Date/Time: 04/12/2021 10:07 AM Performed by: Cameron Ali, CRNA Pre-anesthesia Checklist: Patient identified, Emergency Drugs available, Suction available, Timeout performed and Patient being monitored Patient Re-evaluated:Patient Re-evaluated prior to induction Oxygen Delivery Method: Nasal cannula Placement Confirmation: positive ETCO2

## 2021-04-12 NOTE — H&P (Signed)
[facility]   Primary Care Physician:  Birdie Sons, MD Ophthalmologist: Dr. George Ina  Pre-Procedure History & Physical: HPI:  Dennis Cooper is a 88 y.o. male here for cataract surgery.   Past Medical History:  Diagnosis Date   Cancer [facility])    prostate   Erectile dysfunction    History of basal cell cancer 12/27/2017   post neck - nodular   History of chicken pox    History of colonic polyps    History of measles as a child    Hx of basal cell carcinoma 09/06/2017   right medial upper eyelid - nodular   Hypertension    Left bundle branch block (LBBB)    Plantar fasciitis 10/19/2014   Precancerous lesion    Wears dentures    wears upper partial   Wears hearing aid in both ears    has both, only wears left    Past Surgical History:  Procedure Laterality Date   CATARACT EXTRACTION W/PHACO Right 03/29/2021   Procedure: CATARACT EXTRACTION PHACO AND INTRAOCULAR LENS PLACEMENT (IOC) RIGHT 4.59 00:37.1;  Surgeon: Birder Robson, MD;  Location: [facility];  Service: Ophthalmology;  Laterality: Right;   CHOLECYSTECTOMY  2010   Dr. Jamal Collin   COLONOSCOPY  04/2008   COLONOSCOPY WITH PROPOFOL N/A 10/12/2017   Procedure: COLONOSCOPY WITH PROPOFOL;  Surgeon: Manya Silvas, MD;  Location: [facility] ENDOSCOPY;  Service: Endoscopy;  Laterality: N/A;   ESOPHAGOGASTRODUODENOSCOPY     HERNIA REPAIR  2010   inguinal; Dr. Jamal Collin   stomach ulcer  1971    Prior to Admission medications   Medication Sig Start Date End Date Taking? Authorizing Provider  amLODipine (NORVASC) 10 MG tablet Take 1 tablet (10 mg total) by mouth daily. 05/25/20  Yes Birdie Sons, MD  aspirin EC 81 MG tablet Take 81 mg by mouth daily.   Yes [provider]  hydrochlorothiazide (HYDRODIURIL) 12.5 MG tablet TAKE 1 TABLET (12.5 MG TOTAL) BY MOUTH DAILY. TAKE IN ADDITION TO LOSARTAN-HCTZ 02/26/21  Yes Birdie Sons, MD  losartan-hydrochlorothiazide [facility]) 100-25 MG tablet TAKE 1  TABLET EVERY DAY 10/29/20  Yes Birdie Sons, MD  Multiple Vitamins-Minerals (MULTIVITAMIN ADULT PO) Take 1 tablet by mouth daily.   Yes [provider]  polyethylene glycol-electrolytes (NULYTELY/GOLYTELY) 420 g solution Take 4,000 mLs by mouth once.   Yes [provider]  tadalafil (CIALIS) 5 MG tablet Take 10 mg by mouth as needed for erectile dysfunction (no more than 2 tablets in a day).   Yes [provider]  terbinafine (LAMISIL) 250 MG tablet Take 1 tablet (250 mg total) by mouth daily. 01/06/21  Yes Ralene Bathe, MD    Allergies as of 03/08/2021   (No Known Allergies)    Family History  Problem Relation Age of Onset   Diabetes Sister    Congestive Heart Failure Brother    Diabetes Brother    Diabetes Sister    Heart attack Sister    Diabetes Sister    Arthritis Sister    Hypertension Sister    Diabetes Brother    Hypertension Brother    Arthritis Brother    Cancer Brother    Heart attack Brother     Social History   Socioeconomic History   Marital status: Married    Spouse name: Not on file   Number of children: 3   Years of education: Not on file   Highest education level: Some college, no degree  Occupational History   Occupation: retired  Tobacco Use   Smoking status: Former    Types: Cigarettes    Quit date: 03/30/1978    Years since quitting: 43.0   Smokeless tobacco: Never  Vaping Use   Vaping Use: Never used  Substance and Sexual Activity   Alcohol use: Not Currently    Comment: Quit 11/06/17   Drug use: No   Sexual activity: Not on file  Other Topics Concern   Not on file  Social History Narrative   Not on file   Social Determinants of Health   Financial Resource Strain: Low Risk    Difficulty of Paying Living Expenses: Not hard at all  Food Insecurity: No Food Insecurity   Worried About Charity fundraiser in the Last Year: Never true   [place] in the Last Year: Never true  Transportation Needs:  No Transportation Needs   Lack of Transportation (Medical): No   Lack of Transportation (Non-Medical): No  Physical Activity: Sufficiently Active   Days of Exercise per Week: 5 days   Minutes of Exercise per Session: 120 min  Stress: No Stress Concern Present   Feeling of Stress : Not at all  Social Connections: Moderately Isolated   Frequency of Communication with Friends and Family: More than three times a week   Frequency of Social Gatherings with Friends and Family: More than three times a week   Attends Religious Services: Never   Marine scientist or Organizations: No   Attends Music therapist: Never   Marital Status: Married  Human resources officer Violence: Not At Risk   Fear of Current or Ex-Partner: No   Emotionally Abused: No   Physically Abused: No   Sexually Abused: No    Review of Systems: See HPI, otherwise negative ROS  Physical Exam: BP 134/80    Pulse 67    Temp (!) 96.8 F (36 C) (Temporal)    Resp 18    Ht 5\' 9"  (1.753 m)    Wt 79.4 kg    SpO2 97%    BMI 25.84 kg/m  General:   Alert, cooperative in NAD Head:  Normocephalic and atraumatic. Respiratory:  Normal work of breathing. Cardiovascular:  RRR  Impression/Plan: Dennis Cooper is here for cataract surgery.  Risks, benefits, limitations, and alternatives regarding cataract surgery have been reviewed with the patient.  Questions have been answered.  All parties agreeable.   Birder Robson, MD  04/12/2021, 9:56 AM

## 2021-04-12 NOTE — Anesthesia Postprocedure Evaluation (Signed)
Anesthesia Post Note  Patient: Dennis Cooper  Procedure(s) Performed: CATARACT EXTRACTION PHACO AND INTRAOCULAR LENS PLACEMENT (IOC) LEFT 8.21 00:51.0 (Left: Eye)     Patient location during evaluation: PACU Anesthesia Type: MAC Level of consciousness: awake Pain management: pain level controlled Vital Signs Assessment: post-procedure vital signs reviewed and stable Respiratory status: respiratory function stable Cardiovascular status: stable Postop Assessment: no apparent nausea or vomiting Anesthetic complications: no   No notable events documented.  Veda Canning

## 2021-04-12 NOTE — Anesthesia Preprocedure Evaluation (Signed)
Anesthesia Evaluation  Patient identified by MRN, date of birth, ID band Patient awake    Reviewed: Allergy & Precautions, NPO status   Airway Mallampati: II  TM Distance: >3 FB     Dental   Pulmonary neg recent URI, former smoker,    Pulmonary exam normal        Cardiovascular hypertension,  Rhythm:Regular Rate:Normal     Neuro/Psych    GI/Hepatic negative GI ROS,   Endo/Other  negative endocrine ROS  Renal/GU      Musculoskeletal   Abdominal   Peds  Hematology   Anesthesia Other Findings Hx prostate cancer Hx basal cell cancer  Reproductive/Obstetrics                             Anesthesia Physical  Anesthesia Plan  ASA: 2  Anesthesia Plan: MAC   Post-op Pain Management: Minimal or no pain anticipated   Induction: Intravenous  PONV Risk Score and Plan: TIVA, Midazolam and Treatment may vary due to age or medical condition  Airway Management Planned: Natural Airway and Nasal Cannula  Additional Equipment:   Intra-op Plan:   Post-operative Plan:   Informed Consent: I have reviewed the patients History and Physical, chart, labs and discussed the procedure including the risks, benefits and alternatives for the proposed anesthesia with the patient or authorized representative who has indicated his/her understanding and acceptance.       Plan Discussed with: CRNA  Anesthesia Plan Comments:         Anesthesia Quick Evaluation

## 2021-04-12 NOTE — Op Note (Signed)
PREOPERATIVE DIAGNOSIS:  Nuclear sclerotic cataract of the left eye.   POSTOPERATIVE DIAGNOSIS:  Nuclear sclerotic cataract of the left eye.   OPERATIVE PROCEDURE:ORPROCALL@   SURGEON:  Birder Robson, MD.   ANESTHESIA:  Anesthesiologist: Veda Canning, MD CRNA: Cameron Ali, CRNA  1.      Managed anesthesia care. 2.     0.80ml of Shugarcaine was instilled following the paracentesis   COMPLICATIONS:  None.   TECHNIQUE:   Stop and chop   DESCRIPTION OF PROCEDURE:  The patient was examined and consented in the preoperative holding area where the aforementioned topical anesthesia was applied to the left eye and then brought back to the Operating Room where the left eye was prepped and draped in the usual sterile ophthalmic fashion and a lid speculum was placed. A paracentesis was created with the side port blade and the anterior chamber was filled with viscoelastic. A near clear corneal incision was performed with the steel keratome. A continuous curvilinear capsulorrhexis was performed with a cystotome followed by the capsulorrhexis forceps. Hydrodissection and hydrodelineation were carried out with BSS on a blunt cannula. The lens was removed in a stop and chop  technique and the remaining cortical material was removed with the irrigation-aspiration handpiece. The capsular bag was inflated with viscoelastic and the Technis [id number] lens was placed in the capsular bag without complication. The remaining viscoelastic was removed from the eye with the irrigation-aspiration handpiece. The wounds were hydrated. The anterior chamber was flushed with BSS and the eye was inflated to physiologic pressure. 0.56ml Vigamox was placed in the anterior chamber. The wounds were found to be water tight. The eye was dressed with Combigan. The patient was given protective glasses to wear throughout the day and a shield with which to sleep tonight. The patient was also given drops with which to begin a drop regimen  today and will follow-up with me in one day. Implant Name Type Inv. Item Serial No. Manufacturer Lot No. LRB No. Used Action  LENS IOL TECNIS EYHANCE 23.0 - [id number] Intraocular Lens LENS IOL TECNIS EYHANCE 23.0 [id number] [employer]  Left 1 Implanted    Procedure(s): CATARACT EXTRACTION PHACO AND INTRAOCULAR LENS PLACEMENT (IOC) LEFT 8.21 00:51.0 (Left)  Electronically signed: Birder Robson 04/12/2021 10:18 AM

## 2021-04-13 ENCOUNTER — Encounter: Payer: Self-pay | Admitting: Ophthalmology

## 2021-05-10 ENCOUNTER — Telehealth: Payer: Self-pay | Admitting: Family Medicine

## 2021-05-10 DIAGNOSIS — I10 Essential (primary) hypertension: Secondary | ICD-10-CM

## 2021-05-10 MED ORDER — LOSARTAN POTASSIUM-HCTZ 100-25 MG PO TABS: 1.0000 | ORAL_TABLET | Freq: Every day | ORAL | 2 refills | Status: DC

## 2021-05-10 MED ORDER — AMLODIPINE BESYLATE 10 MG PO TABS: 10.0000 mg | ORAL_TABLET | Freq: Every day | ORAL | 4 refills | Status: DC

## 2021-05-10 NOTE — Telephone Encounter (Signed)
LOV: 08/03/2020 NOV:06/28/2021  Ok to fill prescription?

## 2021-05-10 NOTE — Telephone Encounter (Signed)
[facility] faxed refill request for the following medications:  amLODipine (NORVASC) 10 MG tablet   losartan-hydrochlorothiazide (HYZAAR) 100-25 MG tablet   Please advise.

## 2021-05-12 MED ORDER — AMLODIPINE BESYLATE 10 MG PO TABS: 10.0000 mg | ORAL_TABLET | Freq: Every day | ORAL | 4 refills | Status: DC

## 2021-05-12 MED ORDER — LOSARTAN POTASSIUM-HCTZ 100-25 MG PO TABS: 1.0000 | ORAL_TABLET | Freq: Every day | ORAL | 2 refills | Status: DC

## 2021-05-12 NOTE — Addendum Note (Signed)
Addended by: Randal Buba on: 05/12/2021 03:25 PM   Modules accepted: Orders

## 2021-05-12 NOTE — Telephone Encounter (Signed)
Received another fax requesting these medications.  Looks like they were sent to [place] and need to be sent to [facility].  Please advise

## 2021-05-16 MED ORDER — LOSARTAN POTASSIUM-HCTZ 100-25 MG PO TABS: 1.0000 | ORAL_TABLET | Freq: Every day | ORAL | 2 refills | Status: DC

## 2021-05-16 MED ORDER — AMLODIPINE BESYLATE 10 MG PO TABS: 10.0000 mg | ORAL_TABLET | Freq: Every day | ORAL | 4 refills | Status: DC

## 2021-05-16 NOTE — Addendum Note (Signed)
Addended by: Birdie Sons on: 05/16/2021 02:41 PM   Modules accepted: Orders

## 2021-05-26 ENCOUNTER — Other Ambulatory Visit: Payer: Self-pay | Admitting: Family Medicine

## 2021-05-26 DIAGNOSIS — I10 Essential (primary) hypertension: Secondary | ICD-10-CM

## 2021-06-28 ENCOUNTER — Encounter: Payer: Medicare HMO | Admitting: Family Medicine

## 2021-06-28 ENCOUNTER — Ambulatory Visit (INDEPENDENT_AMBULATORY_CARE_PROVIDER_SITE_OTHER): Payer: Medicare Other

## 2021-06-28 VITALS — BP 118/70 | HR 87 | Temp 98.0°F | Ht 69.0 in | Wt 180.1 lb

## 2021-06-28 DIAGNOSIS — Z0000 Encounter for general adult medical examination without abnormal findings: Secondary | ICD-10-CM

## 2021-06-28 NOTE — Progress Notes (Signed)
? ?Subjective:  ? Dennis Cooper is a 88 y.o. male who presents for Medicare Annual/Subsequent preventive examination. ? ?Review of Systems    ? ?Cardiac Risk Factors include: advanced age (>54mn, >>51women);male gender;hypertension ? ?   ?Objective:  ?  ?Today's Vitals  ? 06/28/21 0821  ?BP: 118/70  ?Pulse: 87  ?Temp: 98 ?F (36.7 ?C)  ?TempSrc: Skin  ?SpO2: 100%  ?Weight: 180 lb 1.6 oz (81.7 kg)  ?Height: '5\' 9"'$  (1.753 m)  ? ?Body mass index is 26.6 kg/m?. ? ? ?  06/28/2021  ?  8:30 AM 06/16/2020  ?  8:34 AM 06/10/2019  ?  9:37 AM 05/27/2018  ? 11:12 AM 10/12/2017  ?  9:30 AM 05/23/2017  ?  8:47 AM  ?Advanced Directives  ?Does Patient Have a Medical Advance Directive? Yes Yes Yes Yes Yes Yes  ?Type of AParamedicof [place]St. [place]Living will [place]Living will [place]Living will [place]Living will [place]Living will [place]Living will  ?Does patient want to make changes to medical advance directive? Yes (Inpatient - patient defers changing a medical advance directive and declines information at this time)       ?Copy of [place]in Chart? Yes - validated most recent copy scanned in chart (See row information) Yes - validated most recent copy scanned in chart (See row information) Yes - validated most recent copy scanned in chart (See row information) No - copy requested No - copy requested No - copy requested  ? ? ?Current Medications (verified) ?Outpatient Encounter Medications as of 06/28/2021  ?Medication Sig  ? amLODipine (NORVASC) 10 MG tablet Take 1 tablet (10 mg total) by mouth daily.  ? aspirin EC 81 MG tablet Take 81 mg by mouth daily.  ? hydrochlorothiazide (HYDRODIURIL) 12.5 MG tablet TAKE 1 TABLET (12.5 MG TOTAL) BY MOUTH DAILY. TAKE IN ADDITION TO LOSARTAN-HCTZ  ? ketoconazole (NIZORAL) 2 % cream Apply 1 application. topically at bedtime.  ? ketoconazole (NIZORAL)  2 % cream as needed.  ? losartan-hydrochlorothiazide (HYZAAR) 100-25 MG tablet Take 1 tablet by mouth daily.  ? Multiple Vitamins-Minerals (MULTIVITAMIN ADULT PO) Take 1 tablet by mouth daily.  ? tadalafil (CIALIS) 5 MG tablet Take 10 mg by mouth as needed for erectile dysfunction (no more than 2 tablets in a day).  ? terbinafine (LAMISIL) 250 MG tablet Take 1 tablet (250 mg total) by mouth daily.  ? polyethylene glycol-electrolytes (NULYTELY/GOLYTELY) 420 g solution Take 4,000 mLs by mouth once. (Patient not taking: Reported on 06/28/2021)  ? ?No facility-administered encounter medications on file as of 06/28/2021.  ? ? ?Allergies (verified) ?Patient has no known allergies.  ? ?History: ?Past Medical History:  ?Diagnosis Date  ? Cancer ([facility]   ? prostate  ? Erectile dysfunction   ? History of basal cell cancer 12/27/2017  ? post neck - nodular  ? History of chicken pox   ? History of colonic polyps   ? History of measles as a child   ? Hx of basal cell carcinoma 09/06/2017  ? right medial upper eyelid - nodular  ? Hypertension   ? Left bundle branch block (LBBB)   ? Plantar fasciitis 10/19/2014  ? Precancerous lesion   ? Wears dentures   ? wears upper partial  ? Wears hearing aid in both ears   ? has both, only wears left  ? ?Past Surgical History:  ?Procedure Laterality Date  ?  CATARACT EXTRACTION W/PHACO Right 03/29/2021  ? Procedure: CATARACT EXTRACTION PHACO AND INTRAOCULAR LENS PLACEMENT (IOC) RIGHT 4.59 00:37.1;  Surgeon: Birder Robson, MD;  Location: [place];  Service: Ophthalmology;  Laterality: Right;  ? CATARACT EXTRACTION W/PHACO Left 04/12/2021  ? Procedure: CATARACT EXTRACTION PHACO AND INTRAOCULAR LENS PLACEMENT (IOC) LEFT 8.21 00:51.0;  Surgeon: Birder Robson, MD;  Location: [place];  Service: Ophthalmology;  Laterality: Left;  ? CHOLECYSTECTOMY  2010  ? Dr. Jamal Collin  ? COLONOSCOPY  04/2008  ? COLONOSCOPY WITH PROPOFOL N/A 10/12/2017  ? Procedure: COLONOSCOPY WITH PROPOFOL;   Surgeon: Manya Silvas, MD;  Location[facility] ENDOSCOPY;  Service: Endoscopy;  Laterality: N/A;  ? ESOPHAGOGASTRODUODENOSCOPY    ? HERNIA REPAIR  2010  ? inguinal; Dr. Jamal Collin  ? stomach ulcer  1971  ? ?Family History  ?Problem Relation Age of Onset  ? Diabetes Sister   ? Congestive Heart Failure Brother   ? Diabetes Brother   ? Diabetes Sister   ? Heart attack Sister   ? Diabetes Sister   ? Arthritis Sister   ? Hypertension Sister   ? Diabetes Brother   ? Hypertension Brother   ? Arthritis Brother   ? Cancer Brother   ? Heart attack Brother   ? ?Social History  ? ?Socioeconomic History  ? Marital status: Married  ?  Spouse name: Not on file  ? Number of children: 3  ? Years of education: Not on file  ? Highest education level: Some college, no degree  ?Occupational History  ? Occupation: retired  ?Tobacco Use  ? Smoking status: Former  ?  Types: Cigarettes  ?  Quit date: 03/30/1978  ?  Years since quitting: 43.2  ? Smokeless tobacco: Never  ?Vaping Use  ? Vaping Use: Never used  ?Substance and Sexual Activity  ? Alcohol use: Not Currently  ?  Comment: Quit 11/06/17  ? Drug use: No  ? Sexual activity: Not on file  ?Other Topics Concern  ? Not on file  ?Social History Narrative  ? Not on file  ? ?Social Determinants of Health  ? ?Financial Resource Strain: Low Risk   ? Difficulty of Paying Living Expenses: Not hard at all  ?Food Insecurity: No Food Insecurity  ? Worried About Charity fundraiser in the Last Year: Never true  ? Ran Out of Food in the Last Year: Never true  ?Transportation Needs: No Transportation Needs  ? Lack of Transportation (Medical): No  ? Lack of Transportation (Non-Medical): No  ?Physical Activity: Sufficiently Active  ? Days of Exercise per Week: 5 days  ? Minutes of Exercise per Session: 60 min  ?Stress: No Stress Concern Present  ? Feeling of Stress : Not at all  ?Social Connections: Unknown  ? Frequency of Communication with Friends and Family: Three times a week  ? Frequency of Social  Gatherings with Friends and Family: Once a week  ? Attends Religious Services: Not on file  ? Active Member of Clubs or Organizations: No  ? Attends Archivist Meetings: Never  ? Marital Status: Married  ? ? ?Tobacco Counseling ?Counseling given: Not Answered ? ? ?Clinical Intake: ? ?Pre-visit preparation completed: Yes ? ?Pain : No/denies pain ? ?  ? ?Nutritional Risks: None ?Diabetes: No ? ?How often do you need to have someone help you when you read instructions, pamphlets, or other written materials from your doctor or pharmacy?: 1 - Never ? ?Diabetic?no ? ?Interpreter Needed?: No ? ?Information entered  by :: Kirke Shaggy, LPN ? ? ?Activities of Daily Living ? ?  06/28/2021  ?  8:32 AM 03/29/2021  ?  9:13 AM  ?In your present state of health, do you have any difficulty performing the following activities:  ?Hearing? 0 0  ?Vision? 0 0  ?Difficulty concentrating or making decisions? 0 0  ?Walking or climbing stairs? 0 0  ?Dressing or bathing? 0 0  ?Doing errands, shopping? 0   ?Preparing Food and eating ? N   ?Using the Toilet? N   ?In the past six months, have you accidently leaked urine? N   ?Do you have problems with loss of bowel control? N   ?Managing your Medications? N   ?Managing your Finances? N   ?Housekeeping or managing your Housekeeping? N   ? ? ?Patient Care Team: ?Birdie Sons, MD as PCP - General (Family Medicine) ?Ralene Bathe, MD (Dermatology) ?Pa, [facility]) ?Arelia Sneddon, [place] (Optometry) ?Robert Bellow, MD as Consulting Physician (General Surgery) ? ?Indicate any recent Medical Services you may have received from other than [facility] providers in the past year (date may be approximate). ? ?   ?Assessment:  ? This is a routine wellness examination for [facility]. ? ?Hearing/Vision screen ?No results found. ? ?Dietary issues and exercise activities discussed: ?Current Exercise Habits: Home exercise routine, Type of exercise: walking, Time (Minutes): 60,  Frequency (Times/Week): 5, Weekly Exercise (Minutes/Week): 300, Intensity: Mild ? ? Goals Addressed   ? ?  ?  ?  ?  ? This Visit's Progress  ?  DIET - EAT MORE FRUITS AND VEGETABLES     ? ?  ? ?Depression Screen

## 2021-06-28 NOTE — Patient Instructions (Signed)
Dennis Cooper , ?Thank you for taking time to come for your Medicare Wellness Visit. I appreciate your ongoing commitment to your health goals. Please review the following plan we discussed and let me know if I can assist you in the future.  ? ?Screening recommendations/referrals: ?Colonoscopy: aged out ?Recommended yearly ophthalmology/optometry visit for glaucoma screening and checkup ?Recommended yearly dental visit for hygiene and checkup ? ?Vaccinations: ?Influenza vaccine: 01/12/21 ?Pneumococcal vaccine: 05/02/13 ?Tdap vaccine: 10/04/11 ?Shingles vaccine: Zostavax 10/04/11   Shingrix 05/16/20, states got 2nd shingrix shot at [facility]   ?Covid-19: 03/25/19, 04/15/19, 01/01/20 ? ?Advanced directives: yes ? ?Conditions/risks identified: none ? ?Next appointment: Follow up in one year for your annual wellness visit. - declined, wait for physical ? ?Preventive Care 86 Years and Older, Male ?Preventive care refers to lifestyle choices and visits with your health care provider that can promote health and wellness. ?What does preventive care include? ?A yearly physical exam. This is also called an annual well check. ?Dental exams once or twice a year. ?Routine eye exams. Ask your health care provider how often you should have your eyes checked. ?Personal lifestyle choices, including: ?Daily care of your teeth and gums. ?Regular physical activity. ?Eating a healthy diet. ?Avoiding tobacco and drug use. ?Limiting alcohol use. ?Practicing safe sex. ?Taking low doses of aspirin every day. ?Taking vitamin and mineral supplements as recommended by your health care provider. ?What happens during an annual well check? ?The services and screenings done by your health care provider during your annual well check will depend on your age, overall health, lifestyle risk factors, and family history of disease. ?Counseling  ?Your health care provider may ask you questions about your: ?Alcohol use. ?Tobacco use. ?Drug use. ?Emotional  well-being. ?Home and relationship well-being. ?Sexual activity. ?Eating habits. ?History of falls. ?Memory and ability to understand (cognition). ?Work and work Statistician. ?Screening  ?You may have the following tests or measurements: ?Height, weight, and BMI. ?Blood pressure. ?Lipid and cholesterol levels. These may be checked every 5 years, or more frequently if you are over 55 years old. ?Skin check. ?Lung cancer screening. You may have this screening every year starting at age 53 if you have a 30-pack-year history of smoking and currently smoke or have quit within the past 15 years. ?Fecal occult blood test (FOBT) of the stool. You may have this test every year starting at age 42. ?Flexible sigmoidoscopy or colonoscopy. You may have a sigmoidoscopy every 5 years or a colonoscopy every 10 years starting at age 50. ?Prostate cancer screening. Recommendations will vary depending on your family history and other risks. ?Hepatitis C blood test. ?Hepatitis B blood test. ?Sexually transmitted disease (STD) testing. ?Diabetes screening. This is done by checking your blood sugar (glucose) after you have not eaten for a while (fasting). You may have this done every 1-3 years. ?Abdominal aortic aneurysm (AAA) screening. You may need this if you are a current or former smoker. ?Osteoporosis. You may be screened starting at age 27 if you are at high risk. ?Talk with your health care provider about your test results, treatment options, and if necessary, the need for more tests. ?Vaccines  ?Your health care provider may recommend certain vaccines, such as: ?Influenza vaccine. This is recommended every year. ?Tetanus, diphtheria, and acellular pertussis (Tdap, Td) vaccine. You may need a Td booster every 10 years. ?Zoster vaccine. You may need this after age 85. ?Pneumococcal 13-valent conjugate (PCV13) vaccine. One dose is recommended after age 54. ?Pneumococcal polysaccharide (PPSV23) vaccine.  One dose is recommended after  age 73. ?Talk to your health care provider about which screenings and vaccines you need and how often you need them. ?This information is not intended to replace advice given to you by your health care provider. Make sure you discuss any questions you have with your health care provider. ?Document Released: 04/02/2015 Document Revised: 11/24/2015 Document Reviewed: 01/05/2015 ?Elsevier Interactive Patient Education ? 2017 [employer]. ? ?Fall Prevention in the Home ?Falls can cause injuries. They can happen to people of all ages. There are many things you can do to make your home safe and to help prevent falls. ?What can I do on the outside of my home? ?Regularly fix the edges of walkways and driveways and fix any cracks. ?Remove anything that might make you trip as you walk through a door, such as a raised step or threshold. ?Trim any bushes or trees on the path to your home. ?Use bright outdoor lighting. ?Clear any walking paths of anything that might make someone trip, such as rocks or tools. ?Regularly check to see if handrails are loose or broken. Make sure that both sides of any steps have handrails. ?Any raised decks and porches should have guardrails on the edges. ?Have any leaves, snow, or ice cleared regularly. ?Use sand or salt on walking paths during winter. ?Clean up any spills in your garage right away. This includes oil or grease spills. ?What can I do in the bathroom? ?Use night lights. ?Install grab bars by the toilet and in the tub and shower. Do not use towel bars as grab bars. ?Use non-skid mats or decals in the tub or shower. ?If you need to sit down in the shower, use a plastic, non-slip stool. ?Keep the floor dry. Clean up any water that spills on the floor as soon as it happens. ?Remove soap buildup in the tub or shower regularly. ?Attach bath mats securely with double-sided non-slip rug tape. ?Do not have throw rugs and other things on the floor that can make you trip. ?What can I do in the  bedroom? ?Use night lights. ?Make sure that you have a light by your bed that is easy to reach. ?Do not use any sheets or blankets that are too big for your bed. They should not hang down onto the floor. ?Have a firm chair that has side arms. You can use this for support while you get dressed. ?Do not have throw rugs and other things on the floor that can make you trip. ?What can I do in the kitchen? ?Clean up any spills right away. ?Avoid walking on wet floors. ?Keep items that you use a lot in easy-to-reach places. ?If you need to reach something above you, use a strong step stool that has a grab bar. ?Keep electrical cords out of the way. ?Do not use floor polish or wax that makes floors slippery. If you must use wax, use non-skid floor wax. ?Do not have throw rugs and other things on the floor that can make you trip. ?What can I do with my stairs? ?Do not leave any items on the stairs. ?Make sure that there are handrails on both sides of the stairs and use them. Fix handrails that are broken or loose. Make sure that handrails are as long as the stairways. ?Check any carpeting to make sure that it is firmly attached to the stairs. Fix any carpet that is loose or worn. ?Avoid having throw rugs at the top or bottom of  the stairs. If you do have throw rugs, attach them to the floor with carpet tape. ?Make sure that you have a light switch at the top of the stairs and the bottom of the stairs. If you do not have them, ask someone to add them for you. ?What else can I do to help prevent falls? ?Wear shoes that: ?Do not have high heels. ?Have rubber bottoms. ?Are comfortable and fit you well. ?Are closed at the toe. Do not wear sandals. ?If you use a stepladder: ?Make sure that it is fully opened. Do not climb a closed stepladder. ?Make sure that both sides of the stepladder are locked into place. ?Ask someone to hold it for you, if possible. ?Clearly mark and make sure that you can see: ?Any grab bars or  handrails. ?First and last steps. ?Where the edge of each step is. ?Use tools that help you move around (mobility aids) if they are needed. These include: ?Canes. ?Walkers. ?Scooters. ?Crutches. ?Turn on the lights when you go

## 2021-07-21 ENCOUNTER — Ambulatory Visit: Payer: Medicare Other | Admitting: Dermatology

## 2021-07-21 DIAGNOSIS — L814 Other melanin hyperpigmentation: Secondary | ICD-10-CM | POA: Diagnosis not present

## 2021-07-21 DIAGNOSIS — L570 Actinic keratosis: Secondary | ICD-10-CM | POA: Diagnosis not present

## 2021-07-21 DIAGNOSIS — B351 Tinea unguium: Secondary | ICD-10-CM

## 2021-07-21 DIAGNOSIS — L820 Inflamed seborrheic keratosis: Secondary | ICD-10-CM | POA: Diagnosis not present

## 2021-07-21 DIAGNOSIS — L578 Other skin changes due to chronic exposure to nonionizing radiation: Secondary | ICD-10-CM

## 2021-07-21 MED ORDER — TERBINAFINE HCL 250 MG PO TABS: 250.0000 mg | ORAL_TABLET | Freq: Every day | ORAL | 0 refills | Status: DC

## 2021-07-21 NOTE — Patient Instructions (Signed)

## 2021-07-21 NOTE — Progress Notes (Signed)
Follow-Up Visit   Subjective  Dennis Cooper is a 88 y.o. male who presents for the following: Tinea pedis/Unguium (Bil feet/toenails,24mf/u,  Lamisil '250mg'$  x 365mKetoconaozle 2% cr in past) and check spots (Face, 16m16mThe patient has spots, moles and lesions to be evaluated, some may be new or changing and the patient has concerns that these could be cancer.  The following portions of the chart were reviewed this encounter and updated as appropriate:   Tobacco  Allergies  Meds  Problems  Med Hx  Surg Hx  Fam Hx     Review of Systems:  No other skin or systemic complaints except as noted in HPI or Assessment and Plan.  Objective  Well appearing patient in no apparent distress; mood and affect are within normal limits.  A focused examination was performed including bil feet, toenails, arms, ears. Relevant physical exam findings are noted in the Assessment and Plan.  toenails, feet Toenail dystropy with proximal clearing  face, ears, L arm x 5 (5) Pink scaly macules  L face x 1 Stuck on waxy paps with erythema   Assessment & Plan   Actinic Damage and Lentigos - chronic, secondary to cumulative UV radiation exposure/sun exposure over time - diffuse scaly erythematous macules with underlying dyspigmentation - Recommend daily broad spectrum sunscreen SPF 30+ to sun-exposed areas, reapply every 2 hours as needed.  - Recommend staying in the shade or wearing long sleeves, sun glasses (UVA+UVB protection) and wide brim hats (4-inch brim around the entire circumference of the hat). - Call for new or changing lesions.  - Discussed the treatment option of BBL/laser.  Typically we recommend 1-3 treatment sessions about 5-8 weeks apart for best results.  The patient's condition may require "maintenance treatments" in the future.  The fee for BBL / laser treatments is $350 per treatment session for the whole face.  A fee can be quoted for other parts of the body. Insurance typically  does not pay for BBL/laser treatments and therefore the fee is an out-of-pocket cost.  Tinea unguium toenails, feet Chronic and persistent condition with duration or expected duration over one year. Condition is symptomatic / bothersome to patient. Not to goal.  Recommend 1 more month of Lamisil '250mg'$   1 po qd #30, 0rf, for a total of 4 months of treatment  Related Medications terbinafine (LAMISIL) 250 MG tablet Take 1 tablet (250 mg total) by mouth daily.  AK (actinic keratosis) (5) face, ears, L arm x 5 Destruction of lesion - face, ears, L arm x 5 Complexity: simple   Destruction method: cryotherapy   Informed consent: discussed and consent obtained   Timeout:  patient name, date of birth, surgical site, and procedure verified Lesion destroyed using liquid nitrogen: Yes   Region frozen until ice ball extended beyond lesion: Yes   Outcome: patient tolerated procedure well with no complications   Post-procedure details: wound care instructions given    Inflamed seborrheic keratosis L face x 1 Destruction of lesion - L face x 1 Complexity: simple   Destruction method: cryotherapy   Informed consent: discussed and consent obtained   Timeout:  patient name, date of birth, surgical site, and procedure verified Lesion destroyed using liquid nitrogen: Yes   Region frozen until ice ball extended beyond lesion: Yes   Outcome: patient tolerated procedure well with no complications   Post-procedure details: wound care instructions given    Return in about 6 months (around 01/21/2022) for TBSE, Hx of  AKs, Hx of BCC.  I, [name], RMA, am acting as scribe for Sarina Ser, MD . Documentation: I have reviewed the above documentation for accuracy and completeness, and I agree with the above.  Sarina Ser, MD

## 2021-08-08 ENCOUNTER — Encounter: Payer: Self-pay | Admitting: Dermatology

## 2021-11-08 ENCOUNTER — Ambulatory Visit (INDEPENDENT_AMBULATORY_CARE_PROVIDER_SITE_OTHER): Payer: Medicare Other | Admitting: Family Medicine

## 2021-11-08 ENCOUNTER — Encounter: Payer: Self-pay | Admitting: Family Medicine

## 2021-11-08 VITALS — BP 106/58 | HR 61 | Temp 97.4°F | Resp 12 | Ht 69.0 in | Wt 177.0 lb

## 2021-11-08 DIAGNOSIS — Z8249 Family history of ischemic heart disease and other diseases of the circulatory system: Secondary | ICD-10-CM | POA: Diagnosis not present

## 2021-11-08 DIAGNOSIS — I10 Essential (primary) hypertension: Secondary | ICD-10-CM

## 2021-11-08 DIAGNOSIS — Z0000 Encounter for general adult medical examination without abnormal findings: Secondary | ICD-10-CM | POA: Diagnosis not present

## 2021-11-08 DIAGNOSIS — M7071 Other bursitis of hip, right hip: Secondary | ICD-10-CM | POA: Diagnosis not present

## 2021-11-08 MED ORDER — AMLODIPINE BESYLATE 5 MG PO TABS: 5.0000 mg | ORAL_TABLET | Freq: Every day | ORAL | 1 refills | Status: DC

## 2021-11-08 NOTE — Progress Notes (Signed)
I,Roshena L Chambers,acting as a scribe for Lelon Huh, MD.,have documented all relevant documentation on the behalf of Lelon Huh, MD,as directed by  Lelon Huh, MD while in the presence of Lelon Huh, MD.    Complete physical exam   Patient: Dennis Cooper   DOB: [birth year]   88 y.o. Male  MRN: [id number] Visit Date: 11/08/2021  Today's healthcare provider: Lelon Huh, MD   Chief Complaint  Patient presents with   Annual Exam   Hypertension   Subjective    Dennis Cooper is a 88 y.o. male who presents today for a complete physical exam.  He reports consuming a general diet. Gym/ health club routine includes light weights and walking 3 days each week. He generally feels fairly well. He reports sleeping fairly well. He does not have additional problems to discuss today.  Had AWV with [facility] on 06/28/2021.  HPI  Hypertension, follow-up  BP Readings from Last 3 Encounters:  11/08/21 (!) 106/58  06/28/21 118/70  04/12/21 119/69   Wt Readings from Last 3 Encounters:  11/08/21 177 lb (80.3 kg)  06/28/21 180 lb 1.6 oz (81.7 kg)  04/12/21 175 lb (79.4 kg)     He was last seen for hypertension on 08/03/2020.  BP at that visit was 143/79. Management since that visit includes continuing same medication.  He reports good compliance with treatment. He is not having side effects.  He is following a Regular diet. He is exercising. He does not smoke.  Use of agents associated with hypertension: NSAIDS.   Outside blood pressures are not checked. Symptoms: No chest pain No chest pressure  No palpitations No syncope  No dyspnea No orthopnea  No paroxysmal nocturnal dyspnea No lower extremity edema   Pertinent labs Lab Results  Component Value Date   CHOL 149 06/16/2020   HDL 74 06/16/2020   LDLCALC 64 06/16/2020   TRIG 52 06/16/2020   CHOLHDL 2.0 06/16/2020   Lab Results  Component Value Date   NA 138 08/03/2020   K 4.2 08/03/2020   CREATININE 0.94  08/03/2020   EGFR 79 08/03/2020   GLUCOSE 95 08/03/2020   TSH 1.960 08/03/2020     The ASCVD Risk score (Arnett DK, et al., 2019) failed to calculate for the following reasons:   The 2019 ASCVD risk score is only valid for ages 55 to 63  ---------------------------------------------------------------------------------------------------   He is concerned about condition of his heart due to family history of his father and two brothers having had coronary artery disease. He also reports occasionally episodes of briefly losing balance or feeling a little dizzy when he stands up.   Past Medical History:  Diagnosis Date   Cancer [facility])    prostate   Erectile dysfunction    History of basal cell cancer 12/27/2017   post neck - nodular   History of chicken pox    History of colonic polyps    History of measles as a child    Hx of basal cell carcinoma 09/06/2017   right medial upper eyelid - nodular   Hypertension    Left bundle branch block (LBBB)    Plantar fasciitis 10/19/2014   Precancerous lesion    Wears dentures    wears upper partial   Wears hearing aid in both ears    has both, only wears left   Past Surgical History:  Procedure Laterality Date   CATARACT EXTRACTION W/PHACO Right 03/29/2021   Procedure: CATARACT EXTRACTION PHACO AND INTRAOCULAR  LENS PLACEMENT (IOC) RIGHT 4.59 00:37.1;  Surgeon: Birder Robson, MD;  Location: [facility];  Service: Ophthalmology;  Laterality: Right;   CATARACT EXTRACTION W/PHACO Left 04/12/2021   Procedure: CATARACT EXTRACTION PHACO AND INTRAOCULAR LENS PLACEMENT (IOC) LEFT 8.21 00:51.0;  Surgeon: Birder Robson, MD;  Location: [facility];  Service: Ophthalmology;  Laterality: Left;   CHOLECYSTECTOMY  2010   Dr. Jamal Collin   COLONOSCOPY  04/2008   COLONOSCOPY WITH PROPOFOL N/A 10/12/2017   Procedure: COLONOSCOPY WITH PROPOFOL;  Surgeon: Manya Silvas, MD;  Location: [facility] ENDOSCOPY;  Service: Endoscopy;  Laterality: N/A;    ESOPHAGOGASTRODUODENOSCOPY     HERNIA REPAIR  2010   inguinal; Dr. Jamal Collin   stomach ulcer  1971   Social History   Socioeconomic History   Marital status: Married    Spouse name: Not on file   Number of children: 3   Years of education: Not on file   Highest education level: Some college, no degree  Occupational History   Occupation: retired  Tobacco Use   Smoking status: Former    Types: Cigarettes    Quit date: 03/30/1978    Years since quitting: 43.6   Smokeless tobacco: Never  Vaping Use   Vaping Use: Never used  Substance and Sexual Activity   Alcohol use: Not Currently    Comment: Quit 11/06/17   Drug use: No   Sexual activity: Not on file  Other Topics Concern   Not on file  Social History Narrative   Not on file   Social Determinants of Health   Financial Resource Strain: Low Risk  (06/28/2021)   Overall Financial Resource Strain (CARDIA)    Difficulty of Paying Living Expenses: Not hard at all  Food Insecurity: No Food Insecurity (06/28/2021)   Hunger Vital Sign    Worried About Running Out of Food in the Last Year: Never true    [place] in the Last Year: Never true  Transportation Needs: No Transportation Needs (06/28/2021)   PRAPARE - Hydrologist (Medical): No    Lack of Transportation (Non-Medical): No  Physical Activity: Sufficiently Active (06/28/2021)   Exercise Vital Sign    Days of Exercise per Week: 5 days    Minutes of Exercise per Session: 60 min  Stress: No Stress Concern Present (06/28/2021)   [place]    Feeling of Stress : Not at all  Social Connections: Unknown (06/28/2021)   Social Connection and Isolation Panel [NHANES]    Frequency of Communication with Friends and Family: Three times a week    Frequency of Social Gatherings with Friends and Family: Once a week    Attends Religious Services: Not on Advertising copywriter or  Organizations: No    Attends Archivist Meetings: Never    Marital Status: Married  Human resources officer Violence: Not At Risk (06/28/2021)   Humiliation, Afraid, Rape, and Kick questionnaire    Fear of Current or Ex-Partner: No    Emotionally Abused: No    Physically Abused: No    Sexually Abused: No   Family Status  Relation Name Status   Mother  Deceased at age 9       natural causes   Father  Deceased at age 42       MI   Sister  Deceased at age 51       Fire accident  Brother  Deceased at age 22   Sister  Deceased   Sister  [place]       had a quad-bypass   Family History  Problem Relation Age of Onset   Diabetes Sister    Congestive Heart Failure Brother    Diabetes Brother    Diabetes Sister    Heart attack Sister    Diabetes Sister    Arthritis Sister    Hypertension Sister    Diabetes Brother    Hypertension Brother    Arthritis Brother    Cancer Brother    Heart attack Brother    No Known Allergies  Patient Care Team: Birdie Sons, MD as PCP - General (Family Medicine) Ralene Bathe, MD (Dermatology) [place], [place] (Optometry) [place], [place], [place] (Optometry) [place], Forest Gleason, MD as Consulting Physician (General Surgery)   Medications: Outpatient Medications Prior to Visit  Medication Sig   amLODipine (NORVASC) 10 MG tablet Take 1 tablet (10 mg total) by mouth daily.   aspirin EC 81 MG tablet Take 81 mg by mouth daily.   hydrochlorothiazide (HYDRODIURIL) 12.5 MG tablet TAKE 1 TABLET (12.5 MG TOTAL) BY MOUTH DAILY. TAKE IN ADDITION TO LOSARTAN-HCTZ   losartan-hydrochlorothiazide (HYZAAR) 100-25 MG tablet Take 1 tablet by mouth daily.   Multiple Vitamins-Minerals (MULTIVITAMIN ADULT PO) Take 1 tablet by mouth daily.   tadalafil (CIALIS) 5 MG tablet Take 10 mg by mouth as needed for erectile dysfunction (no more than 2 tablets in a day). (Patient not taking: Reported on 11/08/2021)   terbinafine (LAMISIL) 250 MG  tablet Take 1 tablet (250 mg total) by mouth daily. (Patient not taking: Reported on 11/08/2021)   [DISCONTINUED] ketoconazole (NIZORAL) 2 % cream Apply 1 application. topically at bedtime. (Patient not taking: Reported on 11/08/2021)   [DISCONTINUED] ketoconazole (NIZORAL) 2 % cream as needed. (Patient not taking: Reported on 11/08/2021)   [DISCONTINUED] polyethylene glycol-electrolytes (NULYTELY/GOLYTELY) 420 g solution Take 4,000 mLs by mouth once. (Patient not taking: Reported on 06/28/2021)   No facility-administered medications prior to visit.    Review of Systems  Constitutional:  Negative for appetite change, chills, fatigue and fever.  HENT:  Positive for hearing loss. Negative for congestion, ear pain, nosebleeds and trouble swallowing.   Eyes:  Positive for photophobia. Negative for pain and visual disturbance.  Respiratory:  Negative for cough, chest tightness and shortness of breath.   Cardiovascular:  Negative for chest pain, palpitations and leg swelling.  Gastrointestinal:  Negative for abdominal pain, blood in stool, constipation, diarrhea, nausea and vomiting.  Endocrine: Negative for polydipsia, polyphagia and polyuria.  Genitourinary:  Negative for dysuria and flank pain.  Musculoskeletal:  Positive for arthralgias. Negative for back pain, joint swelling, myalgias and neck stiffness.  Skin:  Negative for color change, rash and wound.  Neurological:  Negative for dizziness, tremors, seizures, speech difficulty, weakness, light-headedness and headaches.  Hematological:  Bruises/bleeds easily.  Psychiatric/Behavioral:  Negative for behavioral problems, confusion, decreased concentration, dysphoric mood and sleep disturbance. The patient is not nervous/anxious.   All other systems reviewed and are negative.     Objective     BP (!) 106/58 (BP Location: Left Arm, Patient Position: Sitting, Cuff Size: Normal)   Pulse 61   Temp (!) 97.4 F (36.3 C) (Oral)   Resp 12   Ht 5'  9" (1.753 m)   Wt 177 lb (80.3 kg)   SpO2 97% Comment: room air  BMI 26.14 kg/m  Physical Exam   General Appearance:    Well developed, well nourished male. Alert, cooperative, in no acute distress, appears stated age  Head:    Normocephalic, without obvious abnormality, atraumatic  Eyes:    PERRL, conjunctiva/corneas clear, EOM's intact, fundi    benign, both eyes       Ears:    Normal TM's and external ear canals, both ears  Nose:   Nares normal, septum midline, mucosa normal, no drainage   or sinus tenderness  Throat:   Lips, mucosa, and tongue normal; teeth and gums normal  Neck:   Supple, symmetrical, trachea midline, no adenopathy;       thyroid:  No enlargement/tenderness/nodules; no carotid   bruit or JVD  Back:     Symmetric, no curvature, ROM normal, no CVA tenderness  Lungs:     Clear to auscultation bilaterally, respirations unlabored  Chest wall:    No tenderness or deformity  Heart:    Normal heart rate. Normal rhythm. No murmurs, rubs, or gallops.  S1 and S2 normal  Abdomen:     Soft, non-tender, bowel sounds active all four quadrants,    no masses, no organomegaly  Genitalia:    deferred  Rectal:    deferred  Extremities:   All extremities are intact. No cyanosis or edema  Pulses:   2+ and symmetric all extremities  Skin:   Skin color, texture, turgor normal, no rashes or lesions  Lymph nodes:   Cervical, supraclavicular, and axillary nodes normal  Neurologic:   CNII-XII intact. Normal strength, sensation and reflexes      throughout     Last depression screening scores    11/08/2021    2:08 PM 06/28/2021    8:28 AM 06/16/2020    8:31 AM  PHQ 2/9 Scores  PHQ - 2 Score 0 0 0  PHQ- 9 Score 2     Last fall risk screening    11/08/2021    2:09 PM  [place] in the past year? 0  Number falls in past yr: 0  Injury with Fall? 0  Risk for fall due to : No Fall Risks  Follow up Falls evaluation completed   Last Audit-C alcohol use screening     11/08/2021    2:09 PM  Alcohol Use Disorder Test (AUDIT)  1. How often do you have a drink containing alcohol? 0  2. How many drinks containing alcohol do you have on a typical day when you are drinking? 0  3. How often do you have six or more drinks on one occasion? 0  AUDIT-C Score 0   A score of 3 or more in women, and 4 or more in men indicates increased risk for alcohol abuse, EXCEPT if all of the points are from question 1   No results found for any visits on 11/08/21.  Assessment & Plan    Routine Health Maintenance and Physical Exam  Exercise Activities and Dietary recommendations  Goals      DIET - EAT MORE FRUITS AND VEGETABLES        Immunization History  Administered Date(s) Administered   Fluad Quad(high Dose 65+) 12/25/2018, 12/18/2019, 01/12/2021   Influenza Split 01/11/2011, 12/26/2011   Influenza, High Dose Seasonal PF 12/23/2013, 02/04/2015, 12/18/2015, 12/21/2016, 01/10/2018   Influenza,inj,Quad PF,6+ Mos 12/07/2012   PFIZER(Purple Top)SARS-COV-2 Vaccination 03/25/2019, 04/15/2019, 01/01/2020   Pfizer Covid-19 Vaccine Bivalent Booster 79yr & up 12/18/2020   Pneumococcal Conjugate-13 05/02/2013   Pneumococcal Polysaccharide-23  06/22/2005   Td 03/20/2004   Tdap 10/04/2011   Zoster Recombinat (Shingrix) 05/16/2020   Zoster, Live 10/04/2011    Health Maintenance  Topic Date Due   Zoster Vaccines- Shingrix (2 of 2) 07/11/2020   COVID-19 Vaccine (5 - Pfizer risk series) 02/12/2021   TETANUS/TDAP  10/03/2021   INFLUENZA VACCINE  10/18/2021   COLONOSCOPY (Pts 45-39yr Insurance coverage will need to be confirmed)  10/13/2022   Pneumonia Vaccine 88 Years old  Completed   HPV VACCINES  Aged Out    Discussed health benefits of physical activity, and encouraged him to engage in regular exercise appropriate for his age and condition.   2. Primary hypertension He has been having some spells suggestive or orthostasis with low blood pressure in office today.   - CBC - Comprehensive metabolic panel  Will stop hctz 12.570mtabs and reduce amLODipine (NORVASC) from 10 MG to  5 MG tablet; Take 1 tablet (5 mg total) by mouth daily.  Dispense: 90 tablet; Refill: 1  Return to check BP and follow up dizziness in about 2 months.   3. Family history of coronary artery disease Patient brings up the subject of his desire for additional cardiac testing. He is excellent physical and mental condition and has much longer life expectancy than average for his age. He has had several male relatives that had Mis with no other risk factors, so he would like additional screening for heart disease. He is willing to pay out of pocket  - CT CARDIAC SCORING (SELF PAY ONLY); Future  4. Bursitis of right hip, unspecified bursa Recommend use of ice pack prn.      The entirety of the information documented in the History of Present Illness, Review of Systems and Physical Exam were personally obtained by me. Portions of this information were initially documented by the CMA and reviewed by me for thoroughness and accuracy.     DoLelon HuhMD  [facility] [phone]) [phone])  [place]

## 2021-11-08 NOTE — Patient Instructions (Signed)
Please review the attached list of medications and notify my office if there are any errors.   Please bring all of your medications to every appointment so we can make sure that our medication list is the same as yours.   Stop taking the hctz 12.'5mg'$  tablets.

## 2021-11-09 LAB — COMPREHENSIVE METABOLIC PANEL
ALT: 142 IU/L — ABNORMAL HIGH (ref 0–44)
AST: 309 IU/L — ABNORMAL HIGH (ref 0–40)
Albumin/Globulin Ratio: 2.1 (ref 1.2–2.2)
Albumin: 4.2 g/dL (ref 3.7–4.7)
Alkaline Phosphatase: 153 IU/L — ABNORMAL HIGH (ref 44–121)
BUN/Creatinine Ratio: 22 (ref 10–24)
BUN: 24 mg/dL (ref 8–27)
Bilirubin Total: 1.4 mg/dL — ABNORMAL HIGH (ref 0.0–1.2)
CO2: 22 mmol/L (ref 20–29)
Calcium: 9.3 mg/dL (ref 8.6–10.2)
Chloride: 102 mmol/L (ref 96–106)
Creatinine, Ser: 1.1 mg/dL (ref 0.76–1.27)
Globulin, Total: 2 g/dL (ref 1.5–4.5)
Glucose: 85 mg/dL (ref 70–99)
Potassium: 4.4 mmol/L (ref 3.5–5.2)
Sodium: 140 mmol/L (ref 134–144)
Total Protein: 6.2 g/dL (ref 6.0–8.5)
eGFR: 65 mL/min/{1.73_m2} (ref >59)

## 2021-11-09 LAB — CBC
Hematocrit: 44.1 % (ref 37.5–51.0)
Hemoglobin: 14.9 g/dL (ref 13.0–17.7)
MCH: 30.7 pg (ref 26.6–33.0)
MCHC: 33.8 g/dL (ref 31.5–35.7)
MCV: 91 fL (ref 79–97)
Platelets: 181 10*3/uL (ref 150–450)
RBC: 4.86 x10E6/uL (ref 4.14–5.80)
RDW: 12.6 % (ref 11.6–15.4)
WBC: 12.8 10*3/uL — ABNORMAL HIGH (ref 3.4–10.8)

## 2021-11-15 ENCOUNTER — Inpatient Hospital Stay: Admission: RE | Admit: 2021-11-15 | Payer: Medicare Other | Source: Ambulatory Visit

## 2021-11-16 ENCOUNTER — Other Ambulatory Visit: Payer: Self-pay | Admitting: *Deleted

## 2021-11-16 DIAGNOSIS — R7989 Other specified abnormal findings of blood chemistry: Secondary | ICD-10-CM

## 2021-11-17 LAB — HEPATIC FUNCTION PANEL
ALT: 31 IU/L (ref 0–44)
AST: 29 IU/L (ref 0–40)
Albumin: 4.5 g/dL (ref 3.7–4.7)
Alkaline Phosphatase: 82 IU/L (ref 44–121)
Bilirubin Total: 0.5 mg/dL (ref 0.0–1.2)
Bilirubin, Direct: 0.21 mg/dL (ref 0.00–0.40)
Total Protein: 6.4 g/dL (ref 6.0–8.5)

## 2021-12-02 ENCOUNTER — Ambulatory Visit: Payer: Self-pay

## 2021-12-02 NOTE — Telephone Encounter (Signed)
    Chief Complaint: Pt. Fell yesterday while walking. Fell on his knees. Left knee is swollen. No availability today. Asking to be worked in. Symptoms: Pain, swelling Frequency: Yesterday Pertinent Negatives: Patient denies  Disposition: '[]'$ ED /'[]'$ Urgent Care (no appt availability in office) / '[]'$ Appointment(In office/virtual)/ '[]'$  [facility]/ '[]'$ Home Care/ '[]'$ Refused Recommended Disposition /'[]'$ [place] [facility]/ '[x]'$  Follow-up with PCP Additional Notes: Please advise pt.   Answer Assessment - Initial Assessment Questions 1. MECHANISM: "How did the fall happen?"     Walking, tripped on sidewalk 2. DOMESTIC VIOLENCE AND ELDER ABUSE SCREENING: "Did you fall because someone pushed you or tried to hurt you?" If Yes, ask: "Are you safe now?"     No 3. ONSET: "When did the fall happen?" (e.g., minutes, hours, or days ago)     Yesterday 4. LOCATION: "What part of the body hit the ground?" (e.g., back, buttocks, head, hips, knees, hands, head, stomach)     Knees 5. INJURY: "Did you hurt (injure) yourself when you fell?" If Yes, ask: "What did you injure? Tell me more about this?" (e.g., body area; type of injury; pain severity)"     Knees 6. PAIN: "Is there any pain?" If Yes, ask: "How bad is the pain?" (e.g., Scale 1-10; or mild,  moderate, severe)   - NONE (0): No pain   - MILD (1-3): Doesn't interfere with normal activities    - MODERATE (4-7): Interferes with normal activities or awakens from sleep    - SEVERE (8-10): Excruciating pain, unable to do any normal activities      Now - 8-9 7. SIZE: For cuts, bruises, or swelling, ask: "How large is it?" (e.g., inches or centimeters)      Swelling 8. PREGNANCY: "Is there any chance you are pregnant?" "When was your last menstrual period?"     No 9. OTHER SYMPTOMS: "Do you have any other symptoms?" (e.g., dizziness, fever, weakness; new onset or worsening).      No 10. CAUSE: "What do you think caused the fall (or falling)?" (e.g.,  tripped, dizzy spell)       Tripped  Protocols used: Falls and [facility]

## 2021-12-02 NOTE — Telephone Encounter (Signed)
[facility] a [facility]-in Ur[facility]. Even if we had openings today we would probably end up sending him there, recommend he go ahead and go there.

## 2021-12-02 NOTE — Telephone Encounter (Signed)
Patient advised and agreeable.

## 2021-12-29 ENCOUNTER — Telehealth: Payer: Self-pay

## 2021-12-29 NOTE — Telephone Encounter (Signed)
Patient called and said he was returning call from [place].

## 2022-01-03 ENCOUNTER — Inpatient Hospital Stay: Admission: RE | Admit: 2022-01-03 | Payer: Medicare Other | Source: Ambulatory Visit

## 2022-01-05 ENCOUNTER — Ambulatory Visit
Admission: RE | Admit: 2022-01-05 | Discharge: 2022-01-05 | Disposition: A | Discharge status: Home / Self Care | Payer: Medicare Other | Source: Ambulatory Visit | Attending: Family Medicine | Admitting: Family Medicine

## 2022-01-05 DIAGNOSIS — Z8249 Family history of ischemic heart disease and other diseases of the circulatory system: Secondary | ICD-10-CM | POA: Insufficient documentation

## 2022-01-09 ENCOUNTER — Other Ambulatory Visit: Payer: Self-pay | Admitting: Family Medicine

## 2022-01-09 ENCOUNTER — Other Ambulatory Visit: Payer: Self-pay

## 2022-01-09 DIAGNOSIS — I10 Essential (primary) hypertension: Secondary | ICD-10-CM

## 2022-01-09 MED ORDER — ROSUVASTATIN CALCIUM 10 MG PO TABS: 10.0000 mg | ORAL_TABLET | Freq: Every day | ORAL | 3 refills | Status: DC

## 2022-01-10 ENCOUNTER — Ambulatory Visit (INDEPENDENT_AMBULATORY_CARE_PROVIDER_SITE_OTHER): Payer: Medicare Other | Admitting: Family Medicine

## 2022-01-10 ENCOUNTER — Encounter: Payer: Self-pay | Admitting: Family Medicine

## 2022-01-10 VITALS — BP 157/80 | HR 62 | Resp 16 | Ht 69.0 in | Wt 181.0 lb

## 2022-01-10 DIAGNOSIS — I2510 Atherosclerotic heart disease of native coronary artery without angina pectoris: Secondary | ICD-10-CM | POA: Diagnosis not present

## 2022-01-10 DIAGNOSIS — Z23 Encounter for immunization: Secondary | ICD-10-CM | POA: Diagnosis not present

## 2022-01-10 DIAGNOSIS — I10 Essential (primary) hypertension: Secondary | ICD-10-CM

## 2022-01-10 NOTE — Patient Instructions (Signed)
.   Please review the attached list of medications and notify my office if there are any errors.   . Please bring all of your medications to every appointment so we can make sure that our medication list is the same as yours.   

## 2022-01-10 NOTE — Progress Notes (Unsigned)
I,Tiffany J Bragg,acting as a scribe for Lelon Huh, MD.,have documented all relevant documentation on the behalf of Lelon Huh, MD,as directed by  Lelon Huh, MD while in the presence of Lelon Huh, MD.    Established patient visit   Patient: Dennis Cooper   DOB: [birth year]   88 y.o. Male  MRN: [id number] Visit Date: 01/10/2022  Today's healthcare provider: Lelon Huh, MD   Chief Complaint  Patient presents with   Hypertension   Subjective    HPI HPI   Patient wants to also discuss his recent CT result and being referred to cardiology.  Last edited by Smitty Knudsen, CMA on 01/10/2022 11:34 AM.      Hypertension, follow-up  BP Readings from Last 3 Encounters:  01/10/22 (!) 157/80  11/08/21 (!) 106/58  06/28/21 118/70   Wt Readings from Last 3 Encounters:  01/10/22 181 lb (82.1 kg)  11/08/21 177 lb (80.3 kg)  06/28/21 180 lb 1.6 oz (81.7 kg)     He was last seen for hypertension 2 months ago.  BP at that visit was 106/58. Management since that visit includes stop hctz 12.32m tabs and reduce amLODipine (NORVASC) from 10 MG to  5 MG tablet.  He reports excellent compliance with treatment. He is not having side effects.  He is following a Regular diet. He is exercising. He does not smoke.  Use of agents associated with hypertension: none.   Outside blood pressures are not checked. Symptoms: No chest pain No chest pressure  No palpitations No syncope  No dyspnea No orthopnea  No paroxysmal nocturnal dyspnea No lower extremity edema   Pertinent labs Lab Results  Component Value Date   CHOL 149 06/16/2020   HDL 74 06/16/2020   LDLCALC 64 06/16/2020   TRIG 52 06/16/2020   CHOLHDL 2.0 06/16/2020   Lab Results  Component Value Date   NA 140 11/08/2021   K 4.4 11/08/2021   CREATININE 1.10 11/08/2021   EGFR 65 11/08/2021   GLUCOSE 85 11/08/2021   TSH 1.960 08/03/2020     The ASCVD Risk score (Arnett DK, et al., 2019) failed to  calculate for the following reasons:   The 2019 ASCVD risk score is only valid for ages 421to 7100 ---------------------------------------------------------------------------------------------------   Medications: Outpatient Medications Prior to Visit  Medication Sig   amLODipine (NORVASC) 5 MG tablet Take 1 tablet (5 mg total) by mouth daily.   aspirin EC 81 MG tablet Take 81 mg by mouth daily.   losartan-hydrochlorothiazide (HYZAAR) 100-25 MG tablet TAKE 1 TABLET BY MOUTH DAILY   Multiple Vitamins-Minerals (MULTIVITAMIN ADULT PO) Take 1 tablet by mouth daily.   rosuvastatin (CRESTOR) 10 MG tablet Take 1 tablet (10 mg total) by mouth daily.   tadalafil (CIALIS) 5 MG tablet Take 10 mg by mouth as needed for erectile dysfunction (no more than 2 tablets in a day).   No facility-administered medications prior to visit.    Review of Systems  {Labs  Heme  Chem  Endocrine  Serology  Results Review (optional):23779}   Objective    BP (!) 157/80 (BP Location: Right Arm, Patient Position: Sitting, Cuff Size: Normal)   Pulse 62   Resp 16   Ht '5\' 9"'  (1.753 m)   Wt 181 lb (82.1 kg)   SpO2 100%   BMI 26.73 kg/m  {Show previous vital signs (optional):23777}  Physical Exam  ***  No results found for any visits on 01/10/22.  Assessment & Plan     ***  No follow-ups on file.      {provider attestation***:1}   Lelon Huh, MD  [facility] [phone] (phone) [phone] (fax)  [place]

## 2022-01-26 ENCOUNTER — Ambulatory Visit: Payer: Medicare Other | Admitting: Dermatology

## 2022-01-26 DIAGNOSIS — L719 Rosacea, unspecified: Secondary | ICD-10-CM

## 2022-01-26 DIAGNOSIS — Z85828 Personal history of other malignant neoplasm of skin: Secondary | ICD-10-CM

## 2022-01-26 DIAGNOSIS — L821 Other seborrheic keratosis: Secondary | ICD-10-CM

## 2022-01-26 DIAGNOSIS — L570 Actinic keratosis: Secondary | ICD-10-CM | POA: Diagnosis not present

## 2022-01-26 DIAGNOSIS — Z1283 Encounter for screening for malignant neoplasm of skin: Secondary | ICD-10-CM

## 2022-01-26 DIAGNOSIS — L578 Other skin changes due to chronic exposure to nonionizing radiation: Secondary | ICD-10-CM | POA: Diagnosis not present

## 2022-01-26 DIAGNOSIS — L814 Other melanin hyperpigmentation: Secondary | ICD-10-CM

## 2022-01-26 DIAGNOSIS — D692 Other nonthrombocytopenic purpura: Secondary | ICD-10-CM

## 2022-01-26 DIAGNOSIS — D229 Melanocytic nevi, unspecified: Secondary | ICD-10-CM

## 2022-01-26 NOTE — Progress Notes (Signed)
Follow-Up Visit   Subjective  Dennis Cooper is a 88 y.o. male who presents for the following: Annual Exam. The patient presents for Upper Body Skin Exam (UBSE) for skin cancer screening and mole check.  The patient has spots, moles and lesions to be evaluated, some may be new or changing and the patient has concerns that these could be cancer.   The following portions of the chart were reviewed this encounter and updated as appropriate:   Tobacco  Allergies  Meds  Problems  Med Hx  Surg Hx  Fam Hx     Review of Systems:  No other skin or systemic complaints except as noted in HPI or Assessment and Plan.  Objective  Well appearing patient in no apparent distress; mood and affect are within normal limits.  All skin waist up examined.  Left Ear x 1, right nose x 1  (2) (2) Erythematous thin papules/macules with gritty scale.   Head - Anterior (Face) Mid face erythema with telangiectasias   Assessment & Plan  AK (actinic keratosis) (2) Left Ear x 1, right nose x 1  (2)  Actinic keratoses are precancerous spots that appear secondary to cumulative UV radiation exposure/sun exposure over time. They are chronic with expected duration over 1 year. A portion of actinic keratoses will progress to squamous cell carcinoma of the skin. It is not possible to reliably predict which spots will progress to skin cancer and so treatment is recommended to prevent development of skin cancer.  Recommend daily broad spectrum sunscreen SPF 30+ to sun-exposed areas, reapply every 2 hours as needed.  Recommend staying in the shade or wearing long sleeves, sun glasses (UVA+UVB protection) and wide brim hats (4-inch brim around the entire circumference of the hat). Call for new or changing lesions.   Destruction of lesion - Left Ear x 1, right nose x 1  (2) Complexity: simple   Destruction method: cryotherapy   Informed consent: discussed and consent obtained   Timeout:  patient name, date of  birth, surgical site, and procedure verified Lesion destroyed using liquid nitrogen: Yes   Region frozen until ice ball extended beyond lesion: Yes   Outcome: patient tolerated procedure well with no complications   Post-procedure details: wound care instructions given    Rosacea Head - Anterior (Face)  Rosacea is a chronic progressive skin condition usually affecting the face of adults, causing redness and/or acne bumps. It is treatable but not curable. It sometimes affects the eyes (ocular rosacea) as well. It may respond to topical and/or systemic medication and can flare with stress, sun exposure, alcohol, exercise, topical steroids (including hydrocortisone/cortisone 10) and some foods.  Daily application of broad spectrum spf 30+ sunscreen to face is recommended to reduce flares.   Discussed the treatment option of BBL/laser.  Typically we recommend 1-3 treatment sessions about 5-8 weeks apart for best results.  The patient's condition may require "maintenance treatments" in the future.  The fee for BBL / laser treatments is $350 per treatment session for the whole face.  A fee can be quoted for other parts of the body. Insurance typically does not pay for BBL/laser treatments and therefore the fee is an out-of-pocket cost.   Lentigines - Scattered tan macules - Due to sun exposure - Benign-appearing, observe - Recommend daily broad spectrum sunscreen SPF 30+ to sun-exposed areas, reapply every 2 hours as needed. - Call for any changes  Seborrheic Keratoses - Stuck-on, waxy, tan-brown papules and/or plaques  -  Benign-appearing - Discussed benign etiology and prognosis. - Observe - Call for any changes  Melanocytic Nevi - Tan-brown and/or pink-flesh-colored symmetric macules and papules - Benign appearing on exam today - Observation - Call clinic for new or changing moles - Recommend daily use of broad spectrum spf 30+ sunscreen to sun-exposed areas.   Hemangiomas - Red  papules - Discussed benign nature - Observe - Call for any changes  Actinic Damage - Chronic condition, secondary to cumulative UV/sun exposure - diffuse scaly erythematous macules with underlying dyspigmentation - Recommend daily broad spectrum sunscreen SPF 30+ to sun-exposed areas, reapply every 2 hours as needed.  - Staying in the shade or wearing long sleeves, sun glasses (UVA+UVB protection) and wide brim hats (4-inch brim around the entire circumference of the hat) are also recommended for sun protection.  - Call for new or changing lesions.  Purpura - Chronic; persistent and recurrent.  Treatable, but not curable. Arms  - Violaceous macules and patches - Benign - Related to trauma, age, sun damage and/or use of blood thinners, chronic use of topical and/or oral steroids - Observe - Can use OTC arnica containing moisturizer such as Dermend Bruise Formula if desired - Call for worsening or other concerns   History of Basal Cell Carcinoma of the Skin Post neck 2019 Right medial upper eyelid 2019 - No evidence of recurrence today - Recommend regular full body skin exams - Recommend daily broad spectrum sunscreen SPF 30+ to sun-exposed areas, reapply every 2 hours as needed.  - Call if any new or changing lesions are noted between office visits   Skin cancer screening performed today.   Return in about 6 months (around 07/27/2022) for TBSE, hxnof BCC, laser for rosacea prn .  IMarye Round, CMA, am acting as scribe for Sarina Ser, MD .  Documentation: I have reviewed the above documentation for accuracy and completeness, and I agree with the above.  Sarina Ser, MD

## 2022-01-26 NOTE — Patient Instructions (Addendum)
Cryotherapy Aftercare  Wash gently with soap and water everyday.   Apply Vaseline and Band-Aid daily until healed.     Due to recent changes in healthcare laws, you may see results of your pathology and/or laboratory studies on MyChart before the doctors have had a chance to review them. We understand that in some cases there may be results that are confusing or concerning to you. Please understand that not all results are received at the same time and often the doctors may need to interpret multiple results in order to provide you with the best plan of care or course of treatment. Therefore, we ask that you please give us 2 business days to thoroughly review all your results before contacting the office for clarification. Should we see a critical lab result, you will be contacted sooner.   If You Need Anything After Your Visit  If you have any questions or concerns for your doctor, please call our main line at [phone] and press option 4 to reach your doctor's medical assistant. If no one answers, please leave a voicemail as directed and we will return your call as soon as possible. Messages left after 4 pm will be answered the following business day.   You may also send us a message via MyChart. We typically respond to MyChart messages within 1-2 business days.  For prescription refills, please ask your pharmacy to contact our office. Our fax number is [phone].  If you have an urgent issue when the clinic is closed that cannot wait until the next business day, you can page your doctor at the number below.    Please note that while we do our best to be available for urgent issues outside of office hours, we are not available 24/7.   If you have an urgent issue and are unable to reach us, you may choose to seek medical care at your doctor's office, retail clinic, urgent care center, or emergency room.  If you have a medical emergency, please immediately call 911 or go to the  emergency department.  Pager Numbers  - Dr. Kowalski: [phone]  - Dr. Moye: [phone]  - Dr. Stewart: [phone]  In the event of inclement weather, please call our main line at [phone] for an update on the status of any delays or closures.  Dermatology Medication Tips: Please keep the boxes that topical medications come in in order to help keep track of the instructions about where and how to use these. Pharmacies typically print the medication instructions only on the boxes and not directly on the medication tubes.   If your medication is too expensive, please contact our office at [phone] option 4 or send us a message through MyChart.   We are unable to tell what your co-pay for medications will be in advance as this is different depending on your insurance coverage. However, we may be able to find a substitute medication at lower cost or fill out paperwork to get insurance to cover a needed medication.   If a prior authorization is required to get your medication covered by your insurance company, please allow us 1-2 business days to complete this process.  Drug prices often vary depending on where the prescription is filled and some pharmacies may offer cheaper prices.  The website www.goodrx.com contains coupons for medications through different pharmacies. The prices here do not account for what the cost may be with help from insurance (it may be cheaper with your insurance), but the website can   give you the price if you did not use any insurance.  - You can print the associated coupon and take it with your prescription to the pharmacy.  - You may also stop by our office during regular business hours and pick up a GoodRx coupon card.  - If you need your prescription sent electronically to a different pharmacy, notify our office through  MyChart or by phone at [phone] option 4.     Si Usted Necesita Algo Despus de Su Visita  Tambin puede  enviarnos un mensaje a travs de MyChart. Por lo general respondemos a los mensajes de MyChart en el transcurso de 1 a 2 das hbiles.  Para renovar recetas, por favor pida a su farmacia que se ponga en contacto con nuestra oficina. Nuestro nmero de fax es el [phone].  Si tiene un asunto urgente cuando la clnica est cerrada y que no puede esperar hasta el siguiente da hbil, puede llamar/localizar a su doctor(a) al nmero que aparece a continuacin.   Por favor, tenga en cuenta que aunque hacemos todo lo posible para estar disponibles para asuntos urgentes fuera del horario de oficina, no estamos disponibles las 24 horas del da, los 7 das de la semana.   Si tiene un problema urgente y no puede comunicarse con nosotros, puede optar por buscar atencin mdica  en el consultorio de su doctor(a), en una clnica privada, en un centro de atencin urgente o en una sala de emergencias.  Si tiene una emergencia mdica, por favor llame inmediatamente al 911 o vaya a la sala de emergencias.  Nmeros de bper  - Dr. Kowalski: [phone]  - Dra. Moye: [phone]  - Dra. Stewart: [phone]  En caso de inclemencias del tiempo, por favor llame a nuestra lnea principal al [phone] para una actualizacin sobre el estado de cualquier retraso o cierre.  Consejos para la medicacin en dermatologa: Por favor, guarde las cajas en las que vienen los medicamentos de uso tpico para ayudarle a seguir las instrucciones sobre dnde y cmo usarlos. Las farmacias generalmente imprimen las instrucciones del medicamento slo en las cajas y no directamente en los tubos del medicamento.   Si su medicamento es muy caro, por favor, pngase en contacto con nuestra oficina llamando al [phone] y presione la opcin 4 o envenos un mensaje a travs de MyChart.   No podemos decirle cul ser su copago por los medicamentos por adelantado ya que esto es diferente dependiendo de la cobertura de su seguro.  Sin embargo, es posible que podamos encontrar un medicamento sustituto a menor costo o llenar un formulario para que el seguro cubra el medicamento que se considera necesario.   Si se requiere una autorizacin previa para que su compaa de seguros cubra su medicamento, por favor permtanos de 1 a 2 das hbiles para completar este proceso.  Los precios de los medicamentos varan con frecuencia dependiendo del lugar de dnde se surte la receta y alguna farmacias pueden ofrecer precios ms baratos.  El sitio web www.goodrx.com tiene cupones para medicamentos de diferentes farmacias. Los precios aqu no tienen en cuenta lo que podra costar con la ayuda del seguro (puede ser ms barato con su seguro), pero el sitio web puede darle el precio si no utiliz ningn seguro.  - Puede imprimir el cupn correspondiente y llevarlo con su receta a la farmacia.  - Tambin puede pasar por nuestra oficina durante el horario de atencin regular y recoger una tarjeta de cupones de GoodRx.  -   Si necesita que su receta se enve electrnicamente a una farmacia diferente, informe a nuestra oficina a travs de MyChart de [place] o por telfono llamando al [phone] y presione la opcin 4.  

## 2022-02-04 ENCOUNTER — Encounter: Payer: Self-pay | Admitting: Dermatology

## 2022-02-21 ENCOUNTER — Other Ambulatory Visit: Payer: Self-pay | Admitting: Family Medicine

## 2022-02-21 DIAGNOSIS — I10 Essential (primary) hypertension: Secondary | ICD-10-CM

## 2022-02-21 MED ORDER — LOSARTAN POTASSIUM-HCTZ 100-25 MG PO TABS: 1.0000 | ORAL_TABLET | Freq: Every day | ORAL | 1 refills | Status: DC

## 2022-02-21 NOTE — Telephone Encounter (Signed)
Requested Prescriptions  Pending Prescriptions Disp Refills   losartan-hydrochlorothiazide (HYZAAR) 100-25 MG tablet 90 tablet 0    Sig: Take 1 tablet by mouth daily.     Cardiovascular: ARB + Diuretic Combos Failed - 02/21/2022  4:24 PM      Failed - Last BP in normal range    BP Readings from Last 1 Encounters:  01/10/22 (!) 157/80         Passed - K in normal range and within 180 days    Potassium  Date Value Ref Range Status  11/08/2021 4.4 3.5 - 5.2 mmol/L Final         Passed - Na in normal range and within 180 days    Sodium  Date Value Ref Range Status  11/08/2021 140 134 - 144 mmol/L Final         Passed - Cr in normal range and within 180 days    Creatinine, Ser  Date Value Ref Range Status  11/08/2021 1.10 0.76 - 1.27 mg/dL Final         Passed - eGFR is 10 or above and within 180 days    GFR calc Af Amer  Date Value Ref Range Status  06/10/2019 88 >59 mL/min/1.73 Final   GFR calc non Af Amer  Date Value Ref Range Status  06/10/2019 76 >59 mL/min/1.73 Final   eGFR  Date Value Ref Range Status  11/08/2021 65 >59 mL/min/1.73 Final         Passed - Patient is not pregnant      Passed - Valid encounter within last 6 months    Recent Outpatient Visits           1 month ago Primary hypertension   [place], Donald E, MD   3 months ago Annual physical exam   [facility] Birdie Sons, MD   1 year ago Primary hypertension   [facility] Birdie Sons, MD   1 year ago Annual physical exam   [facility] Birdie Sons, MD   2 years ago Essential hypertension   [place], Kirstie Peri, MD       Future Appointments             In 1 week End, Harrell Gave, MD [facility]   In 1 month Fisher, Kirstie Peri, MD [facility]   In 11 months Ralene Bathe, MD Edith Endave

## 2022-02-21 NOTE — Telephone Encounter (Signed)
Medication Refill - Medication: 90 day supply losartan-hydrochlorothiazide (HYZAAR) 100-25 MG tablet   Has the patient contacted their pharmacy? No. Caller states patient had no refills   Preferred Pharmacy (with phone number or street name):  [place], [place] Phone: [phone]  Fax: [phone]      Has the patient been seen for an appointment in the last year OR does the patient have an upcoming appointment? Yes.    Agent: Please be advised that RX refills may take up to 3 business days. We ask that you follow-up with your pharmacy.

## 2022-03-01 NOTE — Progress Notes (Signed)
New Outpatient Visit Date: 03/02/2022  Referring Provider: Birdie Sons, [address] [place] [place],  [place] [address]  Chief Complaint: Coronary artery calcification  HPI:  Dennis Cooper is a 88 y.o. male who is being seen today for the evaluation of coronary artery calcification at the request of Dr. Caryn Section. He has a history of hypertension, left bundle branch block, and prostate cancer.  He underwent coronary calcium scoring in October, which revealed heavy coronary artery calcification with a CAC of 1006 AU.  Emphysematous changes were incidentally noted in the visualized lungs.  Today, Dennis Cooper is feeling well.  He notes some discoloration and swelling about his eyes after recent eyelid surgery at [facility].  He has not had any chest pain, shortness of breath, palpitations, or edema.  He exercises regularly and was previously an avid runner and hiker (he has run 22 marathons and hiked the entire [place] trail).  He no longer runs but continues to walk without any limitations.  He denies prior cardiac testing other than recent coronary calcium score CT.  He reports that he was previously on amlodipine 10 mg daily, though this was decreased due to soft blood pressures by Dr. Caryn Section earlier this year.  --------------------------------------------------------------------------------------------------  Cardiovascular History & Procedures: Cardiovascular Problems: Coronary artery calcification  Risk Factors: Hypertension, male gender, age greater than 33, and remote tobacco use  Cath/PCI: None  CV Surgery: None  EP Procedures and Devices: None  Non-Invasive Evaluation(s): Coronary calcium score (01/05/2022): Normal origin of coronary arteries.  Coronary calcium score 1006 AU, with highest degree of calcification involving the LAD.  Emphysematous changes incidentally noted in the visualized lungs.  Recent CV Pertinent Labs: Lab Results  Component Value Date   CHOL 149  06/16/2020   HDL 74 06/16/2020   LDLCALC 64 06/16/2020   TRIG 52 06/16/2020   CHOLHDL 2.0 06/16/2020   BNP 43.1 07/15/2015   K 4.4 11/08/2021   BUN 24 11/08/2021   CREATININE 1.10 11/08/2021    --------------------------------------------------------------------------------------------------  Past Medical History:  Diagnosis Date   Cancer [facility])    prostate   Erectile dysfunction    History of basal cell cancer 12/27/2017   post neck - nodular   History of chicken pox    History of colonic polyps    History of measles as a child    Hx of basal cell carcinoma 09/06/2017   right medial upper eyelid - nodular   Hypertension    Left bundle branch block (LBBB)    Plantar fasciitis 10/19/2014   Precancerous lesion    Wears dentures    wears upper partial   Wears hearing aid in both ears    has both, only wears left    Past Surgical History:  Procedure Laterality Date   BLEPHAROPLASTY     03/02/2022   CATARACT EXTRACTION W/PHACO Right 03/29/2021   Procedure: CATARACT EXTRACTION PHACO AND INTRAOCULAR LENS PLACEMENT (IOC) RIGHT 4.59 00:37.1;  Surgeon: Birder Robson, MD;  Location: [place];  Service: Ophthalmology;  Laterality: Right;   CATARACT EXTRACTION W/PHACO Left 04/12/2021   Procedure: CATARACT EXTRACTION PHACO AND INTRAOCULAR LENS PLACEMENT (IOC) LEFT 8.21 00:51.0;  Surgeon: Birder Robson, MD;  Location: [place];  Service: Ophthalmology;  Laterality: Left;   CHOLECYSTECTOMY  2010   Dr. Jamal Collin   COLONOSCOPY  04/2008   COLONOSCOPY WITH PROPOFOL N/A 10/12/2017   Procedure: COLONOSCOPY WITH PROPOFOL;  Surgeon: Manya Silvas, MD;  Location: [facility] ENDOSCOPY;  Service: Endoscopy;  Laterality: N/A;   ESOPHAGOGASTRODUODENOSCOPY     HERNIA REPAIR  2010   inguinal; Dr. Jamal Collin   stomach ulcer  1971    Current Meds  Medication Sig   amLODipine (NORVASC) 5 MG tablet Take 1 tablet (5 mg total) by mouth daily.   aspirin EC 81 MG tablet Take 81  mg by mouth daily.   erythromycin ophthalmic ointment Place 1 Application into both eyes as directed.   losartan-hydrochlorothiazide (HYZAAR) 100-25 MG tablet Take 1 tablet by mouth daily.   Multiple Vitamins-Minerals (MULTIVITAMIN ADULT PO) Take 1 tablet by mouth daily.   rosuvastatin (CRESTOR) 10 MG tablet Take 1 tablet (10 mg total) by mouth daily.    Allergies: Patient has no known allergies.  Social History   Tobacco Use   Smoking status: Former    Types: Cigarettes    Quit date: 03/30/1978    Years since quitting: 43.9   Smokeless tobacco: Never  Vaping Use   Vaping Use: Never used  Substance Use Topics   Alcohol use: Not Currently    Comment: Quit 11/06/17   Drug use: No    Family History  Problem Relation Age of Onset   Diabetes Sister    Diabetes Sister    Heart attack Sister    Diabetes Sister    Arthritis Sister    Hypertension Sister    Congestive Heart Failure Brother    Diabetes Brother    Diabetes Brother    Hypertension Brother    Arthritis Brother    Cancer Brother    Heart attack Brother     Review of Systems: A 12-system review of systems was performed and was negative except as noted in the HPI.  --------------------------------------------------------------------------------------------------  Physical Exam: BP (!) 160/90 (BP Location: Right Arm, Patient Position: Sitting, Cuff Size: Large)   Pulse (!) 56   Ht '5\' 9"'$  (1.753 m)   Wt 180 lb (81.6 kg)   SpO2 98%   BMI 26.58 kg/m  Repeat BP: 168/80  General: NAD. HEENT: No conjunctival pallor or scleral icterus. Neck: Supple without lymphadenopathy, thyromegaly, JVD, or HJR. No carotid bruit. Lungs: Normal work of breathing. Clear to auscultation bilaterally without wheezes or crackles. Heart: Regular rate and rhythm without murmurs, rubs, or gallops. Non-displaced PMI. Abd: Bowel sounds present. Soft, NT/ND without hepatosplenomegaly Ext: No lower extremity edema. Radial, PT, and DP pulses  are 2+ bilaterally Skin: Warm and dry without rash. Neuro: CNIII-XII intact. Strength and fine-touch sensation intact in upper and lower extremities bilaterally. Psych: Normal mood and affect.  EKG: Sinus bradycardia with left axis deviation and left bundle branch block.  Compared with prior tracing from 11/08/2020, PVC is no longer present.  Otherwise, no significant interval change.  Lab Results  Component Value Date   WBC 12.8 (H) 11/08/2021   HGB 14.9 11/08/2021   HCT 44.1 11/08/2021   MCV 91 11/08/2021   PLT 181 11/08/2021    Lab Results  Component Value Date   NA 140 11/08/2021   K 4.4 11/08/2021   CL 102 11/08/2021   CO2 22 11/08/2021   BUN 24 11/08/2021   CREATININE 1.10 11/08/2021   GLUCOSE 85 11/08/2021   ALT 31 11/16/2021    Lab Results  Component Value Date   CHOL 149 06/16/2020   HDL 74 06/16/2020   LDLCALC 64 06/16/2020   TRIG 52 06/16/2020   CHOLHDL 2.0 06/16/2020     --------------------------------------------------------------------------------------------------  ASSESSMENT AND PLAN: Coronary artery calcification and left bundle branch block:  Dennis Cooper is asymptomatic and still exercises regularly without any limitations.  However, his high coronary artery calcium score, advanced age, and family history place him at risk for underlying ischemic heart disease.  He also has a longstanding LBBB.  We have therefore agreed to obtain a pharmacologic myocardial perfusion stress test (exercise MPI would increase risk for perfusion defect/artifact in the setting of LBBB) as well as a transthoracic echocardiogram.  Continue aspirin and statin therapy.  Hypertension: Blood pressure suboptimally controlled today.  Given concerns surrounding low blood pressure on amlodipine 10 mg daily, we have agreed to increase amlodipine to 7.5 mg daily.  Dennis Cooper should continue current dose of losartan-HCTZ.  Hyperlipidemia: Lipids well-controlled on last check in 05/2020.  We  will continue rosuvastatin 10 mg daily with plans to repeat a lipid panel when Dennis Cooper Cooper for his stress test later this month.  Shared Decision Making/Informed Consent The risks [chest pain, shortness of breath, cardiac arrhythmias, dizziness, blood pressure fluctuations, myocardial infarction, stroke/transient ischemic attack, nausea, vomiting, allergic reaction, radiation exposure, metallic taste sensation and life-threatening complications (estimated to be 1 in 10,000)], benefits (risk stratification, diagnosing coronary artery disease, treatment guidance) and alternatives of a nuclear stress test were discussed in detail with Dennis Cooper and he agrees to proceed.  Follow-up: Return to clinic in 6 weeks.  Nelva Bush, MD 03/02/2022 8:41 AM

## 2022-03-02 ENCOUNTER — Telehealth: Payer: Self-pay | Admitting: Internal Medicine

## 2022-03-02 ENCOUNTER — Ambulatory Visit: Payer: Medicare Other | Attending: Internal Medicine | Admitting: Internal Medicine

## 2022-03-02 ENCOUNTER — Encounter: Payer: Self-pay | Admitting: Internal Medicine

## 2022-03-02 VITALS — BP 168/80 | HR 56 | Ht 69.0 in | Wt 180.0 lb

## 2022-03-02 DIAGNOSIS — I10 Essential (primary) hypertension: Secondary | ICD-10-CM

## 2022-03-02 DIAGNOSIS — I447 Left bundle-branch block, unspecified: Secondary | ICD-10-CM | POA: Diagnosis not present

## 2022-03-02 DIAGNOSIS — I2510 Atherosclerotic heart disease of native coronary artery without angina pectoris: Secondary | ICD-10-CM | POA: Diagnosis not present

## 2022-03-02 DIAGNOSIS — E785 Hyperlipidemia, unspecified: Secondary | ICD-10-CM | POA: Diagnosis not present

## 2022-03-02 MED ORDER — AMLODIPINE BESYLATE 5 MG PO TABS: 7.5000 mg | ORAL_TABLET | Freq: Every day | ORAL | 3 refills | Status: DC

## 2022-03-02 MED ORDER — AMLODIPINE BESYLATE 5 MG PO TABS: 7.5000 mg | ORAL_TABLET | Freq: Every day | ORAL | 1 refills | Status: DC

## 2022-03-02 NOTE — Telephone Encounter (Signed)
*  STAT* If patient is at the pharmacy, call can be transferred to refill team.   1. Which medications need to be refilled? (please list name of each medication and dose if known) amLODipine (NORVASC) 5 MG tablet   2. Which pharmacy/location (including street and city if local pharmacy) is medication to be sent to? [facility] ([place], [place]   3. Do they need a 30 day or 90 day supply? 1   Was sent to the wrong pharmacy prior too

## 2022-03-02 NOTE — Telephone Encounter (Signed)
Requested Prescriptions   Signed Prescriptions Disp Refills   amLODipine (NORVASC) 5 MG tablet 135 tablet 1    Sig: Take 1.5 tablets (7.5 mg total) by mouth daily.    Authorizing Provider: END, CHRISTOPHER    Ordering User: Raelene Bott, Malaak Stach L

## 2022-03-02 NOTE — Patient Instructions (Addendum)
Medication Instructions:  Your physician recommends the following medication changes.  INCREASE: Amlodipine to 7.5 mg by mouth daily   *If you need a refill on your cardiac medications before your next appointment, please call your pharmacy*   Lab Work: Your provider would like for you to return  to have the following labs drawn: (Lipid).   Please go to the [facility] entrance and check in at the front desk.  You do not need an appointment.  They are open from 7am-6 pm.  You will need to be fasting.    Testing/Procedures: Your physician has requested that you have an echocardiogram. Echocardiography is a painless test that uses sound waves to create images of your heart. It provides your doctor with information about the size and shape of your heart and how well your heart's chambers and valves are working.   You may receive an ultrasound enhancing agent through an IV if needed to better visualize your heart during the echo. This procedure takes approximately one hour.  There are no restrictions for this procedure.  This will take place at [facility] [address], [place]   Your provider has ordered a Lexiscan/ Exercise Myoview Stress test. This will take place at [facility] Please report to the [facility] medical mall entrance. The volunteers at the first desk will direct you where to go.  [place]  Your provider has ordered a Stress Test with nuclear imaging. The purpose of this test is to evaluate the blood supply to your heart muscle. This procedure is referred to as a "Non-Invasive Stress Test." This is because other than having an IV started in your vein, nothing is inserted or "invades" your body. Cardiac stress tests are done to find areas of poor blood flow to the heart by determining the extent of coronary artery disease (CAD). Some patients exercise on a treadmill, which naturally increases the blood flow to your heart, while others who are  unable to walk on a treadmill due to physical limitations will have a pharmacologic/chemical stress agent called Lexiscan . This medicine will mimic walking on a treadmill by temporarily increasing your coronary blood flow.   Please note: these test may take anywhere between 2-4 hours to complete  How to prepare for your Myoview test:  Do not eat or drink after midnight No caffeine for 24 hours prior to test No smoking 24 hours prior to test. Your medication may be taken with water.  If your doctor stopped a medication because of this test, do not take that medication. Ladies, please do not wear dresses.  Skirts or pants are appropriate. Please wear a short sleeve shirt. No perfume, cologne or lotion. Wear comfortable walking shoes. No heels!   PLEASE NOTIFY THE OFFICE AT LEAST 108 HOURS IN ADVANCE IF YOU ARE UNABLE TO KEEP YOUR APPOINTMENT.  [phone] AND  PLEASE NOTIFY NUCLEAR MEDICINE AT [facility] AT LEAST 24 HOURS IN ADVANCE IF YOU ARE UNABLE TO KEEP YOUR APPOINTMENT. [phone]    Follow-Up: At [facility], you and your health needs are our priority.  As part of our continuing mission to provide you with exceptional heart care, we have created designated Provider Care Teams.  These Care Teams include your primary Cardiologist (physician) and Advanced Practice Providers (APPs -  Physician Assistants and Nurse Practitioners) who all work together to provide you with the care you need, when you need it.  We recommend signing up for the patient portal called "  MyChart".  Sign up information is provided on this After Visit Summary.  MyChart is used to connect with patients for Virtual Visits (Telemedicine).  Patients are able to view lab/test results, encounter notes, upcoming appointments, etc.  Non-urgent messages can be sent to your provider as well.   To learn more about what you can do with MyChart, go to [email or web address].    Your next appointment:   6 week(s)  The  format for your next appointment:   In Person  Provider:   You may see Nelva Bush, MD or one of the following Advanced Practice Providers on your designated Care Team:   Murray Hodgkins, NP Christell Faith, PA-C Cadence Kathlen Mody, PA-C Gerrie Nordmann, NP

## 2022-03-07 ENCOUNTER — Encounter
Admission: RE | Admit: 2022-03-07 | Discharge: 2022-03-07 | Disposition: A | Discharge status: Home / Self Care | Payer: Medicare Other | Source: Ambulatory Visit | Attending: Internal Medicine | Admitting: Internal Medicine

## 2022-03-07 DIAGNOSIS — I2510 Atherosclerotic heart disease of native coronary artery without angina pectoris: Secondary | ICD-10-CM

## 2022-03-07 LAB — NM MYOCAR MULTI W/SPECT W/WALL MOTION / EF
LV dias vol: 100 mL (ref 62–150)
LV sys vol: 38 mL
Nuc Stress EF: 62 %
Peak HR: 81 {beats}/min
Percent HR: 60 %
Rest HR: 55 {beats}/min
Rest Nuclear Isotope Dose: 10.6 mCi
SDS: 3
SRS: 9
SSS: 4
Stress Nuclear Isotope Dose: 31.2 mCi
TID: 0.87

## 2022-03-07 MED ORDER — TECHNETIUM TC 99M TETROFOSMIN IV KIT
31.2000 | PACK | Freq: Once | INTRAVENOUS | Status: AC | PRN
  Administered 2022-03-07: 31.2 via INTRAVENOUS

## 2022-03-07 MED ORDER — REGADENOSON 0.4 MG/5ML IV SOLN
0.4000 mg | Freq: Once | INTRAVENOUS | Status: AC
  Administered 2022-03-07: 0.4 mg via INTRAVENOUS

## 2022-03-07 MED ORDER — TECHNETIUM TC 99M TETROFOSMIN IV KIT
10.0000 | PACK | Freq: Once | INTRAVENOUS | Status: AC | PRN
  Administered 2022-03-07: 10.62 via INTRAVENOUS

## 2022-03-10 ENCOUNTER — Other Ambulatory Visit
Admission: RE | Admit: 2022-03-10 | Discharge: 2022-03-10 | Disposition: A | Discharge status: Home / Self Care | Payer: Medicare Other | Source: Home / Self Care | Attending: Internal Medicine | Admitting: Internal Medicine

## 2022-03-10 ENCOUNTER — Ambulatory Visit
Admission: RE | Admit: 2022-03-10 | Discharge: 2022-03-10 | Disposition: A | Discharge status: Home / Self Care | Payer: Medicare Other | Source: Ambulatory Visit | Attending: Internal Medicine | Admitting: Internal Medicine

## 2022-03-10 DIAGNOSIS — I082 Rheumatic disorders of both aortic and tricuspid valves: Secondary | ICD-10-CM | POA: Diagnosis not present

## 2022-03-10 DIAGNOSIS — I10 Essential (primary) hypertension: Secondary | ICD-10-CM | POA: Diagnosis not present

## 2022-03-10 DIAGNOSIS — I2510 Atherosclerotic heart disease of native coronary artery without angina pectoris: Secondary | ICD-10-CM

## 2022-03-10 DIAGNOSIS — I447 Left bundle-branch block, unspecified: Secondary | ICD-10-CM

## 2022-03-10 LAB — ECHOCARDIOGRAM COMPLETE
AR max vel: 2.07 cm2
AV Area VTI: 2.2 cm2
AV Area mean vel: 1.94 cm2
AV Mean grad: 4 mmHg
AV Peak grad: 6.7 mmHg
Ao pk vel: 1.29 m/s
Area-P 1/2: 2.03 cm2
S' Lateral: 3 cm

## 2022-03-10 LAB — LIPID PANEL
Cholesterol: 96 mg/dL (ref 0–200)
HDL: 52 mg/dL (ref >40)
LDL Cholesterol: 36 mg/dL (ref 0–99)
Total CHOL/HDL Ratio: 1.8 RATIO
Triglycerides: 39 mg/dL (ref <150)
VLDL: 8 mg/dL (ref 0–40)

## 2022-03-10 NOTE — Progress Notes (Signed)
*  PRELIMINARY RESULTS* Echocardiogram 2D Echocardiogram has been performed.  Sherrie Sport 03/10/2022, 11:37 AM

## 2022-04-18 ENCOUNTER — Ambulatory Visit (INDEPENDENT_AMBULATORY_CARE_PROVIDER_SITE_OTHER): Payer: Medicare Other | Admitting: Family Medicine

## 2022-04-18 ENCOUNTER — Encounter: Payer: Self-pay | Admitting: Family Medicine

## 2022-04-18 VITALS — BP 148/76 | HR 68 | Temp 97.6°F | Ht 69.0 in | Wt 182.0 lb

## 2022-04-18 DIAGNOSIS — L308 Other specified dermatitis: Secondary | ICD-10-CM | POA: Diagnosis not present

## 2022-04-18 DIAGNOSIS — S9001XA Contusion of right ankle, initial encounter: Secondary | ICD-10-CM | POA: Diagnosis not present

## 2022-04-18 DIAGNOSIS — I10 Essential (primary) hypertension: Secondary | ICD-10-CM | POA: Diagnosis not present

## 2022-04-18 MED ORDER — KETOCONAZOLE 2 % EX CREA: 1.0000 | TOPICAL_CREAM | Freq: Every day | CUTANEOUS | 0 refills | Status: DC

## 2022-04-18 MED ORDER — AMLODIPINE BESYLATE 10 MG PO TABS: 10.0000 mg | ORAL_TABLET | Freq: Every day | ORAL | 0 refills | Status: DC

## 2022-04-18 MED ORDER — TRIAMCINOLONE ACETONIDE 0.025 % EX CREA: 1.0000 | TOPICAL_CREAM | Freq: Two times a day (BID) | CUTANEOUS | 0 refills | Status: DC

## 2022-04-18 NOTE — Progress Notes (Signed)
Established patient visit   Patient: Dennis Cooper   DOB: Jan 14, 1933   89 y.o. Male  MRN: [id number] Visit Date: 04/18/2022  Today's healthcare provider: Lelon Huh, MD   No chief complaint on file.  Subjective    Here to follow up for hypertension. Doing well on current medications, but does not check home blood pressure consistently.   He also reports he pulled his right calf muscle a few weeks ago which is healed. But has had bruising discoloration around back of ankle every since.   He also states he has developed sore area over tailbone since long drive back and forth between [employer] a few weeks ago.   Medications: Outpatient Medications Prior to Visit  Medication Sig   amLODipine (NORVASC) 5 MG tablet Take 1.5 tablets (7.5 mg total) by mouth daily.   aspirin EC 81 MG tablet Take 81 mg by mouth daily.   losartan-hydrochlorothiazide (HYZAAR) 100-25 MG tablet Take 1 tablet by mouth daily.   Multiple Vitamins-Minerals (MULTIVITAMIN ADULT PO) Take 1 tablet by mouth daily.   rosuvastatin (CRESTOR) 10 MG tablet Take 1 tablet (10 mg total) by mouth daily.   [DISCONTINUED] erythromycin ophthalmic ointment Place 1 Application into both eyes as directed. (Patient not taking: Reported on 04/18/2022)   No facility-administered medications prior to visit.    Review of Systems  Constitutional:  Negative for appetite change, chills and fever.  Respiratory:  Negative for chest tightness, shortness of breath and wheezing.   Cardiovascular:  Negative for chest pain and palpitations.  Gastrointestinal:  Negative for abdominal pain, nausea and vomiting.       Objective    BP (!) 148/76 (BP Location: Right Arm, Patient Position: Sitting, Cuff Size: Normal)   Pulse 68   Temp 97.6 F (36.4 C)   Ht '5\' 9"'$  (1.753 m)   Wt 182 lb (82.6 kg)   SpO2 98%   BMI 26.88 kg/m    Physical Exam   General appearance: Well developed, well nourished male, cooperative and in no acute  distress Head: Normocephalic, without obvious abnormality, atraumatic Respiratory: Respirations even and unlabored, normal respiratory rate Extremities: All extremities are intact. Purplish discoloration behind right ankle, not swollen or tender. Trace bilateral LE edema.  Skin: Skin color, texture, turgor normal. Dry flaky psoriatic rash along superior crease of buttocks.  Psych: Appropriate mood and affect. Neurologic: Mental status: Alert, oriented to person, place, and time, thought content appropriate.    Assessment & Plan     1. Primary hypertension Increase amLODipine (NORVASC) 10 MG tablet to take 1 tablet (10 mg total) by mouth daily.  Dispense: 90 tablet; Refill: 0  2. Psoriasiform dermatitis (buttocks) ketoconazole (NIZORAL) 2 % cream; Apply 1 Application topically daily.  Dispense: 30 g; Refill: 0 - triamcinolone (KENALOG) 0.025 % cream; Apply 1 Application topically 2 (two) times daily.  Dispense: 30 g; Refill: 0   3. Contusion of right ankle, initial encounter Secondary to right calf strain which has otherwise resolved.   Future Appointments  Date Time Provider [place]  04/27/2022  8:20 AM End, Harrell Gave, MD [facility] None  06/13/2022  9:40 AM Birdie Sons, MD [facility]  02/01/2023  8:30 AM Ralene Bathe, MD [facility] None        The entirety of the information documented in the History of Present Illness, Review of Systems and Physical Exam were personally obtained by me. Portions of this information were initially documented by the [place] and  reviewed by me for thoroughness and accuracy.     Lelon Huh, MD  [place] [phone] (phone) [phone] (fax)  [place]

## 2022-04-18 NOTE — Patient Instructions (Signed)
.   Please review the attached list of medications and notify my office if there are any errors.   . Please bring all of your medications to every appointment so we can make sure that our medication list is the same as yours.   

## 2022-04-26 ENCOUNTER — Ambulatory Visit: Payer: Medicare Other | Admitting: Internal Medicine

## 2022-04-27 ENCOUNTER — Encounter: Payer: Self-pay | Admitting: Internal Medicine

## 2022-04-27 ENCOUNTER — Ambulatory Visit: Payer: Medicare Other | Attending: Internal Medicine | Admitting: Internal Medicine

## 2022-04-27 VITALS — BP 124/70 | HR 69 | Ht 69.0 in | Wt 184.0 lb

## 2022-04-27 DIAGNOSIS — I2584 Coronary atherosclerosis due to calcified coronary lesion: Secondary | ICD-10-CM

## 2022-04-27 DIAGNOSIS — I2510 Atherosclerotic heart disease of native coronary artery without angina pectoris: Secondary | ICD-10-CM

## 2022-04-27 DIAGNOSIS — I447 Left bundle-branch block, unspecified: Secondary | ICD-10-CM | POA: Diagnosis not present

## 2022-04-27 DIAGNOSIS — E785 Hyperlipidemia, unspecified: Secondary | ICD-10-CM | POA: Diagnosis not present

## 2022-04-27 DIAGNOSIS — I10 Essential (primary) hypertension: Secondary | ICD-10-CM

## 2022-04-27 NOTE — Progress Notes (Signed)
Follow-up Outpatient Visit Date: 04/27/2022  Primary Care Provider: Birdie Sons, MD [address] [place] [address]  Chief Complaint: Follow-up coronary artery calcification  HPI:  Dennis Cooper is a 89 y.o. male with history of coronary artery calcification, hypertension, left bundle branch block, and prostate cancer, who presents for follow-up of coronary artery disease.  I met him in mid December.  He had undergone coronary calcium scoring in October, which revealed severe calcification with a CAC score of 1006 AU.  At our visit, he was feeling well without chest pain or shortness of breath.  He continued to exercise regularly without difficulty.  We agreed to obtain an echocardiogram and pharmacologic myocardial perfusion stress test.  MPI was low risk with apical anterior, apical lateral, and apical defect seen only on the attenuation correction images suggestive of artifact.  LVEF was normal.  Subsequent echo was read as LVEF of 30-35%, though on my personal review, echo was most consistent with low normal LVEF.  Today, Dennis Cooper reports feeling well other than resolving right calf pain that occurred after he stopped awkwardly on the curb.  He denies chest pain, shortness of breath, palpitations, and lightheadedness.  He had some swelling and bruising of the right calf related to the aforementioned injury, with the bruising extending down to the posterior ankle.  This has mostly resolved.  He had taken a break from walking but has continued to do other exercises.  --------------------------------------------------------------------------------------------------  Cardiovascular History & Procedures: Cardiovascular Problems: Coronary artery calcification   Risk Factors: Hypertension, male gender, age greater than 23, and remote tobacco use   Cath/PCI: None   CV Surgery: None   EP Procedures and Devices: None   Non-Invasive Evaluation(s): TTE (03/10/2022):  Normal LV size mild LVH.  LVEF 30-35% (on my review, LVEF closer to 50%).  With grade 1 diastolic dysfunction.  Normal RV size and function.  Mild pulmonary hypertension.  Normal biatrial size.  Mild-moderate tricuspid regurgitation.  Otherwise, no significant valvular abnormalities. Pharmacologic MPI (03/07/2022): Low risk, probably normal study with moderate-sized, mild in severity, reversible defect involving the apical anterior, apical lateral, and apical segments seen only on the attenuation correction images suggestive of artifact.  Mild ischemia cannot be excluded.  No scar identified.  LVEF 55 to 65%.  Coronary artery calcification and aortic atherosclerosis present. Coronary calcium score (01/05/2022): Normal origin of coronary arteries.  Coronary calcium score 1006 AU, with highest degree of calcification involving the LAD.  Emphysematous changes incidentally noted in the visualized lungs.  Recent CV Pertinent Labs: Lab Results  Component Value Date   CHOL 96 03/10/2022   CHOL 149 06/16/2020   HDL 52 03/10/2022   HDL 74 06/16/2020   LDLCALC 36 03/10/2022   LDLCALC 64 06/16/2020   TRIG 39 03/10/2022   CHOLHDL 1.8 03/10/2022   BNP 43.1 07/15/2015   K 4.4 11/08/2021   BUN 24 11/08/2021   CREATININE 1.10 11/08/2021    Past medical and surgical history were reviewed and updated in EPIC.  Current Meds  Medication Sig   amLODipine (NORVASC) 10 MG tablet Take 1 tablet (10 mg total) by mouth daily.   aspirin EC 81 MG tablet Take 81 mg by mouth daily.   ketoconazole (NIZORAL) 2 % cream Apply 1 Application topically daily.   losartan-hydrochlorothiazide (HYZAAR) 100-25 MG tablet Take 1 tablet by mouth daily.   Multiple Vitamins-Minerals (MULTIVITAMIN ADULT PO) Take 1 tablet by mouth daily.   rosuvastatin (CRESTOR) 10 MG tablet  Take 1 tablet (10 mg total) by mouth daily.   triamcinolone (KENALOG) 0.025 % cream Apply 1 Application topically 2 (two) times daily.    Allergies: Patient  has no known allergies.  Social History   Tobacco Use   Smoking status: Former    Packs/day: 2.00    Years: 24.00    Total pack years: 48.00    Types: Cigarettes    Quit date: 1980    Years since quitting: 44.1   Smokeless tobacco: Never  Vaping Use   Vaping Use: Never used  Substance Use Topics   Alcohol use: Not Currently    Comment: Quit 11/06/17   Drug use: No    Family History  Problem Relation Age of Onset   Heart attack Father 50   Diabetes Sister    Heart disease Sister    Diabetes Sister    Diabetes Sister    Arthritis Sister    Hypertension Sister    Heart attack Brother 73   Congestive Heart Failure Brother    Diabetes Brother    Diabetes Brother    Hypertension Brother    Arthritis Brother    Cancer Brother    Heart attack Brother 80    Review of Systems: A 12-system review of systems was performed and was negative except as noted in the HPI.  --------------------------------------------------------------------------------------------------  Physical Exam: BP 124/70 (BP Location: Left Arm, Patient Position: Sitting, Cuff Size: Large)   Pulse 69   Ht '5\' 9"'$  (1.753 m)   Wt 184 lb (83.5 kg)   SpO2 98%   BMI 27.17 kg/m   General:  NAD. Neck: No JVD or HJR. Lungs: Clear to auscultation bilaterally without wheezes or crackles. Heart: Regular rate and rhythm without murmurs, rubs, or gallops. Abdomen: Soft, nontender, nondistended. Extremities: No lower extremity edema.   Lab Results  Component Value Date   WBC 12.8 (H) 11/08/2021   HGB 14.9 11/08/2021   HCT 44.1 11/08/2021   MCV 91 11/08/2021   PLT 181 11/08/2021    Lab Results  Component Value Date   NA 140 11/08/2021   K 4.4 11/08/2021   CL 102 11/08/2021   CO2 22 11/08/2021   BUN 24 11/08/2021   CREATININE 1.10 11/08/2021   GLUCOSE 85 11/08/2021   ALT 31 11/16/2021    Lab Results  Component Value Date   CHOL 96 03/10/2022   HDL 52 03/10/2022   LDLCALC 36 03/10/2022   TRIG  39 03/10/2022   CHOLHDL 1.8 03/10/2022    --------------------------------------------------------------------------------------------------  ASSESSMENT AND PLAN: Coronary artery calcification: No angina reported.  Recent MPI was low risk.  Continue aspirin and statin therapy to prevent progression of disease.  LBBB: Noted on prior EKG.  Echo read as moderately reduced LVEF, though it appears low-normal on my assessment.  LVEF also normal on gated SPECT images.  Defer medication changes and additional testing today.  Hypertension: BP well-controlled.  Continue current regimen of amlodipine and losartan-HCTZ.  Hyperlipidemia: LDL well-controlled on last check in 02/2022.  Continue rosuvastatin 10 mg daily.  Follow-up: Return to clinic in 6 months.  Nelva Bush, MD 04/27/2022 1:59 PM

## 2022-04-27 NOTE — Patient Instructions (Signed)
Medication Instructions:  Your Physician recommend you continue on your current medication as directed.    *If you need a refill on your cardiac medications before your next appointment, please call your pharmacy*   Lab Work: None ordered today   Testing/Procedures: None ordered today   Follow-Up: At [facility], you and your health needs are our priority.  As part of our continuing mission to provide you with exceptional heart care, we have created designated Provider Care Teams.  These Care Teams include your primary Cardiologist (physician) and Advanced Practice Providers (APPs -  Physician Assistants and Nurse Practitioners) who all work together to provide you with the care you need, when you need it.  We recommend signing up for the patient portal called "MyChart".  Sign up information is provided on this After Visit Summary.  MyChart is used to connect with patients for Virtual Visits (Telemedicine).  Patients are able to view lab/test results, encounter notes, upcoming appointments, etc.  Non-urgent messages can be sent to your provider as well.   To learn more about what you can do with MyChart, go to [email or web address].    Your next appointment:   6 month(s)  Provider:   You may see Christopher End, MD or one of the following Advanced Practice Providers on your designated Care Team:   Christopher Berge, NP Ryan Dunn, PA-C Cadence Furth, PA-C Sheri Hammock, NP     

## 2022-05-15 ENCOUNTER — Other Ambulatory Visit: Payer: Self-pay | Admitting: Family Medicine

## 2022-05-15 DIAGNOSIS — I10 Essential (primary) hypertension: Secondary | ICD-10-CM

## 2022-05-16 NOTE — Telephone Encounter (Signed)
Requested medication (s) are due for refill today: yes   Requested medication (s) are on the active medication list: yes  Last refill:  02/21/22 #90 1 refills  Future visit scheduled: yes in 4 weeks   Notes to clinic:  protocol failed. Last labs 11/08/21. Requesting 1 year supply. Do you want to refill Rx?     Requested Prescriptions  Pending Prescriptions Disp Refills   losartan-hydrochlorothiazide (HYZAAR) 100-25 MG tablet [Pharmacy Med Name: Losartan Potassium-HCTZ 100-25 MG Oral Tablet] 90 tablet 3    Sig: Take 1 tablet by mouth daily.     Cardiovascular: ARB + Diuretic Combos Failed - 05/15/2022  8:29 AM      Failed - K in normal range and within 180 days    Potassium  Date Value Ref Range Status  11/08/2021 4.4 3.5 - 5.2 mmol/L Final         Failed - Na in normal range and within 180 days    Sodium  Date Value Ref Range Status  11/08/2021 140 134 - 144 mmol/L Final         Failed - Cr in normal range and within 180 days    Creatinine, Ser  Date Value Ref Range Status  11/08/2021 1.10 0.76 - 1.27 mg/dL Final         Failed - eGFR is 10 or above and within 180 days    GFR calc Af Amer  Date Value Ref Range Status  06/10/2019 88 >59 mL/min/1.73 Final   GFR calc non Af Amer  Date Value Ref Range Status  06/10/2019 76 >59 mL/min/1.73 Final   eGFR  Date Value Ref Range Status  11/08/2021 65 >59 mL/min/1.73 Final         Passed - Patient is not pregnant      Passed - Last BP in normal range    BP Readings from Last 1 Encounters:  04/27/22 124/70         Passed - Valid encounter within last 6 months    Recent Outpatient Visits           4 weeks ago Primary hypertension   [place], Donald E, MD   4 months ago Primary hypertension   [place], Donald E, MD   6 months ago Annual physical exam   [facility] Birdie Sons, MD   1 year ago Primary  hypertension   [place], Donald E, MD   1 year ago Annual physical exam   [facility] Birdie Sons, MD       Future Appointments             In 4 weeks Fisher, Kirstie Peri, MD [facility], [place]   In 8 months Ralene Bathe, MD [place]

## 2022-05-23 ENCOUNTER — Telehealth: Payer: Self-pay | Admitting: Family Medicine

## 2022-05-23 NOTE — Telephone Encounter (Signed)
[place] to schedule their annual wellness visit. Appointment made for 07/25/2022.  [place] Direct Dial: [phone]

## 2022-06-12 ENCOUNTER — Other Ambulatory Visit: Payer: Self-pay | Admitting: Family Medicine

## 2022-06-12 DIAGNOSIS — I10 Essential (primary) hypertension: Secondary | ICD-10-CM

## 2022-06-12 NOTE — Progress Notes (Unsigned)
     I,[name],acting as a Education administrator for Dennis Huh, MD.,have documented all relevant documentation on the behalf of Dennis Huh, MD,as directed by  Dennis Huh, MD while in the presence of Dennis Huh, MD.   Established patient visit   Patient: Dennis Cooper   DOB: 05/07/1932   89 y.o. Male  MRN: [id number] Visit Date: 06/13/2022  Today's healthcare provider: Lelon Huh, MD   No chief complaint on file.  Subjective    HPI  Hypertension, follow-up  BP Readings from Last 3 Encounters:  04/27/22 124/70  04/18/22 (!) 148/76  03/02/22 (!) 168/80   Wt Readings from Last 3 Encounters:  04/27/22 184 lb (83.5 kg)  04/18/22 182 lb (82.6 kg)  03/02/22 180 lb (81.6 kg)     He was last seen for hypertension 2 months ago.  BP at that visit was 148/76. Management since that visit includes increase amLODipine (NORVASC) 10 MG tablet to take 1 tablet (10 mg total) by mouth daily. Marland Kitchen  He reports {excellent/good/fair/poor:19665} compliance with treatment. He {is/is not:9024} having side effects. {document side effects if present:1} He is following a {diet:21022986} diet. He {is/is not:9024} exercising. He {does/does not:200015} smoke.  Use of agents associated with hypertension: {bp agents assoc with hypertension:511::"none"}.   Outside blood pressures are {***enter patient reported home BP readings, or 'not being checked':1}. Symptoms: {Yes/No:20286} chest pain {Yes/No:20286} chest pressure  {Yes/No:20286} palpitations {Yes/No:20286} syncope  {Yes/No:20286} dyspnea {Yes/No:20286} orthopnea  {Yes/No:20286} paroxysmal nocturnal dyspnea {Yes/No:20286} lower extremity edema   Pertinent labs Lab Results  Component Value Date   CHOL 96 03/10/2022   HDL 52 03/10/2022   LDLCALC 36 03/10/2022   TRIG 39 03/10/2022   CHOLHDL 1.8 03/10/2022   Lab Results  Component Value Date   NA 140 11/08/2021   K 4.4 11/08/2021   CREATININE 1.10 11/08/2021   EGFR 65 11/08/2021    GLUCOSE 85 11/08/2021   TSH 1.960 08/03/2020     The ASCVD Risk score (Arnett DK, et al., 2019) failed to calculate for the following reasons:   The 2019 ASCVD risk score is only valid for ages 36 to 45  ---------------------------------------------------------------------------------------------------   Medications: Outpatient Medications Prior to Visit  Medication Sig   amLODipine (NORVASC) 10 MG tablet Take 1 tablet (10 mg total) by mouth daily.   aspirin EC 81 MG tablet Take 81 mg by mouth daily.   ketoconazole (NIZORAL) 2 % cream Apply 1 Application topically daily.   losartan-hydrochlorothiazide (HYZAAR) 100-25 MG tablet TAKE 1 TABLET BY MOUTH DAILY   Multiple Vitamins-Minerals (MULTIVITAMIN ADULT PO) Take 1 tablet by mouth daily.   rosuvastatin (CRESTOR) 10 MG tablet Take 1 tablet (10 mg total) by mouth daily.   triamcinolone (KENALOG) 0.025 % cream Apply 1 Application topically 2 (two) times daily.   No facility-administered medications prior to visit.    Review of Systems  {Labs  Heme  Chem  Endocrine  Serology  Results Review (optional):23779}   Objective    There were no vitals taken for this visit. {Show previous vital signs (optional):23777}  Physical Exam  ***  No results found for any visits on 06/13/22.  Assessment & Plan     ***  No follow-ups on file.      {provider attestation***:1}   Dennis Huh, MD  [place] [phone] (phone) [phone] (fax)  [place]

## 2022-06-13 ENCOUNTER — Ambulatory Visit (INDEPENDENT_AMBULATORY_CARE_PROVIDER_SITE_OTHER): Payer: Medicare Other | Admitting: Family Medicine

## 2022-06-13 ENCOUNTER — Encounter: Payer: Self-pay | Admitting: Family Medicine

## 2022-06-13 VITALS — BP 137/67 | HR 56 | Wt 181.2 lb

## 2022-06-13 DIAGNOSIS — I10 Essential (primary) hypertension: Secondary | ICD-10-CM | POA: Diagnosis not present

## 2022-06-13 NOTE — Telephone Encounter (Signed)
Requested Prescriptions  Pending Prescriptions Disp Refills   amLODipine (NORVASC) 10 MG tablet [Pharmacy Med Name: amLODIPine Besylate 10 MG Oral Tablet] 90 tablet 0    Sig: TAKE 1 TABLET BY MOUTH DAILY     Cardiovascular: Calcium Channel Blockers 2 Passed - 06/12/2022 10:21 PM      Passed - Last BP in normal range    BP Readings from Last 1 Encounters:  06/13/22 137/67         Passed - Last Heart Rate in normal range    Pulse Readings from Last 1 Encounters:  06/13/22 (!) 79         Passed - Valid encounter within last 6 months    Recent Outpatient Visits           Today Primary hypertension   [place], Donald E, MD   1 month ago Primary hypertension   [place], Donald E, MD   5 months ago Primary hypertension   [place], Donald E, MD   7 months ago Annual physical exam   [facility] Birdie Sons, MD   1 year ago Primary hypertension   [place], Donald E, MD       Future Appointments             In 5 months Fisher, Kirstie Peri, MD [facility], [place]   In 7 months Ralene Bathe, MD [place]

## 2022-08-08 ENCOUNTER — Other Ambulatory Visit: Payer: Self-pay

## 2022-08-08 ENCOUNTER — Ambulatory Visit (INDEPENDENT_AMBULATORY_CARE_PROVIDER_SITE_OTHER): Payer: Medicare Other

## 2022-08-08 VITALS — BP 128/78 | Ht 69.0 in | Wt 178.4 lb

## 2022-08-08 DIAGNOSIS — Z0000 Encounter for general adult medical examination without abnormal findings: Secondary | ICD-10-CM | POA: Diagnosis not present

## 2022-08-08 NOTE — Patient Instructions (Signed)
[name] , Thank you for taking time to come for your Medicare Wellness Visit. I appreciate your ongoing commitment to your health goals. Please review the following plan we discussed and let me know if I can assist you in the future.   These are the goals we discussed:  Goals      DIET - EAT MORE FRUITS AND VEGETABLES        This is a list of the screening recommended for you and due dates:  Health Maintenance  Topic Date Due   Zoster (Shingles) Vaccine (2 of 2) 07/11/2020   DTaP/Tdap/Td vaccine (3 - Td or Tdap) 10/03/2021   COVID-19 Vaccine (6 - 2023-24 season) 12/18/2022*   Colon Cancer Screening  10/13/2022   Flu Shot  10/19/2022   Medicare Annual Wellness Visit  08/08/2023   Pneumonia Vaccine  Completed   HPV Vaccine  Aged Out  *Topic was postponed. The date shown is not the original due date.    Advanced directives: yes  Conditions/risks identified: low falls risk  Next appointment: Follow up in one year for your annual wellness visit. 08/14/23 @0815  in person  Preventive Care 65 Years and Older, Male  Preventive care refers to lifestyle choices and visits with your health care provider that can promote health and wellness. What does preventive care include? A yearly physical exam. This is also called an annual well check. Dental exams once or twice a year. Routine eye exams. Ask your health care provider how often you should have your eyes checked. Personal lifestyle choices, including: Daily care of your teeth and gums. Regular physical activity. Eating a healthy diet. Avoiding tobacco and drug use. Limiting alcohol use. Practicing safe sex. Taking low doses of aspirin every day. Taking vitamin and mineral supplements as recommended by your health care provider. What happens during an annual well check? The services and screenings done by your health care provider during your annual well check will depend on your age, overall health, lifestyle risk factors, and  family history of disease. Counseling  Your health care provider may ask you questions about your: Alcohol use. Tobacco use. Drug use. Emotional well-being. Home and relationship well-being. Sexual activity. Eating habits. History of falls. Memory and ability to understand (cognition). Work and work Astronomer. Screening  You may have the following tests or measurements: Height, weight, and BMI. Blood pressure. Lipid and cholesterol levels. These may be checked every 5 years, or more frequently if you are over 39 years old. Skin check. Lung cancer screening. You may have this screening every year starting at age 19 if you have a 30-pack-year history of smoking and currently smoke or have quit within the past 15 years. Fecal occult blood test (FOBT) of the stool. You may have this test every year starting at age 7. Flexible sigmoidoscopy or colonoscopy. You may have a sigmoidoscopy every 5 years or a colonoscopy every 10 years starting at age 70. Prostate cancer screening. Recommendations will vary depending on your family history and other risks. Hepatitis C blood test. Hepatitis B blood test. Sexually transmitted disease (STD) testing. Diabetes screening. This is done by checking your blood sugar (glucose) after you have not eaten for a while (fasting). You may have this done every 1-3 years. Abdominal aortic aneurysm (AAA) screening. You may need this if you are a current or former smoker. Osteoporosis. You may be screened starting at age 85 if you are at high risk. Talk with your health care provider about your test  results, treatment options, and if necessary, the need for more tests. Vaccines  Your health care provider may recommend certain vaccines, such as: Influenza vaccine. This is recommended every year. Tetanus, diphtheria, and acellular pertussis (Tdap, Td) vaccine. You may need a Td booster every 10 years. Zoster vaccine. You may need this after age 7. Pneumococcal  13-valent conjugate (PCV13) vaccine. One dose is recommended after age 68. Pneumococcal polysaccharide (PPSV23) vaccine. One dose is recommended after age 56. Talk to your health care provider about which screenings and vaccines you need and how often you need them. This information is not intended to replace advice given to you by your health care provider. Make sure you discuss any questions you have with your health care provider. Document Released: 04/02/2015 Document Revised: 11/24/2015 Document Reviewed: 01/05/2015 Elsevier Interactive Patient Education  2017 [employer].  Fall Prevention in the Home Falls can cause injuries. They can happen to people of all ages. There are many things you can do to make your home safe and to help prevent falls. What can I do on the outside of my home? Regularly fix the edges of walkways and driveways and fix any cracks. Remove anything that might make you trip as you walk through a door, such as a raised step or threshold. Trim any bushes or trees on the path to your home. Use bright outdoor lighting. Clear any walking paths of anything that might make someone trip, such as rocks or tools. Regularly check to see if handrails are loose or broken. Make sure that both sides of any steps have handrails. Any raised decks and porches should have guardrails on the edges. Have any leaves, snow, or ice cleared regularly. Use sand or salt on walking paths during winter. Clean up any spills in your garage right away. This includes oil or grease spills. What can I do in the bathroom? Use night lights. Install grab bars by the toilet and in the tub and shower. Do not use towel bars as grab bars. Use non-skid mats or decals in the tub or shower. If you need to sit down in the shower, use a plastic, non-slip stool. Keep the floor dry. Clean up any water that spills on the floor as soon as it happens. Remove soap buildup in the tub or shower regularly. Attach  bath mats securely with double-sided non-slip rug tape. Do not have throw rugs and other things on the floor that can make you trip. What can I do in the bedroom? Use night lights. Make sure that you have a light by your bed that is easy to reach. Do not use any sheets or blankets that are too big for your bed. They should not hang down onto the floor. Have a firm chair that has side arms. You can use this for support while you get dressed. Do not have throw rugs and other things on the floor that can make you trip. What can I do in the kitchen? Clean up any spills right away. Avoid walking on wet floors. Keep items that you use a lot in easy-to-reach places. If you need to reach something above you, use a strong step stool that has a grab bar. Keep electrical cords out of the way. Do not use floor polish or wax that makes floors slippery. If you must use wax, use non-skid floor wax. Do not have throw rugs and other things on the floor that can make you trip. What can I do with my stairs?  Do not leave any items on the stairs. Make sure that there are handrails on both sides of the stairs and use them. Fix handrails that are broken or loose. Make sure that handrails are as long as the stairways. Check any carpeting to make sure that it is firmly attached to the stairs. Fix any carpet that is loose or worn. Avoid having throw rugs at the top or bottom of the stairs. If you do have throw rugs, attach them to the floor with carpet tape. Make sure that you have a light switch at the top of the stairs and the bottom of the stairs. If you do not have them, ask someone to add them for you. What else can I do to help prevent falls? Wear shoes that: Do not have high heels. Have rubber bottoms. Are comfortable and fit you well. Are closed at the toe. Do not wear sandals. If you use a stepladder: Make sure that it is fully opened. Do not climb a closed stepladder. Make sure that both sides of the  stepladder are locked into place. Ask someone to hold it for you, if possible. Clearly mark and make sure that you can see: Any grab bars or handrails. First and last steps. Where the edge of each step is. Use tools that help you move around (mobility aids) if they are needed. These include: Canes. Walkers. Scooters. Crutches. Turn on the lights when you go into a dark area. Replace any light bulbs as soon as they burn out. Set up your furniture so you have a clear path. Avoid moving your furniture around. If any of your floors are uneven, fix them. If there are any pets around you, be aware of where they are. Review your medicines with your doctor. Some medicines can make you feel dizzy. This can increase your chance of falling. Ask your doctor what other things that you can do to help prevent falls. This information is not intended to replace advice given to you by your health care provider. Make sure you discuss any questions you have with your health care provider. Document Released: 12/31/2008 Document Revised: 08/12/2015 Document Reviewed: 04/10/2014 Elsevier Interactive Patient Education  2017 [employer].

## 2022-08-08 NOTE — Progress Notes (Signed)
Subjective:   Dennis Cooper is a 89 y.o. male who presents for Medicare Annual/Subsequent preventive examination.  Review of Systems    Cardiac Risk Factors include: advanced age (>30men, >59 women);dyslipidemia;hypertension;male gender    Objective:    Today's Vitals   08/08/22 0815  BP: 128/78  Weight: 178 lb 6.4 oz (80.9 kg)  Height: 5\' 9"  (1.753 m)   Body mass index is 26.35 kg/m.     08/08/2022    8:42 AM 06/28/2021    8:30 AM 06/16/2020    8:34 AM 06/10/2019    9:37 AM 05/27/2018   11:12 AM 10/12/2017    9:30 AM 05/23/2017    8:47 AM  Advanced Directives  Does Patient Have a Medical Advance Directive? Yes Yes Yes Yes Yes Yes Yes  Type of Estate agent of [place];Living will Healthcare Power of [place];Living will Healthcare Power of [place];Living will Healthcare Power of [place];Living will Healthcare Power of [place];Living will Healthcare Power of [place];Living will Healthcare Power of [place];Living will  Does patient want to make changes to medical advance directive?  Yes (Inpatient - patient defers changing a medical advance directive and declines information at this time)       Copy of Healthcare Power of Attorney in Chart?  Yes - validated most recent copy scanned in chart (See row information) Yes - validated most recent copy scanned in chart (See row information) Yes - validated most recent copy scanned in chart (See row information) No - copy requested No - copy requested No - copy requested    Current Medications (verified) Outpatient Encounter Medications as of 08/08/2022  Medication Sig   amLODipine (NORVASC) 10 MG tablet TAKE 1 TABLET BY MOUTH DAILY   aspirin EC 81 MG tablet Take 81 mg by mouth daily.   ketoconazole (NIZORAL) 2 % cream Apply 1 Application topically daily.   losartan-hydrochlorothiazide (HYZAAR) 100-25 MG tablet TAKE 1 TABLET BY MOUTH DAILY   Multiple Vitamins-Minerals (MULTIVITAMIN ADULT PO) Take 1 tablet by mouth  daily.   rosuvastatin (CRESTOR) 10 MG tablet Take 1 tablet (10 mg total) by mouth daily.   triamcinolone (KENALOG) 0.025 % cream Apply 1 Application topically 2 (two) times daily.   No facility-administered encounter medications on file as of 08/08/2022.    Allergies (verified) Patient has no known allergies.   History: Past Medical History:  Diagnosis Date   Cancer (HCC) 2007   prostate   Erectile dysfunction    History of basal cell cancer 12/27/2017   post neck - nodular   History of chicken pox    History of colonic polyps    History of measles as a child    Hx of basal cell carcinoma 09/06/2017   right medial upper eyelid - nodular   Hypertension    Left bundle branch block (LBBB)    Plantar fasciitis 10/19/2014   Precancerous lesion    Wears dentures    wears upper partial   Wears hearing aid in both ears    has both, only wears left   Past Surgical History:  Procedure Laterality Date   BLEPHAROPLASTY     03/02/2022   CATARACT EXTRACTION W/PHACO Right 03/29/2021   Procedure: CATARACT EXTRACTION PHACO AND INTRAOCULAR LENS PLACEMENT (IOC) RIGHT 4.59 00:37.1;  Surgeon: Galen Manila, MD;  Location: [facility];  Service: Ophthalmology;  Laterality: Right;   CATARACT EXTRACTION W/PHACO Left 04/12/2021   Procedure: CATARACT EXTRACTION PHACO AND INTRAOCULAR LENS PLACEMENT (IOC) LEFT 8.21 00:51.0;  Surgeon:  Galen Manila, MD;  Location: [facility];  Service: Ophthalmology;  Laterality: Left;   CHOLECYSTECTOMY  2010   Dr. Evette Cristal   COLONOSCOPY  04/2008   COLONOSCOPY WITH PROPOFOL N/A 10/12/2017   Procedure: COLONOSCOPY WITH PROPOFOL;  Surgeon: Scot Jun, MD;  Location: [facility];  Service: Endoscopy;  Laterality: N/A;   ESOPHAGOGASTRODUODENOSCOPY     HERNIA REPAIR  2010   inguinal; Dr. Evette Cristal   stomach ulcer  1971   Family History  Problem Relation Age of Onset   Heart attack Father 52   Diabetes Sister    Heart disease Sister     Diabetes Sister    Diabetes Sister    Arthritis Sister    Hypertension Sister    Heart attack Brother 32   Congestive Heart Failure Brother    Diabetes Brother    Diabetes Brother    Hypertension Brother    Arthritis Brother    Cancer Brother    Heart attack Brother 59   Social History   Socioeconomic History   Marital status: Married    Spouse name: Not on file   Number of children: 3   Years of education: Not on file   Highest education level: Some college, no degree  Occupational History   Occupation: retired  Tobacco Use   Smoking status: Former    Packs/day: 2.00    Years: 24.00    Additional pack years: 0.00    Total pack years: 48.00    Types: Cigarettes    Quit date: 1980    Years since quitting: 44.4   Smokeless tobacco: Never  Vaping Use   Vaping Use: Never used  Substance and Sexual Activity   Alcohol use: Not Currently    Comment: Quit 11/06/17   Drug use: No   Sexual activity: Not on file  Other Topics Concern   Not on file  Social History Narrative   Not on file   Social Determinants of Health   Financial Resource Strain: Low Risk  (08/08/2022)   Overall Financial Resource Strain (CARDIA)    Difficulty of Paying Living Expenses: Not hard at all  Food Insecurity: No Food Insecurity (08/08/2022)   Hunger Vital Sign    Worried About Running Out of Food in the Last Year: Never true    Ran Out of Food in the Last Year: Never true  Transportation Needs: No Transportation Needs (08/08/2022)   PRAPARE - Administrator, Civil Service (Medical): No    Lack of Transportation (Non-Medical): No  Physical Activity: Sufficiently Active (06/28/2021)   Exercise Vital Sign    Days of Exercise per Week: 5 days    Minutes of Exercise per Session: 60 min  Stress: No Stress Concern Present (08/08/2022)   [employer] of Occupational Health - Occupational Stress Questionnaire    Feeling of Stress : Not at all  Social Connections: Moderately  Isolated (08/08/2022)   Social Connection and Isolation Panel [NHANES]    Frequency of Communication with Friends and Family: Three times a week    Frequency of Social Gatherings with Friends and Family: Once a week    Attends Religious Services: Never    Database administrator or Organizations: No    Attends Engineer, structural: Never    Marital Status: Married    Tobacco Counseling Counseling given: Not Answered   Clinical Intake:  Pre-visit preparation completed: Yes  Pain : No/denies pain   BMI - recorded: 26.35 Nutritional Status:  BMI 25 -29 Overweight Nutritional Risks: None Diabetes: No  How often do you need to have someone help you when you read instructions, pamphlets, or other written materials from your doctor or pharmacy?: 1 - Never  Diabetic?no  Interpreter Needed?: No  Comments: lives with wife Information entered by :: B.Sophie Tamez,LPN   Activities of Daily Living    08/08/2022    8:35 AM 01/10/2022   11:39 AM  In your present state of health, do you have any difficulty performing the following activities:  Hearing? 1 1  Vision? 0 0  Difficulty concentrating or making decisions? 1 0  Comment forget things   Walking or climbing stairs? 0 0  Dressing or bathing? 0 0  Doing errands, shopping? 0 0  Preparing Food and eating ? N   Using the Toilet? N   In the past six months, have you accidently leaked urine? N   Do you have problems with loss of bowel control? N   Managing your Medications? N   Managing your Finances? N   Housekeeping or managing your Housekeeping? N     Patient Care Team: Malva Limes, MD as PCP - General (Family Medicine) End, Cristal Deer, MD as PCP - Cardiology (Cardiology) Deirdre Evener, MD (Dermatology) [facility], [facility]) [place], [place], [place] (Optometry) Lemar Livings, Merrily Pew, MD as Consulting Physician (General Surgery)  Indicate any recent Medical Services you may have received from other  than [facility] providers in the past year (date may be approximate).     Assessment:   This is a routine wellness examination for [facility].  Hearing/Vision screen Hearing Screening - Comments:: Adequate hearing w/bilateral hearing aides  Says not working well:taking back Vision Screening - Comments:: Adequate vision-had cataract bilateral surgery;readers only Dr Lillie Fragmin  Dietary issues and exercise activities discussed: Current Exercise Habits: Home exercise routine;Structured exercise class, Type of exercise: treadmill;walking;strength training/weights;calisthenics;stretching, Time (Minutes): > 60, Frequency (Times/Week): 7, Weekly Exercise (Minutes/Week): 0, Intensity: Moderate, Exercise limited by: cardiac condition(s)   Goals Addressed             This Visit's Progress    DIET - EAT MORE FRUITS AND VEGETABLES   On track      Depression Screen    08/08/2022    8:30 AM 01/10/2022   11:39 AM 11/08/2021    2:08 PM 06/28/2021    8:28 AM 06/16/2020    8:31 AM 06/10/2019    9:33 AM 05/27/2018   11:16 AM  PHQ 2/9 Scores  PHQ - 2 Score 0 0 0 0 0 0 0  PHQ- 9 Score  1 2    0    Fall Risk    08/08/2022    8:29 AM 01/10/2022   11:39 AM 11/08/2021    2:09 PM 06/28/2021    8:32 AM 06/16/2020    8:34 AM  Fall Risk   Falls in the past year? 0 1 0 0 0  Number falls in past yr: 0 0 0 0 0  Injury with Fall? 0 1 0 0 0  Risk for fall due to : No Fall Risks  No Fall Risks    Follow up Education provided;Falls prevention discussed  Falls evaluation completed Falls evaluation completed     FALL RISK PREVENTION PERTAINING TO THE HOME:  Any stairs in or around the home? No  If so, are there any without handrails? No  Home free of loose throw rugs in walkways, pet beds, electrical cords, etc? Yes  Adequate  lighting in your home to reduce risk of falls? Yes   ASSISTIVE DEVICES UTILIZED TO PREVENT FALLS:  Life alert? No  Use of a cane, walker or w/c? No  Grab bars in the bathroom? No   Shower chair or bench in shower? No  Elevated toilet seat or a handicapped toilet? No   TIMED UP AND GO:  Was the test performed? Yes .  Length of time to ambulate 10 feet: 10 sec.   Gait steady and fast without use of assistive device  Cognitive Function:        08/08/2022    8:36 AM 06/10/2019    9:42 AM 05/27/2018   11:16 AM 05/23/2017    8:52 AM  6CIT Screen  What Year? 0 points 0 points 0 points 0 points  What month? 0 points 0 points 0 points 0 points  What time? 0 points 0 points 0 points 0 points  Count back from 20 0 points 0 points 0 points 0 points  Months in reverse 0 points 2 points 0 points 0 points  Repeat phrase 0 points 2 points 2 points 2 points  Total Score 0 points 4 points 2 points 2 points    Immunizations Immunization History  Administered Date(s) Administered   COVID-19, mRNA, vaccine(Comirnaty)12 years and older 01/11/2022   Fluad Quad(high Dose 65+) 12/25/2018, 12/18/2019, 01/12/2021, 01/10/2022   Influenza Split 01/11/2011, 12/26/2011   Influenza, High Dose Seasonal PF 12/23/2013, 02/04/2015, 12/18/2015, 12/21/2016, 01/10/2018   Influenza,inj,Quad PF,6+ Mos 12/07/2012   PFIZER(Purple Top)SARS-COV-2 Vaccination 03/25/2019, 04/15/2019, 01/01/2020   Pfizer Covid-19 Vaccine Bivalent Booster 46yrs & up 12/18/2020   Pneumococcal Conjugate-13 05/02/2013   Pneumococcal Polysaccharide-23 06/22/2005   Td 03/20/2004   Tdap 10/04/2011   Zoster Recombinat (Shingrix) 05/16/2020   Zoster, Live 10/04/2011    TDAP status: Up to date  Flu Vaccine status: Up to date  Pneumococcal vaccine status: Up to date  Covid-19 vaccine status: Completed vaccines  Qualifies for Shingles Vaccine? Yes   Zostavax completed Yes   Shingrix Completed?: Yes  Screening Tests Health Maintenance  Topic Date Due   Zoster Vaccines- Shingrix (2 of 2) 07/11/2020   DTaP/Tdap/Td (3 - Td or Tdap) 10/03/2021   COVID-19 Vaccine (6 - 2023-24 season) 12/18/2022 (Originally  03/08/2022)   COLONOSCOPY (Pts 45-55yrs Insurance coverage will need to be confirmed)  10/13/2022   INFLUENZA VACCINE  10/19/2022   Medicare Annual Wellness (AWV)  08/08/2023   Pneumonia Vaccine 64+ Years old  Completed   HPV VACCINES  Aged Out    Health Maintenance  Health Maintenance Due  Topic Date Due   Zoster Vaccines- Shingrix (2 of 2) 07/11/2020   DTaP/Tdap/Td (3 - Td or Tdap) 10/03/2021    Colorectal cancer screening: No longer required.   Lung Cancer Screening: (Low Dose CT Chest recommended if Age 20-80 years, 30 pack-year currently smoking OR have quit w/in 15years.) does not qualify.   Lung Cancer Screening Referral: no  Additional Screening:  Hepatitis C Screening: does not qualify; Completed yes  Vision Screening: Recommended annual ophthalmology exams for early detection of glaucoma and other disorders of the eye. Is the patient up to date with their annual eye exam?  Yes  Who is the provider or what is the name of the office in which the patient attends annual eye exams? Dr Alinda Money If pt is not established with a provider, would they like to be referred to a provider to establish care? No .   Dental Screening: Recommended  annual dental exams for proper oral hygiene  Community Resource Referral / Chronic Care Management: CRR required this visit?  No   CCM required this visit?  No    Plan:     I have personally reviewed and noted the following in the patient's chart:   Medical and social history Use of alcohol, tobacco or illicit drugs  Current medications and supplements including opioid prescriptions. Patient is not currently taking opioid prescriptions. Functional ability and status Nutritional status Physical activity Advanced directives List of other physicians Hospitalizations, surgeries, and ER visits in previous 12 months Vitals Screenings to include cognitive, depression, and falls Referrals and appointments  In addition, I have  reviewed and discussed with patient certain preventive protocols, quality metrics, and best practice recommendations. A written personalized care plan for preventive services as well as general preventive health recommendations were provided to patient.     Sue Lush, LPN   1/61/0960   Nurse Notes: pt is doing very well as he remains active walking 4 miles a day and working out at the gym 3 days weekly.  He has no concerns or questions but desires refills of the two medication creams on his profile.

## 2022-08-10 MED ORDER — TRIAMCINOLONE ACETONIDE 0.025 % EX CREA: 1.0000 | TOPICAL_CREAM | Freq: Two times a day (BID) | CUTANEOUS | 2 refills | Status: DC

## 2022-08-10 MED ORDER — KETOCONAZOLE 2 % EX CREA: 1.0000 | TOPICAL_CREAM | Freq: Every day | CUTANEOUS | 2 refills | Status: DC

## 2022-08-14 ENCOUNTER — Other Ambulatory Visit: Payer: Self-pay | Admitting: Family Medicine

## 2022-08-14 DIAGNOSIS — I10 Essential (primary) hypertension: Secondary | ICD-10-CM

## 2022-10-17 ENCOUNTER — Other Ambulatory Visit: Payer: Self-pay | Admitting: Family Medicine

## 2022-10-17 DIAGNOSIS — I10 Essential (primary) hypertension: Secondary | ICD-10-CM

## 2022-10-17 NOTE — Telephone Encounter (Signed)
Requested Prescriptions  Pending Prescriptions Disp Refills   amLODipine (NORVASC) 10 MG tablet [Pharmacy Med Name: amLODIPine Besylate 10 MG Oral Tablet] 90 tablet 3    Sig: TAKE 1 TABLET BY MOUTH DAILY     Cardiovascular: Calcium Channel Blockers 2 Passed - 10/17/2022  4:48 AM      Passed - Last BP in normal range    BP Readings from Last 1 Encounters:  08/08/22 128/78         Passed - Last Heart Rate in normal range    Pulse Readings from Last 1 Encounters:  06/13/22 (!) 56         Passed - Valid encounter within last 6 months    Recent Outpatient Visits           4 months ago Primary hypertension   [facility] Malva Limes, MD   6 months ago Primary hypertension   [facility] Malva Limes, MD   9 months ago Primary hypertension   [facility] Malva Limes, MD   11 months ago Annual physical exam   [facility], [facility]   2 years ago Primary hypertension   [facility] Malva Limes, MD       Future Appointments             In 4 weeks Fisher, Demetrios Isaacs, MD [facility]   In 3 months Deirdre Evener, MD [facility]

## 2022-11-15 ENCOUNTER — Encounter: Payer: Self-pay | Admitting: Family Medicine

## 2022-11-15 ENCOUNTER — Ambulatory Visit (INDEPENDENT_AMBULATORY_CARE_PROVIDER_SITE_OTHER): Payer: Medicare Other | Admitting: Family Medicine

## 2022-11-15 VITALS — BP 121/69 | HR 55 | Temp 97.6°F | Resp 12 | Ht 69.0 in | Wt 171.5 lb

## 2022-11-15 DIAGNOSIS — I10 Essential (primary) hypertension: Secondary | ICD-10-CM

## 2022-11-15 DIAGNOSIS — Z0000 Encounter for general adult medical examination without abnormal findings: Secondary | ICD-10-CM

## 2022-11-15 DIAGNOSIS — Z0001 Encounter for general adult medical examination with abnormal findings: Secondary | ICD-10-CM

## 2022-11-15 DIAGNOSIS — Z23 Encounter for immunization: Secondary | ICD-10-CM | POA: Diagnosis not present

## 2022-11-15 DIAGNOSIS — E785 Hyperlipidemia, unspecified: Secondary | ICD-10-CM | POA: Diagnosis not present

## 2022-11-15 MED ORDER — TADALAFIL 5 MG PO TABS: 5.0000 mg | ORAL_TABLET | Freq: Every day | ORAL | 11 refills | Status: DC

## 2022-11-15 NOTE — Progress Notes (Signed)
Complete physical exam   Patient: Dennis Cooper   DOB: 08-Dec-1932   89 y.o. Male  MRN: [id number] Visit Date: 11/15/2022  Today's healthcare provider: Mila Merry, MD   Chief Complaint  Patient presents with   Annual Exam   Subjective    Dennis Cooper is a 89 y.o. male who presents today for a complete physical exam.  He reports consuming a  healthy low sugar  diet. Hiking and going to gym several days a week, and balance classes.  He generally feels well. He reports sleeping fairly well. He does not have additional problems to discuss today.   He is also due to follow up on hypertension and lipids, doing well on current medications with no side effects.   Past Medical History:  Diagnosis Date   Cancer [facility]) 2007   prostate   Erectile dysfunction    History of basal cell cancer 12/27/2017   post neck - nodular   History of chicken pox    History of colonic polyps    History of measles as a child    Hx of basal cell carcinoma 09/06/2017   right medial upper eyelid - nodular   Hypertension    Left bundle branch block (LBBB)    Plantar fasciitis 10/19/2014   Precancerous lesion    Wears dentures    wears upper partial   Wears hearing aid in both ears    has both, only wears left   Past Surgical History:  Procedure Laterality Date   BLEPHAROPLASTY     03/02/2022   CATARACT EXTRACTION W/PHACO Right 03/29/2021   Procedure: CATARACT EXTRACTION PHACO AND INTRAOCULAR LENS PLACEMENT (IOC) RIGHT 4.59 00:37.1;  Surgeon: Galen Manila, MD;  Location: [facility];  Service: Ophthalmology;  Laterality: Right;   CATARACT EXTRACTION W/PHACO Left 04/12/2021   Procedure: CATARACT EXTRACTION PHACO AND INTRAOCULAR LENS PLACEMENT (IOC) LEFT 8.21 00:51.0;  Surgeon: Galen Manila, MD;  Location: [facility];  Service: Ophthalmology;  Laterality: Left;   CHOLECYSTECTOMY  2010   Dr. Evette Cristal   COLONOSCOPY  04/2008   COLONOSCOPY WITH PROPOFOL N/A 10/12/2017    Procedure: COLONOSCOPY WITH PROPOFOL;  Surgeon: Scot Jun, MD;  Location: [facility];  Service: Endoscopy;  Laterality: N/A;   ESOPHAGOGASTRODUODENOSCOPY     HERNIA REPAIR  2010   inguinal; Dr. Evette Cristal   stomach ulcer  1971   Social History   Socioeconomic History   Marital status: Married    Spouse name: Not on file   Number of children: 3   Years of education: Not on file   Highest education level: Some college, no degree  Occupational History   Occupation: retired  Tobacco Use   Smoking status: Former    Current packs/day: 0.00    Average packs/day: 2.0 packs/day for 24.0 years (48.0 ttl pk-yrs)    Types: Cigarettes    Start date: 40    Quit date: 1980    Years since quitting: 44.6   Smokeless tobacco: Never  Vaping Use   Vaping status: Never Used  Substance and Sexual Activity   Alcohol use: Not Currently    Comment: Quit 11/06/17   Drug use: No   Sexual activity: Not on file  Other Topics Concern   Not on file  Social History Narrative   Not on file   Social Determinants of Health   Financial Resource Strain: Low Risk  (08/08/2022)   Overall Financial Resource Strain (CARDIA)    Difficulty  of Paying Living Expenses: Not hard at all  Food Insecurity: No Food Insecurity (08/08/2022)   Hunger Vital Sign    Worried About Running Out of Food in the Last Year: Never true    Ran Out of Food in the Last Year: Never true  Transportation Needs: No Transportation Needs (08/08/2022)   PRAPARE - Administrator, Civil Service (Medical): No    Lack of Transportation (Non-Medical): No  Physical Activity: Sufficiently Active (06/28/2021)   Exercise Vital Sign    Days of Exercise per Week: 5 days    Minutes of Exercise per Session: 60 min  Stress: No Stress Concern Present (08/08/2022)   [employer] of Occupational Health - Occupational Stress Questionnaire    Feeling of Stress : Not at all  Social Connections: Moderately Isolated (08/08/2022)    Social Connection and Isolation Panel [NHANES]    Frequency of Communication with Friends and Family: Three times a week    Frequency of Social Gatherings with Friends and Family: Once a week    Attends Religious Services: Never    Database administrator or Organizations: No    Attends Banker Meetings: Never    Marital Status: Married  Catering manager Violence: Not At Risk (08/08/2022)   Humiliation, Afraid, Rape, and Kick questionnaire    Fear of Current or Ex-Partner: No    Emotionally Abused: No    Physically Abused: No    Sexually Abused: No   Family Status  Relation Name Status   Mother  Deceased at age 97       natural causes   Father  Deceased at age 87       MI   Sister  Deceased at age 55       Fire accident   Sister  Deceased   Sister  Deceased   Brother  Deceased at age 35   Brother  Alive       had a quad-bypass  No partnership data on file   Family History  Problem Relation Age of Onset   Heart attack Father 32   Diabetes Sister    Heart disease Sister    Diabetes Sister    Diabetes Sister    Arthritis Sister    Hypertension Sister    Heart attack Brother 6   Congestive Heart Failure Brother    Diabetes Brother    Diabetes Brother    Hypertension Brother    Arthritis Brother    Cancer Brother    Heart attack Brother 8   No Known Allergies  Patient Care Team: Malva Limes, MD as PCP - General (Family Medicine) End, Cristal Deer, MD as PCP - Cardiology (Cardiology) Deirdre Evener, MD (Dermatology) [place] (Optometry) [place], [place], OD (Optometry) [place], Merrily Pew, MD as Consulting Physician (General Surgery)   Medications: Outpatient Medications Prior to Visit  Medication Sig   amLODipine (NORVASC) 10 MG tablet TAKE 1 TABLET BY MOUTH DAILY   aspirin EC 81 MG tablet Take 81 mg by mouth daily.   ketoconazole (NIZORAL) 2 % cream Apply 1 Application topically daily.   losartan-hydrochlorothiazide (HYZAAR)  100-25 MG tablet TAKE 1 TABLET BY MOUTH DAILY   Multiple Vitamins-Minerals (MULTIVITAMIN ADULT PO) Take 1 tablet by mouth daily.   rosuvastatin (CRESTOR) 10 MG tablet Take 1 tablet (10 mg total) by mouth daily.   triamcinolone (KENALOG) 0.025 % cream Apply 1 Application topically 2 (two) times daily.   No facility-administered medications prior  to visit.    Review of Systems  Constitutional:  Negative for chills, diaphoresis and fever.  HENT:  Negative for congestion, ear discharge, ear pain, hearing loss, nosebleeds, sore throat and tinnitus.   Eyes:  Negative for photophobia, pain, discharge and redness.  Respiratory:  Negative for cough, shortness of breath, wheezing and stridor.   Cardiovascular:  Negative for chest pain, palpitations and leg swelling.  Gastrointestinal:  Negative for abdominal pain, blood in stool, constipation, diarrhea, nausea and vomiting.  Endocrine: Negative for polydipsia.  Genitourinary:  Negative for dysuria, flank pain, frequency, hematuria and urgency.  Musculoskeletal:  Negative for back pain, myalgias and neck pain.  Skin:  Negative for rash.  Allergic/Immunologic: Negative for environmental allergies.  Neurological:  Negative for dizziness, tremors, seizures, weakness and headaches.  Hematological:  Does not bruise/bleed easily.  Psychiatric/Behavioral:  Negative for hallucinations and suicidal ideas. The patient is not nervous/anxious.     Objective    BP 121/69 (BP Location: Left Arm, Patient Position: Sitting, Cuff Size: Normal)   Pulse (!) 55   Temp 97.6 F (36.4 C) (Temporal)   Resp 12   Ht 5\' 9"  (1.753 m)   Wt 171 lb 8 oz (77.8 kg)   SpO2 97%   BMI 25.33 kg/m   Physical Exam   General Appearance:    Well developed, well nourished male. Alert, cooperative, in no acute distress, appears stated age  Head:    Normocephalic, without obvious abnormality, atraumatic  Eyes:    PERRL, conjunctiva/corneas clear, EOM's intact, fundi    benign,  both eyes       Ears:    Normal TM's and external ear canals, both ears  Nose:   Nares normal, septum midline, mucosa normal, no drainage   or sinus tenderness  Throat:   Lips, mucosa, and tongue normal; teeth and gums normal  Neck:   Supple, symmetrical, trachea midline, no adenopathy;       thyroid:  No enlargement/tenderness/nodules; no carotid   bruit or JVD  Back:     Symmetric, no curvature, ROM normal, no CVA tenderness  Lungs:     Clear to auscultation bilaterally, respirations unlabored  Chest wall:    No tenderness or deformity  Heart:    Bradycardic. Normal rhythm. No murmurs, rubs, or gallops.  S1 and S2 normal  Abdomen:     Soft, non-tender, bowel sounds active all four quadrants,    no masses, no organomegaly  Genitalia:    deferred  Rectal:    deferred  Extremities:   All extremities are intact. No cyanosis or edema  Pulses:   2+ and symmetric all extremities  Skin:   Skin color, texture, turgor normal, no rashes or lesions  Lymph nodes:   Cervical, supraclavicular, and axillary nodes normal  Neurologic:   CNII-XII intact. Normal strength, sensation and reflexes      throughout     Last depression screening scores    11/15/2022    9:10 AM 08/08/2022    8:30 AM 01/10/2022   11:39 AM  PHQ 2/9 Scores  PHQ - 2 Score 0 0 0  PHQ- 9 Score 2  1   Last fall risk screening    11/15/2022    9:10 AM  Fall Risk   Falls in the past year? 0  Number falls in past yr: 0  Injury with Fall? 0  Risk for fall due to : No Fall Risks  Follow up Falls evaluation completed   Last Audit-C  alcohol use screening    01/10/2022   11:39 AM  Alcohol Use Disorder Test (AUDIT)  1. How often do you have a drink containing alcohol? 0  2. How many drinks containing alcohol do you have on a typical day when you are drinking? 0  3. How often do you have six or more drinks on one occasion? 0  AUDIT-C Score 0   A score of 3 or more in women, and 4 or more in men indicates increased risk  for alcohol abuse, EXCEPT if all of the points are from question 1   No results found for any visits on 11/15/22.  Assessment & Plan    Routine Health Maintenance and Physical Exam  Exercise Activities and Dietary recommendations  Goals      DIET - EAT MORE FRUITS AND VEGETABLES        Immunization History  Administered Date(s) Administered   COVID-19, mRNA, vaccine(Comirnaty)12 years and older 01/11/2022   Fluad Quad(high Dose 65+) 12/25/2018, 12/18/2019, 01/12/2021, 01/10/2022   Influenza Split 01/11/2011, 12/26/2011   Influenza, High Dose Seasonal PF 12/23/2013, 02/04/2015, 12/18/2015, 12/21/2016, 01/10/2018   Influenza,inj,Quad PF,6+ Mos 12/07/2012   PFIZER(Purple Top)SARS-COV-2 Vaccination 03/25/2019, 04/15/2019, 01/01/2020   Pfizer Covid-19 Vaccine Bivalent Booster 49yrs & up 12/18/2020   Pneumococcal Conjugate-13 05/02/2013   Pneumococcal Polysaccharide-23 06/22/2005   Td 03/20/2004   Tdap 10/04/2011   Zoster Recombinant(Shingrix) 05/16/2020   Zoster, Live 10/04/2011    Health Maintenance  Topic Date Due   Zoster Vaccines- Shingrix (2 of 2) 07/11/2020   DTaP/Tdap/Td (3 - Td or Tdap) 10/03/2021   Colonoscopy  10/13/2022   INFLUENZA VACCINE  10/19/2022   COVID-19 Vaccine (6 - 2023-24 season) 12/18/2022 (Originally 03/08/2022)   Medicare Annual Wellness (AWV)  08/08/2023   Pneumonia Vaccine 58+ Years old  Completed   HPV VACCINES  Aged Out    Discussed health benefits of physical activity, and encouraged him to engage in regular exercise appropriate for his age and condition.  1. Annual physical exam   2. Primary hypertension Well controlled. Continue current medications.     3. Hyperlipidemia LDL goal <70  - CBC - Comprehensive metabolic panel - Lipid panel  4. Influenza vaccine needed  - Flu Vaccine Trivalent High Dose (Fluad)  Other orders - tadalafil (CIALIS) 5 MG tablet; Take 1 tablet (5 mg total) by mouth daily.  Dispense: 30 tablet; Refill:  11        Mila Merry, MD  [facility] [phone] (phone) [phone] (fax)  [facility]

## 2022-11-16 LAB — LIPID PANEL
Chol/HDL Ratio: 1.6 ratio (ref 0.0–5.0)
Cholesterol, Total: 100 mg/dL (ref 100–199)
HDL: 63 mg/dL (ref >39)
LDL Chol Calc (NIH): 24 mg/dL (ref 0–99)
Triglycerides: 51 mg/dL (ref 0–149)
VLDL Cholesterol Cal: 13 mg/dL (ref 5–40)

## 2022-11-16 LAB — COMPREHENSIVE METABOLIC PANEL
ALT: 32 IU/L (ref 0–44)
AST: 37 IU/L (ref 0–40)
Albumin: 4.4 g/dL (ref 3.7–4.7)
Alkaline Phosphatase: 65 IU/L (ref 44–121)
BUN/Creatinine Ratio: 20 (ref 10–24)
BUN: 17 mg/dL (ref 8–27)
Bilirubin Total: 0.7 mg/dL (ref 0.0–1.2)
CO2: 23 mmol/L (ref 20–29)
Calcium: 9.3 mg/dL (ref 8.6–10.2)
Chloride: 102 mmol/L (ref 96–106)
Creatinine, Ser: 0.86 mg/dL (ref 0.76–1.27)
Globulin, Total: 1.9 g/dL (ref 1.5–4.5)
Glucose: 90 mg/dL (ref 70–99)
Potassium: 4.2 mmol/L (ref 3.5–5.2)
Sodium: 138 mmol/L (ref 134–144)
Total Protein: 6.3 g/dL (ref 6.0–8.5)
eGFR: 84 mL/min/{1.73_m2} (ref >59)

## 2022-11-16 LAB — CBC
Hematocrit: 45.3 % (ref 37.5–51.0)
Hemoglobin: 15 g/dL (ref 13.0–17.7)
MCH: 30.8 pg (ref 26.6–33.0)
MCHC: 33.1 g/dL (ref 31.5–35.7)
MCV: 93 fL (ref 79–97)
Platelets: 167 10*3/uL (ref 150–450)
RBC: 4.87 x10E6/uL (ref 4.14–5.80)
RDW: 12.7 % (ref 11.6–15.4)
WBC: 7.4 10*3/uL (ref 3.4–10.8)

## 2022-12-23 ENCOUNTER — Other Ambulatory Visit: Payer: Self-pay | Admitting: Family Medicine

## 2022-12-25 NOTE — Telephone Encounter (Signed)
Requested Prescriptions  Pending Prescriptions Disp Refills   rosuvastatin (CRESTOR) 10 MG tablet [Pharmacy Med Name: ROSUVASTATIN CALCIUM 10 MG TAB] 90 tablet 3    Sig: TAKE 1 TABLET BY MOUTH EVERY DAY     Cardiovascular:  Antilipid - Statins 2 Failed - 12/23/2022  8:48 AM      Failed - Lipid Panel in normal range within the last 12 months    Cholesterol, Total  Date Value Ref Range Status  11/15/2022 100 100 - 199 mg/dL Final   LDL Chol Calc (NIH)  Date Value Ref Range Status  11/15/2022 24 0 - 99 mg/dL Final   HDL  Date Value Ref Range Status  11/15/2022 63 >39 mg/dL Final   Triglycerides  Date Value Ref Range Status  11/15/2022 51 0 - 149 mg/dL Final         Passed - Cr in normal range and within 360 days    Creatinine, Ser  Date Value Ref Range Status  11/15/2022 0.86 0.76 - 1.27 mg/dL Final         Passed - Patient is not pregnant      Passed - Valid encounter within last 12 months    Recent Outpatient Visits           1 month ago Annual physical exam   [facility] Malva Limes, MD   6 months ago Primary hypertension   [facility] Malva Limes, MD   8 months ago Primary hypertension   [facility] Malva Limes, MD   11 months ago Primary hypertension   [facility] Malva Limes, MD   1 year ago Annual physical exam   [facility] Malva Limes, MD       Future Appointments             In 1 month Deirdre Evener, MD [facility]   In 11 months Fisher, Demetrios Isaacs, MD [facility]

## 2023-02-01 ENCOUNTER — Ambulatory Visit: Payer: Medicare Other | Admitting: Dermatology

## 2023-02-01 DIAGNOSIS — D1801 Hemangioma of skin and subcutaneous tissue: Secondary | ICD-10-CM

## 2023-02-01 DIAGNOSIS — W908XXA Exposure to other nonionizing radiation, initial encounter: Secondary | ICD-10-CM

## 2023-02-01 DIAGNOSIS — L570 Actinic keratosis: Secondary | ICD-10-CM

## 2023-02-01 DIAGNOSIS — Z1283 Encounter for screening for malignant neoplasm of skin: Secondary | ICD-10-CM

## 2023-02-01 DIAGNOSIS — L820 Inflamed seborrheic keratosis: Secondary | ICD-10-CM

## 2023-02-01 DIAGNOSIS — L814 Other melanin hyperpigmentation: Secondary | ICD-10-CM

## 2023-02-01 DIAGNOSIS — D229 Melanocytic nevi, unspecified: Secondary | ICD-10-CM

## 2023-02-01 DIAGNOSIS — Z85828 Personal history of other malignant neoplasm of skin: Secondary | ICD-10-CM

## 2023-02-01 DIAGNOSIS — L821 Other seborrheic keratosis: Secondary | ICD-10-CM

## 2023-02-01 DIAGNOSIS — L578 Other skin changes due to chronic exposure to nonionizing radiation: Secondary | ICD-10-CM | POA: Diagnosis not present

## 2023-02-01 NOTE — Progress Notes (Signed)
Follow-Up Visit   Subjective  Dennis Cooper is a 89 y.o. male who presents for the following: Skin Cancer Screening and Full Body Skin Exam  The patient presents for Total-Body Skin Exam (TBSE) for skin cancer screening and mole check. The patient has spots, moles and lesions to be evaluated, some may be new or changing and the patient may have concern these could be cancer.    The following portions of the chart were reviewed this encounter and updated as appropriate: medications, allergies, medical history  Review of Systems:  No other skin or systemic complaints except as noted in HPI or Assessment and Plan.  Objective  Well appearing patient in no apparent distress; mood and affect are within normal limits.  A full examination was performed including scalp, head, eyes, ears, nose, lips, neck, chest, axillae, abdomen, back, buttocks, bilateral upper extremities, bilateral lower extremities, hands, feet, fingers, toes, fingernails, and toenails. All findings within normal limits unless otherwise noted below.   Relevant physical exam findings are noted in the Assessment and Plan.  Face x 11 (11) Erythematous thin papules/macules with gritty scale.   R mid back x 1, abdomen x 1, R calf x 1 (3) Stuck on waxy paps with erythema    Assessment & Plan   SKIN CANCER SCREENING PERFORMED TODAY.  ACTINIC DAMAGE - Chronic condition, secondary to cumulative UV/sun exposure - diffuse scaly erythematous macules with underlying dyspigmentation - Recommend daily broad spectrum sunscreen SPF 30+ to sun-exposed areas, reapply every 2 hours as needed.  - Staying in the shade or wearing long sleeves, sun glasses (UVA+UVB protection) and wide brim hats (4-inch brim around the entire circumference of the hat) are also recommended for sun protection.  - Call for new or changing lesions.  LENTIGINES, SEBORRHEIC KERATOSES, HEMANGIOMAS - Benign normal skin lesions - Benign-appearing - Call for any  changes  MELANOCYTIC NEVI - Tan-brown and/or pink-flesh-colored symmetric macules and papules - Benign appearing on exam today - Observation - Call clinic for new or changing moles - Recommend daily use of broad spectrum spf 30+ sunscreen to sun-exposed areas.   HISTORY OF BASAL CELL CARCINOMA OF THE SKIN - No evidence of recurrence today - Recommend regular full body skin exams - Recommend daily broad spectrum sunscreen SPF 30+ to sun-exposed areas, reapply every 2 hours as needed.  - Call if any new or changing lesions are noted between office visits  AK (actinic keratosis) (11) Face x 11  Actinic keratoses are precancerous spots that appear secondary to cumulative UV radiation exposure/sun exposure over time. They are chronic with expected duration over 1 year. A portion of actinic keratoses will progress to squamous cell carcinoma of the skin. It is not possible to reliably predict which spots will progress to skin cancer and so treatment is recommended to prevent development of skin cancer.  Recommend daily broad spectrum sunscreen SPF 30+ to sun-exposed areas, reapply every 2 hours as needed.  Recommend staying in the shade or wearing long sleeves, sun glasses (UVA+UVB protection) and wide brim hats (4-inch brim around the entire circumference of the hat). Call for new or changing lesions.   Destruction of lesion - Face x 11 (11) Complexity: simple   Destruction method: cryotherapy   Informed consent: discussed and consent obtained   Timeout:  patient name, date of birth, surgical site, and procedure verified Lesion destroyed using liquid nitrogen: Yes   Region frozen until ice ball extended beyond lesion: Yes   Outcome: patient tolerated  procedure well with no complications   Post-procedure details: wound care instructions given    Inflamed seborrheic keratosis (3) R mid back x 1, abdomen x 1, R calf x 1  Symptomatic, irritating, patient would like  treated.   Destruction of lesion - R mid back x 1, abdomen x 1, R calf x 1 (3) Complexity: simple   Destruction method: cryotherapy   Informed consent: discussed and consent obtained   Timeout:  patient name, date of birth, surgical site, and procedure verified Lesion destroyed using liquid nitrogen: Yes   Region frozen until ice ball extended beyond lesion: Yes   Outcome: patient tolerated procedure well with no complications   Post-procedure details: wound care instructions given     Return in about 1 year (around 02/01/2024) for TBSE.  Maylene Roes, CMA, am acting as scribe for Armida Sans, MD .   Documentation: I have reviewed the above documentation for accuracy and completeness, and I agree with the above.  Armida Sans, MD

## 2023-02-01 NOTE — Patient Instructions (Signed)

## 2023-02-10 ENCOUNTER — Encounter: Payer: Self-pay | Admitting: Dermatology

## 2023-03-13 ENCOUNTER — Other Ambulatory Visit: Payer: Self-pay | Admitting: Family Medicine

## 2023-03-13 DIAGNOSIS — I10 Essential (primary) hypertension: Secondary | ICD-10-CM

## 2023-05-06 ENCOUNTER — Other Ambulatory Visit: Payer: Self-pay | Admitting: Family Medicine

## 2023-05-06 DIAGNOSIS — I10 Essential (primary) hypertension: Secondary | ICD-10-CM

## 2023-05-07 NOTE — Telephone Encounter (Signed)
Requested Prescriptions  Pending Prescriptions Disp Refills   losartan-hydrochlorothiazide (HYZAAR) 100-25 MG tablet [Pharmacy Med Name: Losartan Potassium-HCTZ 100-25 MG Oral Tablet] 90 tablet 3    Sig: TAKE 1 TABLET BY MOUTH DAILY     Cardiovascular: ARB + Diuretic Combos Passed - 05/07/2023  3:22 PM      Passed - K in normal range and within 180 days    Potassium  Date Value Ref Range Status  11/15/2022 4.2 3.5 - 5.2 mmol/L Final         Passed - Na in normal range and within 180 days    Sodium  Date Value Ref Range Status  11/15/2022 138 134 - 144 mmol/L Final         Passed - Cr in normal range and within 180 days    Creatinine, Ser  Date Value Ref Range Status  11/15/2022 0.86 0.76 - 1.27 mg/dL Final         Passed - eGFR is 10 or above and within 180 days    GFR calc Af Amer  Date Value Ref Range Status  06/10/2019 88 >59 mL/min/1.73 Final   GFR calc non Af Amer  Date Value Ref Range Status  06/10/2019 76 >59 mL/min/1.73 Final   eGFR  Date Value Ref Range Status  11/15/2022 84 >59 mL/min/1.73 Final         Passed - Patient is not pregnant      Passed - Last BP in normal range    BP Readings from Last 1 Encounters:  11/15/22 121/69         Passed - Valid encounter within last 6 months    Recent Outpatient Visits           5 months ago Annual physical exam   [facility] Malva Limes, MD   10 months ago Primary hypertension   [facility] Malva Limes, MD   1 year ago Primary hypertension   [facility] MD   1 year ago Primary hypertension   [place] [facility] Malva Limes, MD   1 year ago Annual physical exam   [facility] Malva Limes, MD       Future Appointments             In 6 months Fisher, Demetrios Isaacs, MD [facility]   In 9 months Deirdre Evener, MD  [facility]

## 2023-05-15 ENCOUNTER — Other Ambulatory Visit: Payer: Self-pay | Admitting: Family Medicine

## 2023-05-15 DIAGNOSIS — I10 Essential (primary) hypertension: Secondary | ICD-10-CM

## 2023-07-03 ENCOUNTER — Ambulatory Visit: Attending: Cardiology | Admitting: Cardiology

## 2023-07-03 ENCOUNTER — Encounter: Payer: Self-pay | Admitting: Cardiology

## 2023-07-03 VITALS — BP 118/60 | HR 62 | Ht 69.0 in | Wt 170.0 lb

## 2023-07-03 DIAGNOSIS — E785 Hyperlipidemia, unspecified: Secondary | ICD-10-CM | POA: Diagnosis not present

## 2023-07-03 DIAGNOSIS — I447 Left bundle-branch block, unspecified: Secondary | ICD-10-CM | POA: Diagnosis not present

## 2023-07-03 DIAGNOSIS — I2510 Atherosclerotic heart disease of native coronary artery without angina pectoris: Secondary | ICD-10-CM

## 2023-07-03 DIAGNOSIS — I10 Essential (primary) hypertension: Secondary | ICD-10-CM

## 2023-07-03 NOTE — Progress Notes (Signed)
 Cardiology Office Note:  .   Date:  07/03/2023  ID:  Dennis Cooper, DOB 01/16/1933, MRN [id number] PCP: Lamon Pillow, MD  [facility] Providers Cardiologist:  Sammy Crisp, MD    History of Present Illness: .   Dennis Cooper is a 89 y.o. male with past medical history of coronary artery calcification, hypertension, chronic left bundle branch block, prostate cancer, who presents today for follow-up of his coronary artery disease.  Previously undergone coronary calcium score in which revealed severe calcification with a CAC score of 100 60.  He denies any chest pain or shortness of breath.  Continued exercise regular without difficulty.  He underwent echocardiogram and pharmacologic myocardial perfusion stress testing.  MPI was low risk with apical anterior and apical lateral apical defect seen only in attenuation correction images suggestive of artifact.  LVEF was normal.  Subsequent echo was read as an LVEF of 30/35%, primary cardiology reviewed echo was more consistent with low normal LVEF.  He was last seen in clinic 04/27/2022 by Dr. Nolan Battle.  He was feeling well other than resolving right calf pain that occurred when he had stopped awkwardly on the curb.  He denied any anginal anginal symptoms.  He was continued on his current medication regimen and no further testing was ordered at that time.  He returns to clinic today stating that he has been doing well.  Denies any chest pain, shortness of breath, lightheadedness dizziness.  Occasionally has some swelling to his bilateral lower extremities.  States that he has been compliant with his current medication regimen with no adverse events.  He continues to remain active and hikes routinely.  Denies any hospitalization or visits to the emergency department.  ROS: 10 point review of systems has been reviewed and considered negative except what is listed in HPI  Studies Reviewed: Aaron Aas   EKG Interpretation Date/Time:  Tuesday July 03 2023  14:56:46 EDT Ventricular Rate:  62 PR Interval:  180 QRS Duration:  142 QT Interval:  474 QTC Calculation: 481 R Axis:   -60  Text Interpretation: Normal sinus rhythm Left axis deviation Left bundle branch block No previous ECGs available Confirmed by Ronald Cockayne ([id number]) on 07/03/2023 3:08:51 PM    TTE (03/10/2022): Normal LV size mild LVH.  LVEF 30-35% (on my review, LVEF closer to 50%).  With grade 1 diastolic dysfunction.  Normal RV size and function.  Mild pulmonary hypertension.  Normal biatrial size.  Mild-moderate tricuspid regurgitation.  Otherwise, no significant valvular abnormalities. Pharmacologic MPI (03/07/2022): Low risk, probably normal study with moderate-sized, mild in severity, reversible defect involving the apical anterior, apical lateral, and apical segments seen only on the attenuation correction images suggestive of artifact.  Mild ischemia cannot be excluded.  No scar identified.  LVEF 55 to 65%.  Coronary artery calcification and aortic atherosclerosis present. Coronary calcium score (01/05/2022): Normal origin of coronary arteries.  Coronary calcium score 1006 AU, with highest degree of calcification involving the LAD.  Emphysematous changes incidentally noted in the visualized lungs. Risk Assessment/Calculations:             Physical Exam:   VS:  BP 118/60 (BP Location: Left Arm)   Pulse 62   Ht 5\' 9"  (1.753 m)   Wt 170 lb (77.1 kg)   SpO2 97%   BMI 25.10 kg/m    Wt Readings from Last 3 Encounters:  07/03/23 170 lb (77.1 kg)  11/15/22 171 lb 8 oz (77.8 kg)  08/08/22 178 lb  6.4 oz (80.9 kg)    GEN: Well nourished, well developed in no acute distress NECK: No JVD; No carotid bruits CARDIAC: RRR, no murmurs, rubs, gallops RESPIRATORY:  Clear to auscultation without rales, wheezing or rhonchi  ABDOMEN: Soft, non-tender, non-distended EXTREMITIES: Trace pretibial edema; No deformity   ASSESSMENT AND PLAN: .   Coronary artery calcification with no angina  reported.  Previous Lexiscan MPI was considered low risk.  EKG today reveals sinus rhythm with rate of 62 with left axis deviation of chronic left bundle branch block with no significant change.  Continue aspirin 81 mg daily and rosuvastatin 10 mg daily to prevent progression of disease.  Chronic left bundle branch block noted on prior EKG and today.  No changes on the EKG were noted.  Will continue to monitor with surveillance studies.  Primary hypertension with a blood pressure of 118/60.  Blood pressure has been well-controlled and has remained stable.  He is continued on amlodipine 10 mg daily and losartan/HCTZ 100/25 mg daily.  He has been encouraged to continue to monitor his pressure 1 to 2 hours postmedication administration at home as well.  Mixed hyperlipidemia with last LDL of 24.  Continue rosuvastatin 10 mg daily.  Ongoing management per PCP.       Dispo: Patient returns clinic CMD/APP in 6 months or sooner if needed  Signed, Harli Engelken, NP

## 2023-07-03 NOTE — Patient Instructions (Signed)
 Medication Instructions:  Your physician recommends that you continue on your current medications as directed. Please refer to the Current Medication list given to you today.  *If you need a refill on your cardiac medications before your next appointment, please call your pharmacy*  Lab Work: No labs ordered today  If you have labs (blood work) drawn today and your tests are completely normal, you will receive your results only by: MyChart Message (if you have MyChart) OR A paper copy in the mail If you have any lab test that is abnormal or we need to change your treatment, we will call you to review the results.  Testing/Procedures: No test ordered today   Follow-Up: At [facility], you and your health needs are our priority.  As part of our continuing mission to provide you with exceptional heart care, our providers are all part of one team.  This team includes your primary Cardiologist (physician) and Advanced Practice Providers or APPs (Physician Assistants and Nurse Practitioners) who all work together to provide you with the care you need, when you need it.  Your next appointment:   6 month(s)  Provider:   You may see Sammy Crisp, MD or one of the following Advanced Practice Providers on your designated Care Team:   Laneta Pintos, NP Gildardo Labrador, PA-C Varney Gentleman, PA-C Cadence Rewey, PA-C Ronald Cockayne, NP Morey Ar, NP    We recommend signing up for the patient portal called "MyChart".  Sign up information is provided on this After Visit Summary.  MyChart is used to connect with patients for Virtual Visits (Telemedicine).  Patients are able to view lab/test results, encounter notes, upcoming appointments, etc.  Non-urgent messages can be sent to your provider as well.   To learn more about what you can do with MyChart, go to [email or web address].

## 2023-08-14 ENCOUNTER — Ambulatory Visit (INDEPENDENT_AMBULATORY_CARE_PROVIDER_SITE_OTHER): Payer: Self-pay

## 2023-08-14 VITALS — BP 148/70 | Ht 69.0 in | Wt 168.5 lb

## 2023-08-14 DIAGNOSIS — Z0000 Encounter for general adult medical examination without abnormal findings: Secondary | ICD-10-CM

## 2023-08-14 NOTE — Progress Notes (Signed)
 Subjective:   Dennis Cooper is a 89 y.o. who presents for a Medicare Wellness preventive visit.  As a reminder, Annual Wellness Visits don't include a physical exam, and some assessments may be limited, especially if this visit is performed virtually. We may recommend an in-person follow-up visit with your provider if needed.  Visit Complete: In person  VideoDeclined- This patient declined Interactive audio and Acupuncturist. Therefore the visit was completed with audio only.  Persons Participating in Visit: Patient.  AWV Questionnaire: No: Patient Medicare AWV questionnaire was not completed prior to this visit.  Cardiac Risk Factors include: advanced age (>57men, >26 women);dyslipidemia;hypertension;male gender     Objective:     Today's Vitals   08/14/23 0809  BP: (!) 148/70  Weight: 168 lb 8 oz (76.4 kg)  Height: 5\' 9"  (1.753 m)   Body mass index is 24.88 kg/m.     08/14/2023    8:20 AM 08/08/2022    8:42 AM 06/28/2021    8:30 AM 06/16/2020    8:34 AM 06/10/2019    9:37 AM 05/27/2018   11:12 AM 10/12/2017    9:30 AM  Advanced Directives  Does Patient Have a Medical Advance Directive? Yes Yes Yes Yes Yes Yes Yes  Type of Estate agent of [place];Living will Healthcare Power of [place];Living will Healthcare Power of [place];Living will Healthcare Power of [place];Living will Healthcare Power of [place];Living will Healthcare Power of [place];Living will Healthcare Power of [place];Living will  Does patient want to make changes to medical advance directive? No - Patient declined  Yes (Inpatient - patient defers changing a medical advance directive and declines information at this time)      Copy of Healthcare Power of Attorney in Chart? Yes - validated most recent copy scanned in chart (See row information)  Yes - validated most recent copy scanned in chart (See row information) Yes - validated most recent copy scanned in chart (See row  information) Yes - validated most recent copy scanned in chart (See row information) No - copy requested No - copy requested    Current Medications (verified) Outpatient Encounter Medications as of 08/14/2023  Medication Sig   amLODipine  (NORVASC ) 10 MG tablet TAKE 1 TABLET BY MOUTH DAILY   aspirin EC 81 MG tablet Take 81 mg by mouth daily.   ketoconazole  (NIZORAL ) 2 % cream Apply 1 Application topically daily. (Patient taking differently: Apply 1 Application topically as needed for irritation.)   losartan -hydrochlorothiazide  (HYZAAR) 100-25 MG tablet TAKE 1 TABLET BY MOUTH DAILY   Multiple Vitamins-Minerals (MULTIVITAMIN ADULT PO) Take 1 tablet by mouth daily.   rosuvastatin  (CRESTOR ) 10 MG tablet TAKE 1 TABLET BY MOUTH EVERY DAY   tadalafil  (CIALIS ) 5 MG tablet Take 1 tablet (5 mg total) by mouth daily.   triamcinolone  (KENALOG ) 0.025 % cream Apply 1 Application topically 2 (two) times daily. (Patient taking differently: Apply 1 Application topically as needed. Skin irritation)   No facility-administered encounter medications on file as of 08/14/2023.    Allergies (verified) Patient has no known allergies.   History: Past Medical History:  Diagnosis Date   Cancer (HCC) 2007   prostate   Erectile dysfunction    History of basal cell cancer 12/27/2017   post neck - nodular   History of chicken pox    History of colonic polyps    History of measles as a child    Hx of basal cell carcinoma 09/06/2017   right medial upper eyelid - nodular  Hypertension    Left bundle branch block (LBBB)    Plantar fasciitis 10/19/2014   Precancerous lesion    Wears dentures    wears upper partial   Wears hearing aid in both ears    has both, only wears left   Past Surgical History:  Procedure Laterality Date   BLEPHAROPLASTY     03/02/2022   CATARACT EXTRACTION W/PHACO Right 03/29/2021   Procedure: CATARACT EXTRACTION PHACO AND INTRAOCULAR LENS PLACEMENT (IOC) RIGHT 4.59 00:37.1;  Surgeon:  Clair Crews, MD;  Location: [facility];  Service: Ophthalmology;  Laterality: Right;   CATARACT EXTRACTION W/PHACO Left 04/12/2021   Procedure: CATARACT EXTRACTION PHACO AND INTRAOCULAR LENS PLACEMENT (IOC) LEFT 8.21 00:51.0;  Surgeon: Clair Crews, MD;  Location: [facility];  Service: Ophthalmology;  Laterality: Left;   CHOLECYSTECTOMY  2010   Dr. Lorel Roes   COLONOSCOPY  04/2008   COLONOSCOPY WITH PROPOFOL  N/A 10/12/2017   Procedure: COLONOSCOPY WITH PROPOFOL ;  Surgeon: Cassie Click, MD;  Location: [facility];  Service: Endoscopy;  Laterality: N/A;   ESOPHAGOGASTRODUODENOSCOPY     HERNIA REPAIR  2010   inguinal; Dr. Lorel Roes   stomach ulcer  1971   Family History  Problem Relation Age of Onset   Heart attack Father 57   Diabetes Sister    Heart disease Sister    Diabetes Sister    Diabetes Sister    Arthritis Sister    Hypertension Sister    Heart attack Brother 70   Congestive Heart Failure Brother    Diabetes Brother    Diabetes Brother    Hypertension Brother    Arthritis Brother    Cancer Brother    Heart attack Brother 17   Social History   Socioeconomic History   Marital status: Married    Spouse name: Not on file   Number of children: 3   Years of education: Not on file   Highest education level: Some college, no degree  Occupational History   Occupation: retired  Tobacco Use   Smoking status: Former    Current packs/day: 0.00    Average packs/day: 2.0 packs/day for 24.0 years (48.0 ttl pk-yrs)    Types: Cigarettes    Start date: 24    Quit date: 1980    Years since quitting: 45.4   Smokeless tobacco: Never  Vaping Use   Vaping status: Never Used  Substance and Sexual Activity   Alcohol use: Not Currently    Comment: Quit 11/06/17   Drug use: No   Sexual activity: Not on file  Other Topics Concern   Not on file  Social History Narrative   Not on file   Social Drivers of Health   Financial Resource Strain: Low  Risk  (08/14/2023)   Overall Financial Resource Strain (CARDIA)    Difficulty of Paying Living Expenses: Not hard at all  Food Insecurity: No Food Insecurity (08/14/2023)   Hunger Vital Sign    Worried About Running Out of Food in the Last Year: Never true    Ran Out of Food in the Last Year: Never true  Transportation Needs: No Transportation Needs (08/14/2023)   PRAPARE - Administrator, Civil Service (Medical): No    Lack of Transportation (Non-Medical): No  Physical Activity: Sufficiently Active (08/14/2023)   Exercise Vital Sign    Days of Exercise per Week: 5 days    Minutes of Exercise per Session: 60 min  Stress: No Stress Concern Present (08/14/2023)  [employer] of Occupational Health - Occupational Stress Questionnaire    Feeling of Stress : Not at all  Social Connections: Moderately Isolated (08/14/2023)   Social Connection and Isolation Panel [NHANES]    Frequency of Communication with Friends and Family: Once a week    Frequency of Social Gatherings with Friends and Family: Three times a week    Attends Religious Services: Never    Active Member of Clubs or Organizations: No    Attends Banker Meetings: Never    Marital Status: Married    Tobacco Counseling Counseling given: Not Answered    Clinical Intake:  Pre-visit preparation completed: Yes  Pain : No/denies pain     BMI - recorded: 24.88 Nutritional Status: BMI of 19-24  Normal Nutritional Risks: None Diabetes: No  No results found for: "HGBA1C"   How often do you need to have someone help you when you read instructions, pamphlets, or other written materials from your doctor or pharmacy?: 1 - Never  Interpreter Needed?: No  Information entered by :: Dellie Fergusson, LPN   Activities of Daily Living    08/14/2023    8:25 AM  In your present state of health, do you have any difficulty performing the following activities:  Hearing? 1  Vision? 0  Difficulty  concentrating or making decisions? 0  Walking or climbing stairs? 0  Dressing or bathing? 0  Doing errands, shopping? 0  Preparing Food and eating ? N  Using the Toilet? N  In the past six months, have you accidently leaked urine? N  Do you have problems with loss of bowel control? N  Managing your Medications? N  Managing your Finances? N  Housekeeping or managing your Housekeeping? N    Patient Care Team: Lamon Pillow, MD as PCP - General (Family Medicine) End, Veryl Gottron, MD as PCP - Cardiology (Cardiology) Elta Halter, MD (Dermatology) [facility], [facility]) Herman, [place], [place] (Optometry) Marquita Situ, Magali Schmitz, MD as Consulting Physician (General Surgery)  Indicate any recent Medical Services you may have received from other than [facility] providers in the past year (date may be approximate).     Assessment:    This is a routine wellness examination for [facility].  Hearing/Vision screen Hearing Screening - Comments:: WEARS AID IN LEFT EAR Vision Screening - Comments:: WEARS GLASSES ALL DAY, HAD CATARACT SGY- [facility]   Goals Addressed             This Visit's Progress    DIET - REDUCE SUGAR INTAKE         Depression Screen     08/14/2023    8:16 AM 11/15/2022    9:10 AM 08/08/2022    8:30 AM 01/10/2022   11:39 AM 11/08/2021    2:08 PM 06/28/2021    8:28 AM 06/16/2020    8:31 AM  PHQ 2/9 Scores  PHQ - 2 Score 0 0 0 0 0 0 0  PHQ- 9 Score 0 2  1 2       Fall Risk     08/14/2023    8:22 AM 11/15/2022    9:10 AM 08/08/2022    8:29 AM 01/10/2022   11:39 AM 11/08/2021    2:09 PM  Fall Risk   Falls in the past year? 0 0 0 1 0  Number falls in past yr: 0 0 0 0 0  Injury with Fall? 0 0 0 1 0  Risk for fall due to : No Fall Risks  No Fall Risks No Fall Risks  No Fall Risks  Follow up Falls evaluation completed Falls evaluation completed Education provided;Falls prevention discussed  Falls evaluation completed    MEDICARE RISK AT HOME:  Medicare  Risk at Home Any stairs in or around the home?: No If so, are there any without handrails?: No Home free of loose throw rugs in walkways, pet beds, electrical cords, etc?: Yes Adequate lighting in your home to reduce risk of falls?: Yes Life alert?: No Use of a cane, walker or w/c?: No Grab bars in the bathroom?: No Shower chair or bench in shower?: Yes Elevated toilet seat or a handicapped toilet?: No  TIMED UP AND GO:  Was the test performed?  Yes  Length of time to ambulate 10 feet: 4 sec Gait steady and fast without use of assistive device  Cognitive Function: 6CIT completed        08/14/2023    8:27 AM 08/08/2022    8:36 AM 06/10/2019    9:42 AM 05/27/2018   11:16 AM 05/23/2017    8:52 AM  6CIT Screen  What Year? 0 points 0 points 0 points 0 points 0 points  What month? 0 points 0 points 0 points 0 points 0 points  What time? 0 points 0 points 0 points 0 points 0 points  Count back from 20 0 points 0 points 0 points 0 points 0 points  Months in reverse 0 points 0 points 2 points 0 points 0 points  Repeat phrase 0 points 0 points 2 points 2 points 2 points  Total Score 0 points 0 points 4 points 2 points 2 points    Immunizations Immunization History  Administered Date(s) Administered   Fluad Quad(high Dose 65+) 12/25/2018, 12/18/2019, 01/12/2021, 01/10/2022   Fluad Trivalent(High Dose 65+) 11/15/2022   Influenza Split 01/11/2011, 12/26/2011   Influenza, High Dose Seasonal PF 12/23/2013, 02/04/2015, 12/18/2015, 12/21/2016, 01/10/2018   Influenza,inj,Quad PF,6+ Mos 12/07/2012   PFIZER(Purple Top)SARS-COV-2 Vaccination 03/25/2019, 04/15/2019, 01/01/2020   PNEUMOCOCCAL CONJUGATE-20 12/09/2022   Pfizer Covid-19 Vaccine Bivalent Booster 15yrs & up 12/18/2020   Pfizer(Comirnaty)Fall Seasonal Vaccine 12 years and older 01/11/2022   Pneumococcal Conjugate-13 05/02/2013   Pneumococcal Polysaccharide-23 06/22/2005   Td 03/20/2004   Tdap 10/04/2011, 12/16/2022   Zoster  Recombinant(Shingrix) 05/16/2020   Zoster, Live 10/04/2011    Screening Tests Health Maintenance  Topic Date Due   Zoster Vaccines- Shingrix (2 of 2) 07/11/2020   Colonoscopy  10/13/2022   COVID-19 Vaccine (6 - 2024-25 season) 11/19/2022   INFLUENZA VACCINE  10/19/2023   Medicare Annual Wellness (AWV)  08/13/2024   DTaP/Tdap/Td (4 - Td or Tdap) 12/15/2032   Pneumonia Vaccine 61+ Years old  Completed   HPV VACCINES  Aged Out   Meningococcal B Vaccine  Aged Out    Health Maintenance  Health Maintenance Due  Topic Date Due   Zoster Vaccines- Shingrix (2 of 2) 07/11/2020   Colonoscopy  10/13/2022   COVID-19 Vaccine (6 - 2024-25 season) 11/19/2022   Health Maintenance Items Addressed: UP TO DATE ON SHOTS EXCEPT COVID; AGED OUT OF COLONOSCOPY  Additional Screening:  Vision Screening: Recommended annual ophthalmology exams for early detection of glaucoma and other disorders of the eye.  Dental Screening: Recommended annual dental exams for proper oral hygiene  Community Resource Referral / Chronic Care Management: CRR required this visit?  No   CCM required this visit?  No   Plan:    I have personally reviewed and noted the following in  the patient's chart:   Medical and social history Use of alcohol, tobacco or illicit drugs  Current medications and supplements including opioid prescriptions. Patient is currently taking opioid prescriptions. Information provided to patient regarding non-opioid alternatives. Patient advised to discuss non-opioid treatment plan with their provider. Functional ability and status Nutritional status Physical activity Advanced directives List of other physicians Hospitalizations, surgeries, and ER visits in previous 12 months Vitals Screenings to include cognitive, depression, and falls Referrals and appointments  In addition, I have reviewed and discussed with patient certain preventive protocols, quality metrics, and best practice  recommendations. A written personalized care plan for preventive services as well as general preventive health recommendations were provided to patient.   Pinky Bright, LPN   4/78/2956   After Visit Summary: (In Person-Declined) Patient declined AVS at this time.  Notes: Nothing significant to report at this time.

## 2023-08-14 NOTE — Patient Instructions (Addendum)
 Dennis Cooper , Thank you for taking time out of your busy schedule to complete your Annual Wellness Visit with me. I enjoyed our conversation and look forward to speaking with you again next year. I, as well as your care team,  appreciate your ongoing commitment to your health goals. Please review the following plan we discussed and let me know if I can assist you in the future.   Follow up Visits: Next Medicare AWV with our clinical staff:   08/19/24 @ 2:30 PM IN PERSON Have you seen your provider in the last 6 months (3 months if uncontrolled diabetes)? Yes   Clinician Recommendations:  Aim for 30 minutes of exercise or brisk walking, 6-8 glasses of water, and 5 servings of fruits and vegetables each day. KEEP IT UP!!      This is a list of the screening recommended for you and due dates:  Health Maintenance  Topic Date Due   Zoster (Shingles) Vaccine (2 of 2) 07/11/2020   Colon Cancer Screening  10/13/2022   COVID-19 Vaccine (6 - 2024-25 season) 11/19/2022   Flu Shot  10/19/2023   Medicare Annual Wellness Visit  08/13/2024   DTaP/Tdap/Td vaccine (4 - Td or Tdap) 12/15/2032   Pneumonia Vaccine  Completed   HPV Vaccine  Aged Out   Meningitis B Vaccine  Aged Out    Advanced directives: (In Chart) A copy of your advanced directives are scanned into your chart should your provider ever need it. Advance Care Planning is important because it:  [x]  Makes sure you receive the medical care that is consistent with your values, goals, and preferences  [x]  It provides guidance to your family and loved ones and reduces their decisional burden about whether or not they are making the right decisions based on your wishes.  Follow the link provided in your after visit summary or read over the paperwork we have mailed to you to help you started getting your Advance Directives in place. If you need assistance in completing these, please reach out to us  so that we can help you!

## 2023-11-08 ENCOUNTER — Other Ambulatory Visit: Payer: Self-pay | Admitting: Family Medicine

## 2023-11-08 ENCOUNTER — Telehealth: Payer: Self-pay

## 2023-11-08 ENCOUNTER — Other Ambulatory Visit (HOSPITAL_COMMUNITY): Payer: Self-pay

## 2023-11-08 NOTE — Telephone Encounter (Signed)
 Pharmacy Patient Advocate Encounter  Received notification from [employer] that Prior Authorization for Tadalafil  5MG  tablets  has been DENIED.  See denial reason below. No denial letter attached in CMM. Will attach denial letter to Media tab once received.   PA #/Case ID/Reference #: [id number]

## 2023-11-08 NOTE — Telephone Encounter (Signed)
 Pharmacy Patient Advocate Encounter   Received notification from Onbase that prior authorization for Tadalafil  5MG  tablets  is required/requested.   Insurance verification completed.   The patient is insured through [facility] .   Per test claim: PA required; PA submitted to above mentioned insurance via Latent Key/confirmation #/EOC [id number] Status is pending

## 2023-11-20 ENCOUNTER — Encounter: Payer: Self-pay | Admitting: Family Medicine

## 2023-12-05 ENCOUNTER — Other Ambulatory Visit: Payer: Self-pay | Admitting: Family Medicine

## 2023-12-05 DIAGNOSIS — I10 Essential (primary) hypertension: Secondary | ICD-10-CM

## 2023-12-14 ENCOUNTER — Encounter: Admitting: Family Medicine

## 2023-12-15 ENCOUNTER — Other Ambulatory Visit: Payer: Self-pay | Admitting: Family Medicine

## 2023-12-26 ENCOUNTER — Ambulatory Visit (INDEPENDENT_AMBULATORY_CARE_PROVIDER_SITE_OTHER): Admitting: Family Medicine

## 2023-12-26 ENCOUNTER — Encounter: Payer: Self-pay | Admitting: Family Medicine

## 2023-12-26 VITALS — BP 126/76 | HR 67 | Resp 16 | Ht 69.0 in | Wt 172.1 lb

## 2023-12-26 DIAGNOSIS — I10 Essential (primary) hypertension: Secondary | ICD-10-CM

## 2023-12-26 DIAGNOSIS — Z0000 Encounter for general adult medical examination without abnormal findings: Secondary | ICD-10-CM | POA: Diagnosis not present

## 2023-12-26 DIAGNOSIS — E785 Hyperlipidemia, unspecified: Secondary | ICD-10-CM | POA: Diagnosis not present

## 2023-12-26 DIAGNOSIS — I2510 Atherosclerotic heart disease of native coronary artery without angina pectoris: Secondary | ICD-10-CM | POA: Diagnosis not present

## 2023-12-26 DIAGNOSIS — Z8546 Personal history of malignant neoplasm of prostate: Secondary | ICD-10-CM

## 2023-12-26 DIAGNOSIS — Z125 Encounter for screening for malignant neoplasm of prostate: Secondary | ICD-10-CM

## 2023-12-26 MED ORDER — TRIAMCINOLONE ACETONIDE 0.025 % EX CREA: 1.0000 | TOPICAL_CREAM | CUTANEOUS | 3 refills | Status: AC | PRN

## 2023-12-26 MED ORDER — KETOCONAZOLE 2 % EX CREA: 1.0000 | TOPICAL_CREAM | Freq: Every day | CUTANEOUS | 2 refills | Status: AC | PRN

## 2023-12-26 NOTE — Patient Instructions (Signed)
 Dennis Cooper  Please review the attached list of medications and notify my office if there are any errors.   . Please bring all of your medications to every appointment so we can make sure that our medication list is the same as yours.

## 2023-12-26 NOTE — Progress Notes (Signed)
 Complete physical exam   Patient: Dennis Cooper   DOB: Oct 26, 1932   89 y.o. Male  MRN: [id number] Visit Date: 12/26/2023  Today's healthcare provider: Nancyann Perry, MD   Chief Complaint  Patient presents with   Annual Exam   Subjective    Discussed the use of AI scribe software for clinical note transcription with the patient, who gave verbal consent to proceed.  History of Present Illness   Dennis Cooper is an 89 year old male who presents for an annual physical exam.  He underwent cataract surgery approximately two years ago. Recently, he has been experiencing difficulty reading small print, which he cannot read without the aid of a magnifying glass. He had an eye examination last week, and the examining doctor indicated that a new prescription would not be beneficial. He is considering seeking a second opinion regarding his vision issues.  He has not experienced any stomach problems.  His daughter and wife both contracted COVID-19, but he did not, despite being in close contact with them. He has never had COVID-19 and believes he may have some immunity.  He is currently using ketoconazole  and triamcinolone  creams, which are to be refilled at [facility] on [address].  He works out at the fitness center and goes to [employer].         Past Medical History:  Diagnosis Date   Cancer [facility]) 2007   prostate   Erectile dysfunction    History of basal cell cancer 12/27/2017   post neck - nodular   History of chicken pox    History of colonic polyps    History of measles as a child    Hx of basal cell carcinoma 09/06/2017   right medial upper eyelid - nodular   Hypertension    Left bundle branch block (LBBB)    Plantar fasciitis 10/19/2014   Precancerous lesion    Wears dentures    wears upper partial   Wears hearing aid in both ears    has both, only wears left   Past Surgical History:  Procedure Laterality Date   BLEPHAROPLASTY     03/02/2022   CATARACT  EXTRACTION W/PHACO Right 03/29/2021   Procedure: CATARACT EXTRACTION PHACO AND INTRAOCULAR LENS PLACEMENT (IOC) RIGHT 4.59 00:37.1;  Surgeon: Jaye Fallow, MD;  Location: [facility];  Service: Ophthalmology;  Laterality: Right;   CATARACT EXTRACTION W/PHACO Left 04/12/2021   Procedure: CATARACT EXTRACTION PHACO AND INTRAOCULAR LENS PLACEMENT (IOC) LEFT 8.21 00:51.0;  Surgeon: Jaye Fallow, MD;  Location: [facility];  Service: Ophthalmology;  Laterality: Left;   CHOLECYSTECTOMY  2010   Dr. Dellie   COLONOSCOPY  04/2008   COLONOSCOPY WITH PROPOFOL  N/A 10/12/2017   Procedure: COLONOSCOPY WITH PROPOFOL ;  Surgeon: Viktoria Lamar DASEN, MD;  Location: [facility];  Service: Endoscopy;  Laterality: N/A;   ESOPHAGOGASTRODUODENOSCOPY     HERNIA REPAIR  2010   inguinal; Dr. Dellie   stomach ulcer  1971   Social History   Socioeconomic History   Marital status: Married    Spouse name: Not on file   Number of children: 3   Years of education: Not on file   Highest education level: Some college, no degree  Occupational History   Occupation: retired  Tobacco Use   Smoking status: Former    Current packs/day: 0.00    Average packs/day: 2.0 packs/day for 24.0 years (48.0 ttl pk-yrs)    Types: Cigarettes    Start date: 2  Quit date: 34    Years since quitting: 45.8   Smokeless tobacco: Never  Vaping Use   Vaping status: Never Used  Substance and Sexual Activity   Alcohol use: Not Currently    Comment: Quit 11/06/17   Drug use: No   Sexual activity: Not on file  Other Topics Concern   Not on file  Social History Narrative   Not on file   Social Drivers of Health   Financial Resource Strain: Low Risk  (08/14/2023)   Overall Financial Resource Strain (CARDIA)    Difficulty of Paying Living Expenses: Not hard at all  Food Insecurity: No Food Insecurity (08/14/2023)   Hunger Vital Sign    Worried About Running Out of Food in the Last Year: Never true     Ran Out of Food in the Last Year: Never true  Transportation Needs: No Transportation Needs (08/14/2023)   PRAPARE - Administrator, Civil Service (Medical): No    Lack of Transportation (Non-Medical): No  Physical Activity: Sufficiently Active (08/14/2023)   Exercise Vital Sign    Days of Exercise per Week: 5 days    Minutes of Exercise per Session: 60 min  Stress: No Stress Concern Present (08/14/2023)   [employer] of Occupational Health - Occupational Stress Questionnaire    Feeling of Stress : Not at all  Social Connections: Moderately Isolated (08/14/2023)   Social Connection and Isolation Panel    Frequency of Communication with Friends and Family: Once a week    Frequency of Social Gatherings with Friends and Family: Three times a week    Attends Religious Services: Never    Active Member of Clubs or Organizations: No    Attends Banker Meetings: Never    Marital Status: Married  Catering manager Violence: Not At Risk (08/14/2023)   Humiliation, Afraid, Rape, and Kick questionnaire    Fear of Current or Ex-Partner: No    Emotionally Abused: No    Physically Abused: No    Sexually Abused: No   Family Status  Relation Name Status   Mother  Deceased at age 28       natural causes   Father  Deceased at age 72       MI   Sister  Deceased at age 7       Fire accident   Sister  Deceased   Sister  Deceased   Brother  Deceased at age 6   Brother  Alive       had a quad-bypass  No partnership data on file   Family History  Problem Relation Age of Onset   Heart attack Father 65   Diabetes Sister    Heart disease Sister    Diabetes Sister    Diabetes Sister    Arthritis Sister    Hypertension Sister    Heart attack Brother 49   Congestive Heart Failure Brother    Diabetes Brother    Diabetes Brother    Hypertension Brother    Arthritis Brother    Cancer Brother    Heart attack Brother 65   No Known Allergies  Patient Care  Team: Gasper Nancyann BRAVO, MD as PCP - General (Family Medicine) End, Lonni, MD as PCP - Cardiology (Cardiology) Hester Alm BROCKS, MD (Dermatology) [place], [facility] (Optometry) [place], [place], [place] (Optometry) Dessa, Reyes ORN, MD as Consulting Physician (General Surgery)   Medications: Outpatient Medications Prior to Visit  Medication Sig   amLODipine  (NORVASC ) 10  MG tablet TAKE 1 TABLET BY MOUTH DAILY   aspirin EC 81 MG tablet Take 81 mg by mouth daily.   losartan -hydrochlorothiazide  (HYZAAR) 100-25 MG tablet TAKE 1 TABLET BY MOUTH DAILY   Multiple Vitamins-Minerals (MULTIVITAMIN ADULT PO) Take 1 tablet by mouth daily.   rosuvastatin  (CRESTOR ) 10 MG tablet TAKE 1 TABLET BY MOUTH EVERY DAY   [DISCONTINUED] ketoconazole  (NIZORAL ) 2 % cream Apply 1 Application topically daily. (Patient taking differently: Apply 1 Application topically as needed for irritation.)   [DISCONTINUED] triamcinolone  (KENALOG ) 0.025 % cream Apply 1 Application topically 2 (two) times daily. (Patient taking differently: Apply 1 Application topically as needed. Skin irritation)   [DISCONTINUED] tadalafil  (CIALIS ) 5 MG tablet TAKE 1 TABLET BY MOUTH DAILY   No facility-administered medications prior to visit.    Review of Systems  Constitutional:  Negative for appetite change, chills and fever.  Respiratory:  Negative for chest tightness, shortness of breath and wheezing.   Cardiovascular:  Negative for chest pain and palpitations.  Gastrointestinal:  Negative for abdominal pain, nausea and vomiting.      Objective    BP 126/76 (BP Location: Left Arm, Patient Position: Sitting, Cuff Size: Normal)   Pulse 67   Resp 16   Ht 5' 9 (1.753 m)   Wt 172 lb 1.6 oz (78.1 kg)   SpO2 97%   BMI 25.41 kg/m    Physical Exam  General Appearance:    Well developed, well nourished male. Alert, cooperative, in no acute distress, appears stated age  Head:    Normocephalic, without obvious abnormality,  atraumatic  Eyes:    PERRL, conjunctiva/corneas clear, EOM's intact, fundi    benign, both eyes       Ears:    Normal TM's and external ear canals, both ears  Nose:   Nares normal, septum midline, mucosa normal, no drainage   or sinus tenderness  Throat:   Lips, mucosa, and tongue normal; teeth and gums normal  Neck:   Supple, symmetrical, trachea midline, no adenopathy;       thyroid :  No enlargement/tenderness/nodules; no carotid   bruit or JVD  Back:     Symmetric, no curvature, ROM normal, no CVA tenderness  Lungs:     Clear to auscultation bilaterally, respirations unlabored  Chest wall:    No tenderness or deformity  Heart:    Normal heart rate. Normal rhythm. No murmurs, rubs, or gallops.  S1 and S2 normal  Abdomen:     Soft, non-tender, bowel sounds active all four quadrants,    no masses, no organomegaly  Genitalia:    deferred  Rectal:    deferred  Extremities:   All extremities are intact. No cyanosis or edema  Pulses:   2+ and symmetric all extremities  Skin:   Skin color, texture, turgor normal, no rashes or lesions  Lymph nodes:   Cervical, supraclavicular, and axillary nodes normal  Neurologic:   CNII-XII intact. Normal strength, sensation and reflexes      throughout       Last depression screening scores    12/26/2023    3:25 PM 08/14/2023    8:16 AM 11/15/2022    9:10 AM  PHQ 2/9 Scores  PHQ - 2 Score 0 0 0  PHQ- 9 Score  0 2   Last fall risk screening    12/26/2023    3:25 PM  Fall Risk   Falls in the past year? 0  Number falls in past yr: 0  Injury  with Fall? 0  Risk for fall due to : No Fall Risks   Last Audit-C alcohol use screening    08/14/2023    8:15 AM  Alcohol Use Disorder Test (AUDIT)  1. How often do you have a drink containing alcohol? 0  2. How many drinks containing alcohol do you have on a typical day when you are drinking? 0  3. How often do you have six or more drinks on one occasion? 0  AUDIT-C Score 0   A score of 3 or more in  women, and 4 or more in men indicates increased risk for alcohol abuse, EXCEPT if all of the points are from question 1   No results found for any visits on 12/26/23.  Assessment & Plan    Routine Health Maintenance and Physical Exam  Exercise Activities and Dietary recommendations  Goals      DIET - EAT MORE FRUITS AND VEGETABLES     DIET - REDUCE SUGAR INTAKE        Immunization History  Administered Date(s) Administered    sv, Bivalent, Protein Subunit Rsvpref,pf (Abrysvo) 11/27/2023   Fluad Quad(high Dose 65+) 12/25/2018, 12/18/2019, 01/12/2021, 01/10/2022   Fluad Trivalent(High Dose 65+) 11/15/2022   INFLUENZA, HIGH DOSE SEASONAL PF 12/23/2013, 02/04/2015, 12/18/2015, 12/21/2016, 01/10/2018, 11/27/2023   Influenza Split 01/11/2011, 12/26/2011   Influenza,inj,Quad PF,6+ Mos 12/07/2012   PFIZER(Purple Top)SARS-COV-2 Vaccination 03/25/2019, 04/15/2019, 01/01/2020   PNEUMOCOCCAL CONJUGATE-20 12/09/2022   Pfizer Covid-19 Vaccine Bivalent Booster 101yrs & up 12/18/2020   Pfizer(Comirnaty)Fall Seasonal Vaccine 12 years and older 01/11/2022   Pneumococcal Conjugate-13 05/02/2013   Pneumococcal Polysaccharide-23 06/22/2005   Td 03/20/2004   Tdap 10/04/2011, 12/16/2022   Zoster Recombinant(Shingrix) 05/16/2020   Zoster, Live 10/04/2011    Health Maintenance  Topic Date Due   Zoster Vaccines- Shingrix (2 of 2) 07/11/2020   Colonoscopy  10/13/2022   COVID-19 Vaccine (6 - 2025-26 season) 11/19/2023   Medicare Annual Wellness (AWV)  08/13/2024   DTaP/Tdap/Td (4 - Td or Tdap) 12/15/2032   Pneumococcal Vaccine: 50+ Years  Completed   Influenza Vaccine  Completed   Meningococcal B Vaccine  Aged Out    Discussed health benefits of physical activity, and encouraged him to engage in regular exercise appropriate for his age and condition.   2. Coronary artery calcification Asymptomatic. Compliant with medication.  Continue aggressive risk factor modification.    3. Primary  hypertension   4. Hyperlipidemia LDL goal <70 He is tolerating rosuvastatin  well with no adverse effects.   - Lipid Panel With LDL/HDL Ratio - Comprehensive metabolic panel with GFR - CBC with Differential/Platelet  5. History of prostate cancer  - PSA  6. Prostate cancer screening  - PSA  refill - ketoconazole  (NIZORAL ) 2 % cream; Apply 1 Application topically daily as needed for irritation.  Dispense: 30 g; Refill: 2 - triamcinolone  (KENALOG ) 0.025 % cream; Apply 1 Application topically as needed. Skin irritation  Dispense: 30 g; Refill: 3     Return in about 1 year (around 12/25/2024).        Nancyann Perry, MD  [facility] [phone] (phone) [phone] (fax)  [facility]

## 2023-12-28 LAB — CBC WITH DIFFERENTIAL/PLATELET
Basophils Absolute: 0.1 x10E3/uL (ref 0.0–0.2)
Basos: 1 %
EOS (ABSOLUTE): 0.1 x10E3/uL (ref 0.0–0.4)
Eos: 2 %
Hematocrit: 46.8 % (ref 37.5–51.0)
Hemoglobin: 15.5 g/dL (ref 13.0–17.7)
Immature Grans (Abs): 0 x10E3/uL (ref 0.0–0.1)
Immature Granulocytes: 0 %
Lymphocytes Absolute: 2.4 x10E3/uL (ref 0.7–3.1)
Lymphs: 29 %
MCH: 31.4 pg (ref 26.6–33.0)
MCHC: 33.1 g/dL (ref 31.5–35.7)
MCV: 95 fL (ref 79–97)
Monocytes Absolute: 0.6 x10E3/uL (ref 0.1–0.9)
Monocytes: 8 %
Neutrophils Absolute: 5 x10E3/uL (ref 1.4–7.0)
Neutrophils: 60 %
Platelets: 173 x10E3/uL (ref 150–450)
RBC: 4.93 x10E6/uL (ref 4.14–5.80)
RDW: 12.1 % (ref 11.6–15.4)
WBC: 8.2 x10E3/uL (ref 3.4–10.8)

## 2023-12-28 LAB — COMPREHENSIVE METABOLIC PANEL WITH GFR
ALT: 31 IU/L (ref 0–44)
AST: 34 IU/L (ref 0–40)
Albumin: 4.5 g/dL (ref 3.7–4.7)
Alkaline Phosphatase: 65 IU/L (ref 48–129)
BUN/Creatinine Ratio: 17 (ref 10–24)
BUN: 18 mg/dL (ref 8–27)
Bilirubin Total: 0.9 mg/dL (ref 0.0–1.2)
CO2: 23 mmol/L (ref 20–29)
Calcium: 9.5 mg/dL (ref 8.6–10.2)
Chloride: 99 mmol/L (ref 96–106)
Creatinine, Ser: 1.03 mg/dL (ref 0.76–1.27)
Globulin, Total: 2.3 g/dL (ref 1.5–4.5)
Glucose: 95 mg/dL (ref 70–99)
Potassium: 4.9 mmol/L (ref 3.5–5.2)
Sodium: 137 mmol/L (ref 134–144)
Total Protein: 6.8 g/dL (ref 6.0–8.5)
eGFR: 70 mL/min/1.73 (ref >59)

## 2023-12-28 LAB — LIPID PANEL WITH LDL/HDL RATIO
Cholesterol, Total: 114 mg/dL (ref 100–199)
HDL: 67 mg/dL (ref >39)
LDL Chol Calc (NIH): 35 mg/dL (ref 0–99)
LDL/HDL Ratio: 0.5 ratio (ref 0.0–3.6)
Triglycerides: 48 mg/dL (ref 0–149)
VLDL Cholesterol Cal: 12 mg/dL (ref 5–40)

## 2023-12-28 LAB — PSA: Prostate Specific Ag, Serum: 4.2 ng/mL — ABNORMAL HIGH (ref 0.0–4.0)

## 2023-12-30 ENCOUNTER — Ambulatory Visit: Payer: Self-pay | Admitting: Family Medicine

## 2024-01-02 ENCOUNTER — Encounter: Payer: Self-pay | Admitting: Cardiology

## 2024-01-02 ENCOUNTER — Ambulatory Visit: Attending: Cardiology | Admitting: Cardiology

## 2024-01-02 VITALS — BP 120/58 | HR 52 | Ht 69.0 in | Wt 172.6 lb

## 2024-01-02 DIAGNOSIS — I447 Left bundle-branch block, unspecified: Secondary | ICD-10-CM | POA: Diagnosis not present

## 2024-01-02 DIAGNOSIS — I10 Essential (primary) hypertension: Secondary | ICD-10-CM | POA: Diagnosis not present

## 2024-01-02 DIAGNOSIS — I2510 Atherosclerotic heart disease of native coronary artery without angina pectoris: Secondary | ICD-10-CM

## 2024-01-02 DIAGNOSIS — E785 Hyperlipidemia, unspecified: Secondary | ICD-10-CM | POA: Diagnosis not present

## 2024-01-02 DIAGNOSIS — R001 Bradycardia, unspecified: Secondary | ICD-10-CM

## 2024-01-02 NOTE — Progress Notes (Signed)
 Cardiology Office Note   Date:  01/02/2024  ID:  Dennis Cooper, DOB Nov 20, 1932, MRN [id number] PCP: Dennis Nancyann BRAVO, MD  [facility] Providers Cardiologist:  Lonni Hanson, MD Cardiology APP:  Gerard Frederick, NP     History of Present Illness Dennis Cooper is a 89 y.o. male with a past medical history of coronary artery calcification, hypertension, chronic left bundle branch block, prostate cancer, who presents today to follow-up on his coronary artery disease.   Previously undergone coronary calcium  score in which revealed severe calcification with a CAC score of 100 60. He denies any chest pain or shortness of breath. Continued exercise regular without difficulty. He underwent echocardiogram and pharmacologic myocardial perfusion stress testing. MPI was low risk with apical anterior and apical lateral apical defect seen only in attenuation correction images suggestive of artifact. LVEF was normal. Subsequent echo was read as an LVEF of 30/35%, primary cardiology reviewed echo was more consistent with low normal LVEF.   Previously had been seen in clinic 04/27/2022 by Dr. Hanson.  He was feeling well from a cardiac perspective.  He denied any anginal or anginal equivalents.  He was continued on his current medication regimen no further testing or changes were made at that time.   He was last seen in clinic in April 2025 stating he had been doing well for the cardiac perspective.  Been compliant with his current medication regimen.  He denies any hospitalizations or visits to the emergency department.  He returns to clinic today stating that he has been doing extremely well from a cardiac perspective.  He states he just followed up with his PCP yesterday and was advised that his PSA level has been elevated.  He was treated for prostate cancer back in 2008 without reoccurrence and this is the first instance that his level has been elevated since that time.  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  He does note occasional swelling around his ankles that is unchanged.  Denies any chest pain, shortness of breath or palpitations.  Denies any lightheadedness or dizziness or syncopal or near syncopal episodes.  States that he has been compliant with his current medication regimen without any undue side effects.  Denies any hospitalizations or visits to the emergency department.  ROS: 10 point review of system has been reviewed and considered negative except ones films in the HPI  Studies Reviewed EKG Interpretation Date/Time:  Wednesday January 02 2024 14:20:28 EDT Ventricular Rate:  52 PR Interval:  178 QRS Duration:  148 QT Interval:  490 QTC Calculation: 455 R Axis:   -54  Text Interpretation: Sinus bradycardia Left axis deviation Left bundle branch block When compared with ECG of 03-Jul-2023 14:56, No significant change was found Confirmed by Gerard Frederick ([id number]) on 01/02/2024 2:40:44 PM    TTE (03/10/2022): Normal LV size mild LVH.  LVEF 30-35% (on my review, LVEF closer to 50%).  With grade 1 diastolic dysfunction.  Normal RV size and function.  Mild pulmonary hypertension.  Normal biatrial size.  Mild-moderate tricuspid regurgitation.  Otherwise, no significant valvular abnormalities. Pharmacologic MPI (03/07/2022): Low risk, probably normal study with moderate-sized, mild in severity, reversible defect involving the apical anterior, apical lateral, and apical segments seen only on the attenuation correction images suggestive of artifact.  Mild ischemia cannot be excluded.  No scar identified.  LVEF 55 to 65%.  Coronary artery calcification and aortic atherosclerosis present. Coronary calcium  score (  01/05/2022): Normal origin of coronary arteries.  Coronary calcium  score 1006 AU, with highest degree of calcification involving the LAD.  Emphysematous changes incidentally noted in the visualized lungs.  Risk  Assessment/Calculations           Physical Exam VS:  BP (!) 120/58   Pulse (!) 52   Ht 5' 9 (1.753 m)   Wt 172 lb 9.6 oz (78.3 kg)   SpO2 95%   BMI 25.49 kg/m        Wt Readings from Last 3 Encounters:  01/02/24 172 lb 9.6 oz (78.3 kg)  12/26/23 172 lb 1.6 oz (78.1 kg)  08/14/23 168 lb 8 oz (76.4 kg)    GEN: Well nourished, well developed in no acute distress NECK: No JVD; No carotid bruits CARDIAC: RRR, no murmurs, rubs, gallops RESPIRATORY:  Clear to auscultation without rales, wheezing or rhonchi  ABDOMEN: Soft, non-tender, non-distended EXTREMITIES: Trace pretibial edema; No deformity   ASSESSMENT AND PLAN Coronary artery calcification we continues to deny any anginal or anginal equivalents.  Prior Lexiscan  Myoview  was considered a low risk scan.  EKG today reveals sinus bradycardia with a rate of 52 with a chronic left bundle branch block and a left axis deviation with no significant ischemic changes noted.  He has continued on aspirin 81 mg daily and rosuvastatin  10 mg daily to prevent progression of disease.  No further ischemic testing needed at this time.  Chronic left bundle branch block noted on EKG.  And will continue to monitor with surveillance studies.  Primary hypertension with a blood pressure of 120/58.  Blood pressures remained stable.  He is continued on amlodipine  10 mg daily and losartan  HCTZ 100/25 mg daily.  He has been encouraged to continue to monitor his pressure 1 to 2 hours postmedication  administration at home as well.  Mixed hyperlipidemia with an LDL of 35.  He is continued on rosuvastatin  10 mg daily.  Ongoing management per PCP.  Sinus bradycardia noted on EKG with rate of 52.  He remains asymptomatic.  He is chronotropically appropriate around the clinic today.  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  He is asymptomatic and we will continue to monitor.       Dispo: Patient to return to clinic to see MD/APP  in 6 months or sooner if needed for further evaluation  Signed, Ryliee Figge, NP

## 2024-01-02 NOTE — Patient Instructions (Signed)
 Medication Instructions:  Your physician recommends that you continue on your current medications as directed. Please refer to the Current Medication list given to you today.   *If you need a refill on your cardiac medications before your next appointment, please call your pharmacy*  Lab Work: No labs ordered today  If you have labs (blood work) drawn today and your tests are completely normal, you will receive your results only by: MyChart Message (if you have MyChart) OR A paper copy in the mail If you have any lab test that is abnormal or we need to change your treatment, we will call you to review the results.  Testing/Procedures: No test ordered today   Follow-Up: At [facility], you and your health needs are our priority.  As part of our continuing mission to provide you with exceptional heart care, our providers are all part of one team.  This team includes your primary Cardiologist (physician) and Advanced Practice Providers or APPs (Physician Assistants and Nurse Practitioners) who all work together to provide you with the care you need, when you need it.  Your next appointment:   6 month(s)  Provider:   You may see Lonni Hanson, MD or one of the following Advanced Practice Providers on your designated Care Team:   Tylene Lunch, NP

## 2024-01-18 ENCOUNTER — Other Ambulatory Visit: Payer: Self-pay | Admitting: Family Medicine

## 2024-01-28 ENCOUNTER — Other Ambulatory Visit: Payer: Self-pay | Admitting: Family Medicine

## 2024-01-28 ENCOUNTER — Encounter: Payer: Self-pay | Admitting: Family Medicine

## 2024-01-28 DIAGNOSIS — R972 Elevated prostate specific antigen [PSA]: Secondary | ICD-10-CM

## 2024-01-30 ENCOUNTER — Ambulatory Visit: Payer: Self-pay | Admitting: Family Medicine

## 2024-01-30 LAB — PSA: Prostate Specific Ag, Serum: 4 ng/mL (ref 0.0–4.0)

## 2024-02-04 ENCOUNTER — Telehealth: Payer: Self-pay | Admitting: Family Medicine

## 2024-02-04 NOTE — Telephone Encounter (Signed)
 Patient is asking for the results of his last PSA on 01/29/24 and what is the next step.   He wants a return call please. He has been waiting a week now.

## 2024-02-07 ENCOUNTER — Ambulatory Visit: Payer: Medicare Other | Admitting: Dermatology

## 2024-02-07 DIAGNOSIS — D229 Melanocytic nevi, unspecified: Secondary | ICD-10-CM

## 2024-02-07 DIAGNOSIS — W908XXA Exposure to other nonionizing radiation, initial encounter: Secondary | ICD-10-CM

## 2024-02-07 DIAGNOSIS — C4491 Basal cell carcinoma of skin, unspecified: Secondary | ICD-10-CM

## 2024-02-07 DIAGNOSIS — L820 Inflamed seborrheic keratosis: Secondary | ICD-10-CM | POA: Diagnosis not present

## 2024-02-07 DIAGNOSIS — L84 Corns and callosities: Secondary | ICD-10-CM

## 2024-02-07 DIAGNOSIS — L821 Other seborrheic keratosis: Secondary | ICD-10-CM

## 2024-02-07 DIAGNOSIS — D489 Neoplasm of uncertain behavior, unspecified: Secondary | ICD-10-CM | POA: Diagnosis not present

## 2024-02-07 DIAGNOSIS — D692 Other nonthrombocytopenic purpura: Secondary | ICD-10-CM

## 2024-02-07 DIAGNOSIS — Z1283 Encounter for screening for malignant neoplasm of skin: Secondary | ICD-10-CM | POA: Diagnosis not present

## 2024-02-07 DIAGNOSIS — L578 Other skin changes due to chronic exposure to nonionizing radiation: Secondary | ICD-10-CM | POA: Diagnosis not present

## 2024-02-07 DIAGNOSIS — Z7189 Other specified counseling: Secondary | ICD-10-CM

## 2024-02-07 DIAGNOSIS — Z85828 Personal history of other malignant neoplasm of skin: Secondary | ICD-10-CM

## 2024-02-07 DIAGNOSIS — C44212 Basal cell carcinoma of skin of right ear and external auricular canal: Secondary | ICD-10-CM | POA: Diagnosis not present

## 2024-02-07 DIAGNOSIS — L570 Actinic keratosis: Secondary | ICD-10-CM | POA: Diagnosis not present

## 2024-02-07 DIAGNOSIS — L814 Other melanin hyperpigmentation: Secondary | ICD-10-CM | POA: Diagnosis not present

## 2024-02-07 DIAGNOSIS — D1801 Hemangioma of skin and subcutaneous tissue: Secondary | ICD-10-CM

## 2024-02-07 HISTORY — DX: Basal cell carcinoma of skin, unspecified: C44.91

## 2024-02-07 NOTE — Progress Notes (Signed)
 Follow-Up Visit   Subjective  Dennis Cooper is a 89 y.o. male who presents for the following: Skin Cancer Screening and Full Body Skin Exam Hx of aks, hx of bcc hx of tinea pedis   Spot on left shoulder noticed a few months ago that bothers him  Also some spot behind right ear that bleeds occasiionaly when showering   The patient presents for Total-Body Skin Exam (TBSE) for skin cancer screening and mole check. The patient has spots, moles and lesions to be evaluated, some may be new or changing and the patient may have concern these could be cancer.    The following portions of the chart were reviewed this encounter and updated as appropriate: medications, allergies, medical history  Review of Systems:  No other skin or systemic complaints except as noted in HPI or Assessment and Plan.  Objective  Well appearing patient in no apparent distress; mood and affect are within normal limits.  A full examination was performed including scalp, head, eyes, ears, nose, lips, neck, chest, axillae, abdomen, back, buttocks, bilateral upper extremities, bilateral lower extremities, hands, feet, fingers, toes, fingernails, and toenails. All findings within normal limits unless otherwise noted below.   Relevant physical exam findings are noted in the Assessment and Plan.  right posterior ear 0.6 cm pearly papule   face, ears, and arms x 6 (6) Erythematous thin papules/macules with gritty scale.  left deltoid x 1 Erythematous stuck-on, waxy papule or plaque  Assessment & Plan   HISTORY OF BASAL CELL CARCINOMA OF THE SKIN 09/06/2017 right medial upper eyelid - nodular  12/27/2017 posterior neck - nodular  - No evidence of recurrence today - Recommend regular full body skin exams - Recommend daily broad spectrum sunscreen SPF 30+ to sun-exposed areas, reapply every 2 hours as needed.  - Call if any new or changing lesions are noted between office visits   SKIN CANCER SCREENING PERFORMED  TODAY.  ACTINIC DAMAGE - Chronic condition, secondary to cumulative UV/sun exposure - diffuse scaly erythematous macules with underlying dyspigmentation - Recommend daily broad spectrum sunscreen SPF 30+ to sun-exposed areas, reapply every 2 hours as needed.  - Staying in the shade or wearing long sleeves, sun glasses (UVA+UVB protection) and wide brim hats (4-inch brim around the entire circumference of the hat) are also recommended for sun protection.  - Call for new or changing lesions.  LENTIGINES, SEBORRHEIC KERATOSES, HEMANGIOMAS - Benign normal skin lesions - Benign-appearing - Call for any changes  MELANOCYTIC NEVI - Tan-brown and/or pink-flesh-colored symmetric macules and papules - Benign appearing on exam today - Observation - Call clinic for new or changing moles - Recommend daily use of broad spectrum spf 30+ sunscreen to sun-exposed areas.   Purpura - Chronic; persistent and recurrent.  Treatable, but not curable. - Violaceous macules and patches - Benign - Related to trauma, age, sun damage and/or use of blood thinners, chronic use of topical and/or oral steroids - Observe - Can use OTC arnica containing moisturizer such as Dermend Bruise Formula if desired - Call for worsening or other concerns  Callus on Left lateral sole of left foot  Exam: thickened skin on left lateral sole of left foot  Treatment Plan: Discussed Podiatry referral  Patient would like to consider and will reach out to use if he would like referral   NEOPLASM OF UNCERTAIN BEHAVIOR right posterior ear Epidermal / dermal shaving  Lesion diameter (cm):  0.6 Informed consent: discussed and consent obtained   Timeout:  patient name, date of birth, surgical site, and procedure verified   Procedure prep:  Patient was prepped and draped in usual sterile fashion Prep type:  Isopropyl alcohol Anesthesia: the lesion was anesthetized in a standard fashion   Anesthetic:  1% lidocaine  w/ epinephrine   1-100,000 buffered w/ 8.4% NaHCO3 Instrument used: flexible razor blade   Hemostasis achieved with: pressure, aluminum chloride and electrodesiccation   Outcome: patient tolerated procedure well   Post-procedure details: sterile dressing applied and wound care instructions given   Dressing type: bandage and petrolatum    Destruction of lesion Complexity: extensive   Destruction method: electrodesiccation and curettage   Informed consent: discussed and consent obtained   Timeout:  patient name, date of birth, surgical site, and procedure verified Procedure prep:  Patient was prepped and draped in usual sterile fashion Prep type:  Isopropyl alcohol Anesthesia: the lesion was anesthetized in a standard fashion   Anesthetic:  1% lidocaine  w/ epinephrine  1-100,000 buffered w/ 8.4% NaHCO3 Curettage performed in three different directions: Yes   Electrodesiccation performed over the curetted area: Yes   Final wound size (cm):  1.2 Hemostasis achieved with:  pressure, aluminum chloride and electrodesiccation Outcome: patient tolerated procedure well with no complications   Post-procedure details: sterile dressing applied and wound care instructions given   Dressing type: bandage and petrolatum    Specimen 1 - Surgical pathology Differential Diagnosis: r/o bcc  ED&C today  Check Margins: No ACTINIC KERATOSIS (6) face, ears, and arms x 6 (6) Patient advised to call or send mychart if spots are not resolved after 2 months  Actinic keratoses are precancerous spots that appear secondary to cumulative UV radiation exposure/sun exposure over time. They are chronic with expected duration over 1 year. A portion of actinic keratoses will progress to squamous cell carcinoma of the skin. It is not possible to reliably predict which spots will progress to skin cancer and so treatment is recommended to prevent development of skin cancer.  Recommend daily broad spectrum sunscreen SPF 30+ to sun-exposed  areas, reapply every 2 hours as needed.  Recommend staying in the shade or wearing long sleeves, sun glasses (UVA+UVB protection) and wide brim hats (4-inch brim around the entire circumference of the hat). Call for new or changing lesions. Destruction of lesion - face, ears, and arms x 6 (6) Complexity: simple   Destruction method: cryotherapy   Informed consent: discussed and consent obtained   Timeout:  patient name, date of birth, surgical site, and procedure verified Lesion destroyed using liquid nitrogen: Yes   Region frozen until ice ball extended beyond lesion: Yes   Outcome: patient tolerated procedure well with no complications   Post-procedure details: wound care instructions given    INFLAMED SEBORRHEIC KERATOSIS left deltoid x 1  Symptomatic, irritating, patient would like treated. Destruction of lesion - left deltoid x 1 Complexity: simple   Destruction method: cryotherapy   Informed consent: discussed and consent obtained   Timeout:  patient name, date of birth, surgical site, and procedure verified Lesion destroyed using liquid nitrogen: Yes   Region frozen until ice ball extended beyond lesion: Yes   Outcome: patient tolerated procedure well with no complications   Post-procedure details: wound care instructions given    Return in about 1 year (around 02/06/2025) for TBSE.  IEleanor Blush, CMA, am acting as scribe for Alm Rhyme, MD.   Documentation: I have reviewed the above documentation for accuracy and completeness, and I agree with the above.  Alm Rhyme,  MD

## 2024-02-07 NOTE — Patient Instructions (Addendum)
 Electrodesiccation and Curettage ("Scrape and Burn") Wound Care Instructions  Leave the original bandage on for 24 hours if possible.  If the bandage becomes soaked or soiled before that time, it is OK to remove it and examine the wound.  A small amount of post-operative bleeding is normal.  If excessive bleeding occurs, remove the bandage, place gauze over the site and apply continuous pressure (no peeking) over the area for 30 minutes. If this does not work, please call our clinic as soon as possible or page your doctor if it is after hours.   Once a day, cleanse the wound with soap and water . It is fine to shower. If a thick crust develops you may use a Q-tip dipped into dilute hydrogen peroxide (mix 1:1 with water ) to dissolve it.  Hydrogen peroxide can slow the healing process, so use it only as needed.    After washing, apply petroleum jelly (Vaseline) or an antibiotic ointment if your doctor prescribed one for you, followed by a bandage.    For best healing, the wound should be covered with a layer of ointment at all times. If you are not able to keep the area covered with a bandage to hold the ointment in place, this may mean re-applying the ointment several times a day.  Continue this wound care until the wound has healed and is no longer open. It may take several weeks for the wound to heal and close.  Itching and mild discomfort is normal during the healing process.  If you have any discomfort, you can take Tylenol  (acetaminophen ) or ibuprofen as directed on the bottle. (Please do not take these if you have an allergy to them or cannot take them for another reason).  Some redness, tenderness and white or yellow material in the wound is normal healing.  If the area becomes very sore and red, or develops a thick yellow-green material (pus), it may be infected; please notify us .    Wound healing continues for up to one year following surgery. It is not unusual to experience pain in the scar  from time to time during the interval.  If the pain becomes severe or the scar thickens, you should notify the office.    A slight amount of redness in a scar is expected for the first six months.  After six months, the redness will fade and the scar will soften and fade.  The color difference becomes less noticeable with time.  If there are any problems, return for a post-op surgery check at your earliest convenience.  To improve the appearance of the scar, you can use silicone scar gel, cream, or sheets (such as Mederma or Serica) every night for up to one year. These are available over the counter (without a prescription).  Please call our office at [phone] for any questions or concerns.   Biopsy Wound Care Instructions  Leave the original bandage on for 24 hours if possible.  If the bandage becomes soaked or soiled before that time, it is OK to remove it and examine the wound.  A small amount of post-operative bleeding is normal.  If excessive bleeding occurs, remove the bandage, place gauze over the site and apply continuous pressure (no peeking) over the area for 30 minutes. If this does not work, please call our clinic as soon as possible or page your doctor if it is after hours.   Once a day, cleanse the wound with soap and water . It is fine to shower.  If a thick crust develops you may use a Q-tip dipped into dilute hydrogen peroxide (mix 1:1 with water ) to dissolve it.  Hydrogen peroxide can slow the healing process, so use it only as needed.    After washing, apply petroleum jelly (Vaseline) or an antibiotic ointment if your doctor prescribed one for you, followed by a bandage.    For best healing, the wound should be covered with a layer of ointment at all times. If you are not able to keep the area covered with a bandage to hold the ointment in place, this may mean re-applying the ointment several times a day.  Continue this wound care until the wound has healed and is no longer  open.   Itching and mild discomfort is normal during the healing process. However, if you develop pain or severe itching, please call our office.   If you have any discomfort, you can take Tylenol  (acetaminophen ) or ibuprofen as directed on the bottle. (Please do not take these if you have an allergy to them or cannot take them for another reason).  Some redness, tenderness and white or yellow material in the wound is normal healing.  If the area becomes very sore and red, or develops a thick yellow-green material (pus), it may be infected; please notify us .    If you have stitches, return to clinic as directed to have the stitches removed. You will continue wound care for 2-3 days after the stitches are removed.   Wound healing continues for up to one year following surgery. It is not unusual to experience pain in the scar from time to time during the interval.  If the pain becomes severe or the scar thickens, you should notify the office.    A slight amount of redness in a scar is expected for the first six months.  After six months, the redness will fade and the scar will soften and fade.  The color difference becomes less noticeable with time.  If there are any problems, return for a post-op surgery check at your earliest convenience.  To improve the appearance of the scar, you can use silicone scar gel, cream, or sheets (such as Mederma or Serica) every night for up to one year. These are available over the counter (without a prescription).  Please call our office at [phone] for any questions or concerns.      Actinic keratoses are precancerous spots that appear secondary to cumulative UV radiation exposure/sun exposure over time. They are chronic with expected duration over 1 year. A portion of actinic keratoses will progress to squamous cell carcinoma of the skin. It is not possible to reliably predict which spots will progress to skin cancer and so treatment is recommended to  prevent development of skin cancer.  Recommend daily broad spectrum sunscreen SPF 30+ to sun-exposed areas, reapply every 2 hours as needed.  Recommend staying in the shade or wearing long sleeves, sun glasses (UVA+UVB protection) and wide brim hats (4-inch brim around the entire circumference of the hat). Call for new or changing lesions.   Cryotherapy Aftercare  Wash gently with soap and water  everyday.   Apply Vaseline and Band-Aid daily until healed.   Seborrheic Keratosis  What causes seborrheic keratoses? Seborrheic keratoses are harmless, common skin growths that first appear during adult life.  As time goes by, more growths appear.  Some people may develop a large number of them.  Seborrheic keratoses appear on both covered and uncovered body parts.  They  are not caused by sunlight.  The tendency to develop seborrheic keratoses can be inherited.  They vary in color from skin-colored to gray, brown, or even black.  They can be either smooth or have a rough, warty surface.   Seborrheic keratoses are superficial and look as if they were stuck on the skin.  Under the microscope this type of keratosis looks like layers upon layers of skin.  That is why at times the top layer may seem to fall off, but the rest of the growth remains and re-grows.    Treatment Seborrheic keratoses do not need to be treated, but can easily be removed in the office.  Seborrheic keratoses often cause symptoms when they rub on clothing or jewelry.  Lesions can be in the way of shaving.  If they become inflamed, they can cause itching, soreness, or burning.  Removal of a seborrheic keratosis can be accomplished by freezing, burning, or surgery. If any spot bleeds, scabs, or grows rapidly, please return to have it checked, as these can be an indication of a skin cancer.    Melanoma ABCDEs  Melanoma is the most dangerous type of skin cancer, and is the leading cause of death from skin disease.  You are more likely  to develop melanoma if you: Have light-colored skin, light-colored eyes, or red or blond hair Spend a lot of time in the sun Tan regularly, either outdoors or in a tanning bed Have had blistering sunburns, especially during childhood Have a close family member who has had a melanoma Have atypical moles or large birthmarks  Early detection of melanoma is key since treatment is typically straightforward and cure rates are extremely high if we catch it early.   The first sign of melanoma is often a change in a mole or a new dark spot.  The ABCDE system is a way of remembering the signs of melanoma.  A for asymmetry:  The two halves do not match. B for border:  The edges of the growth are irregular. C for color:  A mixture of colors are present instead of an even brown color. D for diameter:  Melanomas are usually (but not always) greater than 6mm - the size of a pencil eraser. E for evolution:  The spot keeps changing in size, shape, and color.  Please check your skin once per month between visits. You can use a small mirror in front and a large mirror behind you to keep an eye on the back side or your body.   If you see any new or changing lesions before your next follow-up, please call to schedule a visit.  Please continue daily skin protection including broad spectrum sunscreen SPF 30+ to sun-exposed areas, reapplying every 2 hours as needed when you're outdoors.   Staying in the shade or wearing long sleeves, sun glasses (UVA+UVB protection) and wide brim hats (4-inch brim around the entire circumference of the hat) are also recommended for sun protection.    Due to recent changes in healthcare laws, you may see results of your pathology and/or laboratory studies on MyChart before the doctors have had a chance to review them. We understand that in some cases there may be results that are confusing or concerning to you. Please understand that not all results are received at the same time  and often the doctors may need to interpret multiple results in order to provide you with the best plan of care or course of treatment. Therefore, we ask  that you please give us  2 business days to thoroughly review all your results before contacting the office for clarification. Should we see a critical lab result, you will be contacted sooner.   If You Need Anything After Your Visit  If you have any questions or concerns for your doctor, please call our main line at [phone] and press option 4 to reach your doctor's medical assistant. If no one answers, please leave a voicemail as directed and we will return your call as soon as possible. Messages left after 4 pm will be answered the following business day.   You may also send us  a message via MyChart. We typically respond to MyChart messages within 1-2 business days.  For prescription refills, please ask your pharmacy to contact our office. Our fax number is [phone].  If you have an urgent issue when the clinic is closed that cannot wait until the next business day, you can page your doctor at the number below.    Please note that while we do our best to be available for urgent issues outside of office hours, we are not available 24/7.   If you have an urgent issue and are unable to reach us , you may choose to seek medical care at your doctor's office, retail clinic, urgent care center, or emergency room.  If you have a medical emergency, please immediately call 911 or go to the emergency department.  Pager Numbers  - Dr. Hester: [phone]  - Dr. Jackquline: [phone]  - Dr. Claudene: [phone]   - Dr. Raymund: [phone]  In the event of inclement weather, please call our main line at [phone] for an update on the status of any delays or closures.  Dermatology Medication Tips: Please keep the boxes that topical medications come in in order to help keep track of the instructions about where and how to use these.  Pharmacies typically print the medication instructions only on the boxes and not directly on the medication tubes.   If your medication is too expensive, please contact our office at [phone] option 4 or send us  a message through MyChart.   We are unable to tell what your co-pay for medications will be in advance as this is different depending on your insurance coverage. However, we may be able to find a substitute medication at lower cost or fill out paperwork to get insurance to cover a needed medication.   If a prior authorization is required to get your medication covered by your insurance company, please allow us  1-2 business days to complete this process.  Drug prices often vary depending on where the prescription is filled and some pharmacies may offer cheaper prices.  The website www.goodrx.com contains coupons for medications through different pharmacies. The prices here do not account for what the cost may be with help from insurance (it may be cheaper with your insurance), but the website can give you the price if you did not use any insurance.  - You can print the associated coupon and take it with your prescription to the pharmacy.  - You may also stop by our office during regular business hours and pick up a GoodRx coupon card.  - If you need your prescription sent electronically to a different pharmacy, notify our office through [facility] or by phone at [phone] option 4.     Si Usted Necesita Algo Despus de Su Visita  Tambin puede enviarnos un mensaje a travs de Clinical Cytogeneticist. Por lo general respondemos a los  mensajes de MyChart en el transcurso de 1 a 2 das hbiles.  Para renovar recetas, por favor pida a su farmacia que se ponga en contacto con nuestra oficina. Randi lakes de fax es [place] [phone].  Si tiene un asunto urgente cuando la clnica est cerrada y que no puede esperar hasta el siguiente da hbil, puede llamar/localizar a su doctor(a) al nmero  que aparece a continuacin.   Por favor, tenga en cuenta que aunque hacemos todo lo posible para estar disponibles para asuntos urgentes fuera del horario de [place], no estamos disponibles las 24 horas del da, los 7 [address]  [address] de la [place].   Si tiene un problema urgente y no puede comunicarse con nosotros, puede optar por buscar atencin mdica  en el consultorio de su doctor(a), en una clnica privada, en un centro de atencin urgente o en una sala de emergencias.  Si tiene engineer, drilling, por favor llame inmediatamente al 911 o vaya a la sala de emergencias.  Nmeros de bper  - Dr. Hester: [phone]  - Dra. Jackquline: [phone]  - Dr. Claudene: [phone]  - Dra. Kitts: [phone]  En caso de inclemencias del [place], por favor llame a nuestra lnea principal al [phone] para una actualizacin sobre el estado de cualquier retraso o cierre.  Consejos para la medicacin en dermatologa: Por favor, guarde las cajas en las que vienen los medicamentos de uso tpico para ayudarle a seguir las instrucciones sobre dnde y cmo usarlos. Las farmacias generalmente imprimen las instrucciones del medicamento slo en las cajas y no directamente en los tubos del [place].   Si su medicamento es muy caro, por favor, pngase en contacto con landry rieger llamando al [phone] y presione la opcin 4 o envenos un mensaje a travs de Clinical Cytogeneticist.   No podemos decirle cul ser su copago por los medicamentos por adelantado ya que esto es diferente dependiendo de la cobertura de su seguro. Sin embargo, es posible que podamos encontrar un medicamento sustituto a audiological scientist un formulario para que el seguro cubra el medicamento que se considera necesario.   Si se requiere una autorizacin previa para que su compaa de seguros cubra su medicamento, por favor permtanos de 1 a 2 das hbiles para completar este proceso.  Los precios de los medicamentos varan con frecuencia  dependiendo del environmental consultant de dnde se surte la receta y alguna farmacias pueden ofrecer precios ms baratos.  El sitio web www.goodrx.com tiene cupones para medicamentos de health and safety inspector. Los precios aqu no tienen en cuenta lo que podra costar con la ayuda del seguro (puede ser ms barato con su seguro), pero el sitio web puede darle el precio si no utiliz tourist information centre manager.  - Puede imprimir el cupn correspondiente y llevarlo con su receta a la farmacia.  - Tambin puede pasar por nuestra oficina durante el horario de atencin regular y education officer, museum una tarjeta de cupones de GoodRx.  - Si necesita que su receta se enve electrnicamente a una farmacia diferente, informe a nuestra oficina a travs de MyChart de [place] o por telfono llamando al [phone] y presione la opcin 4.

## 2024-02-07 NOTE — Telephone Encounter (Signed)
 His PSA started to come down to 4.0. Recommend rechecking in 5-6 months. Will contact him around the end of April to recheck.

## 2024-02-08 LAB — SURGICAL PATHOLOGY

## 2024-02-08 NOTE — Telephone Encounter (Signed)
 Called and advised patient, understood and will contact us  with any further concerns.

## 2024-02-09 ENCOUNTER — Ambulatory Visit: Payer: Self-pay | Admitting: Dermatology

## 2024-02-11 ENCOUNTER — Encounter: Payer: Self-pay | Admitting: Dermatology

## 2024-02-11 NOTE — Telephone Encounter (Addendum)
 Called and discussed results with patient. He verbalized understanding and denied further questions. Will recheck area at next follow up.   ----- Message from Alm Rhyme sent at 02/09/2024  8:06 PM EST ----- FINAL DIAGNOSIS        1. Skin, right posterior ear :       BASAL CELL CARCINOMA, NODULAR AND INFILTRATIVE PATTERNS    Cancer = BCC Already treated Recheck next visit ----- Message ----- From: Interface, Lab In [facility] Sent: 02/08/2024   3:30 PM EST To: Alm JAYSON Rhyme, MD

## 2024-03-31 ENCOUNTER — Other Ambulatory Visit: Payer: Self-pay | Admitting: Family Medicine

## 2024-03-31 DIAGNOSIS — I10 Essential (primary) hypertension: Secondary | ICD-10-CM

## 2024-06-11 ENCOUNTER — Ambulatory Visit: Admitting: Internal Medicine

## 2024-08-19 ENCOUNTER — Ambulatory Visit

## 2024-12-26 ENCOUNTER — Encounter: Admitting: Family Medicine

## 2025-02-17 ENCOUNTER — Ambulatory Visit: Admitting: Dermatology
